# Patient Record
Sex: Female | Born: 1960 | Race: White | Hispanic: No | Marital: Married | Smoking: Never smoker
Health system: Southern US, Community
[De-identification: ages and names within clinical notes are randomized; demographics above are authoritative.]

## PROBLEM LIST (undated history)

## (undated) DIAGNOSIS — I2510 Atherosclerotic heart disease of native coronary artery without angina pectoris: Secondary | ICD-10-CM

## (undated) DIAGNOSIS — R51 Headache: Secondary | ICD-10-CM

## (undated) DIAGNOSIS — D239 Other benign neoplasm of skin, unspecified: Secondary | ICD-10-CM

## (undated) DIAGNOSIS — J329 Chronic sinusitis, unspecified: Secondary | ICD-10-CM

## (undated) DIAGNOSIS — I5022 Chronic systolic (congestive) heart failure: Secondary | ICD-10-CM

## (undated) DIAGNOSIS — F4321 Adjustment disorder with depressed mood: Secondary | ICD-10-CM

## (undated) DIAGNOSIS — E782 Mixed hyperlipidemia: Secondary | ICD-10-CM

## (undated) DIAGNOSIS — K219 Gastro-esophageal reflux disease without esophagitis: Secondary | ICD-10-CM

## (undated) DIAGNOSIS — I10 Essential (primary) hypertension: Secondary | ICD-10-CM

## (undated) DIAGNOSIS — E668 Other obesity: Secondary | ICD-10-CM

## (undated) DIAGNOSIS — M545 Low back pain: Secondary | ICD-10-CM

## (undated) DIAGNOSIS — G4762 Sleep related leg cramps: Secondary | ICD-10-CM

## (undated) DIAGNOSIS — I428 Other cardiomyopathies: Secondary | ICD-10-CM

## (undated) HISTORY — DX: Gastro-esophageal reflux disease without esophagitis: K21.9

## (undated) HISTORY — DX: Atherosclerotic heart disease of native coronary artery without angina pectoris: I25.10

## (undated) HISTORY — DX: Low back pain: M54.5

## (undated) HISTORY — DX: Other obesity: E66.8

## (undated) HISTORY — DX: Headache: R51

## (undated) HISTORY — DX: Sleep related leg cramps: G47.62

## (undated) HISTORY — DX: Mixed hyperlipidemia: E78.2

## (undated) HISTORY — DX: Chronic systolic (congestive) heart failure: I50.22

## (undated) HISTORY — DX: Other benign neoplasm of skin, unspecified: D23.9

## (undated) HISTORY — PX: ABDOMINAL HYSTERECTOMY: SHX81

## (undated) HISTORY — DX: Other cardiomyopathies: I42.8

## (undated) HISTORY — DX: Adjustment disorder with depressed mood: F43.21

## (undated) HISTORY — DX: Chronic sinusitis, unspecified: J32.9

---
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
## 1998-10-05 ENCOUNTER — Encounter: Payer: Self-pay | Admitting: Physical Medicine and Rehabilitation

## 1998-10-05 ENCOUNTER — Ambulatory Visit (HOSPITAL_COMMUNITY)
Admission: RE | Admit: 1998-10-05 | Discharge: 1998-10-05 | Payer: Self-pay | Admitting: Physical Medicine and Rehabilitation

## 2003-04-23 ENCOUNTER — Other Ambulatory Visit: Payer: Self-pay

## 2006-02-10 DIAGNOSIS — E669 Obesity, unspecified: Secondary | ICD-10-CM

## 2006-02-10 DIAGNOSIS — R519 Headache, unspecified: Secondary | ICD-10-CM

## 2006-02-10 DIAGNOSIS — E668 Other obesity: Secondary | ICD-10-CM

## 2006-02-10 HISTORY — DX: Obesity, unspecified: E66.9

## 2006-02-10 HISTORY — DX: Other obesity: E66.8

## 2006-02-10 HISTORY — DX: Headache, unspecified: R51.9

## 2006-06-09 ENCOUNTER — Other Ambulatory Visit: Payer: Self-pay

## 2006-06-09 ENCOUNTER — Emergency Department: Payer: Self-pay | Admitting: Emergency Medicine

## 2006-11-14 DIAGNOSIS — D239 Other benign neoplasm of skin, unspecified: Secondary | ICD-10-CM

## 2006-11-14 DIAGNOSIS — F4321 Adjustment disorder with depressed mood: Secondary | ICD-10-CM

## 2006-11-14 HISTORY — DX: Other benign neoplasm of skin, unspecified: D23.9

## 2006-11-14 HISTORY — DX: Adjustment disorder with depressed mood: F43.21

## 2007-01-15 DIAGNOSIS — M545 Low back pain, unspecified: Secondary | ICD-10-CM

## 2007-01-15 HISTORY — DX: Low back pain, unspecified: M54.50

## 2007-05-24 ENCOUNTER — Ambulatory Visit: Payer: Self-pay

## 2008-12-24 ENCOUNTER — Emergency Department: Payer: Self-pay | Admitting: Emergency Medicine

## 2009-01-14 LAB — LIPID PANEL
Cholesterol: 182 mg/dL (ref 0–200)
HDL: 70 mg/dL (ref 35–70)
LDL Cholesterol: 94 mg/dL
Triglycerides: 89 mg/dL (ref 40–160)

## 2009-01-14 LAB — TSH: TSH: 1.03 u[IU]/mL (ref <=5.90)

## 2009-01-14 LAB — HEMOGLOBIN A1C: Hgb A1c MFr Bld: 5.7 % (ref 4.0–6.0)

## 2010-12-14 LAB — BASIC METABOLIC PANEL
BUN: 17 mg/dL (ref 4–21)
Creatinine: 0.7 mg/dL (ref 0.5–1.1)
GLUCOSE: 87 mg/dL
Potassium: 4 mmol/L (ref 3.4–5.3)
SODIUM: 141 mmol/L (ref 137–147)

## 2010-12-14 LAB — CBC AND DIFFERENTIAL
HEMATOCRIT: 39 % (ref 36–46)
Hemoglobin: 12.9 g/dL (ref 12.0–16.0)
NEUTROS ABS: 5 /uL
Platelets: 249 10*3/uL (ref 150–399)
WBC: 9.2 10*3/mL

## 2011-09-08 ENCOUNTER — Ambulatory Visit: Payer: Self-pay | Admitting: Family Medicine

## 2012-05-07 ENCOUNTER — Emergency Department: Payer: Self-pay | Admitting: Emergency Medicine

## 2012-10-17 ENCOUNTER — Other Ambulatory Visit: Payer: Self-pay

## 2012-10-17 ENCOUNTER — Emergency Department (HOSPITAL_COMMUNITY)
Admission: EM | Admit: 2012-10-17 | Discharge: 2012-10-17 | Disposition: A | Discharge status: Home / Self Care | Payer: Self-pay | Attending: Emergency Medicine | Admitting: Emergency Medicine

## 2012-10-17 ENCOUNTER — Emergency Department (HOSPITAL_COMMUNITY): Payer: Self-pay

## 2012-10-17 ENCOUNTER — Encounter (HOSPITAL_COMMUNITY): Payer: Self-pay | Admitting: Emergency Medicine

## 2012-10-17 DIAGNOSIS — R0789 Other chest pain: Secondary | ICD-10-CM | POA: Insufficient documentation

## 2012-10-17 DIAGNOSIS — R51 Headache: Secondary | ICD-10-CM | POA: Insufficient documentation

## 2012-10-17 LAB — BASIC METABOLIC PANEL
BUN: 20 mg/dL (ref 6–23)
CO2: 27 mEq/L (ref 19–32)
Calcium: 9.4 mg/dL (ref 8.4–10.5)
Chloride: 101 mEq/L (ref 96–112)
Creatinine, Ser: 0.66 mg/dL (ref 0.50–1.10)
GFR calc Af Amer: >90 mL/min (ref >90)
GFR calc non Af Amer: >90 mL/min (ref >90)
Glucose, Bld: 94 mg/dL (ref 70–99)
Potassium: 3.5 mEq/L (ref 3.5–5.1)
Sodium: 137 mEq/L (ref 135–145)

## 2012-10-17 LAB — URINALYSIS, ROUTINE W REFLEX MICROSCOPIC
Bilirubin Urine: NEGATIVE
Glucose, UA: NEGATIVE mg/dL
Hgb urine dipstick: NEGATIVE
Ketones, ur: NEGATIVE mg/dL
Nitrite: NEGATIVE
Protein, ur: NEGATIVE mg/dL
Specific Gravity, Urine: >1.03 — ABNORMAL HIGH (ref 1.005–1.030)
Urobilinogen, UA: 0.2 mg/dL (ref 0.0–1.0)

## 2012-10-17 LAB — CBC WITH DIFFERENTIAL/PLATELET
Basophils Absolute: 0 10*3/uL (ref 0.0–0.1)
Basophils Relative: 0 % (ref 0–1)
Eosinophils Absolute: 0.2 10*3/uL (ref 0.0–0.7)
Eosinophils Relative: 2 % (ref 0–5)
HCT: 36.9 % (ref 36.0–46.0)
Hemoglobin: 12.2 g/dL (ref 12.0–15.0)
Lymphocytes Relative: 35 % (ref 12–46)
Lymphs Abs: 4.1 10*3/uL — ABNORMAL HIGH (ref 0.7–4.0)
MCH: 28.4 pg (ref 26.0–34.0)
MCHC: 33.1 g/dL (ref 30.0–36.0)
MCV: 86 fL (ref 78.0–100.0)
Monocytes Absolute: 0.8 10*3/uL (ref 0.1–1.0)
Monocytes Relative: 7 % (ref 3–12)
Neutro Abs: 6.5 10*3/uL (ref 1.7–7.7)
Neutrophils Relative %: 56 % (ref 43–77)
Platelets: 226 10*3/uL (ref 150–400)
RBC: 4.29 MIL/uL (ref 3.87–5.11)
RDW: 13.3 % (ref 11.5–15.5)
WBC: 11.6 10*3/uL — ABNORMAL HIGH (ref 4.0–10.5)

## 2012-10-17 LAB — TROPONIN I: Troponin I: <0.3 ng/mL (ref <0.30)

## 2012-10-17 MED ORDER — ACETAMINOPHEN 500 MG PO TABS
1000.0000 mg | ORAL_TABLET | Freq: Once | ORAL | Status: AC
  Administered 2012-10-17: 1000 mg via ORAL
  Filled 2012-10-17: qty 2

## 2012-10-17 MED ORDER — IBUPROFEN 800 MG PO TABS
800.0000 mg | ORAL_TABLET | Freq: Once | ORAL | Status: AC
  Administered 2012-10-17: 800 mg via ORAL
  Filled 2012-10-17: qty 1

## 2012-10-17 MED ORDER — HYDROCHLOROTHIAZIDE 25 MG PO TABS: 25.0000 mg | ORAL_TABLET | Freq: Every day | ORAL | Status: DC

## 2012-10-17 MED ORDER — AMOXICILLIN-POT CLAVULANATE 875-125 MG PO TABS: 1.0000 | ORAL_TABLET | Freq: Two times a day (BID) | ORAL | Status: DC

## 2012-10-17 NOTE — ED Provider Notes (Signed)
CSN: [id number]     Arrival date & time 10/17/12  1757 History   First MD Initiated Contact with Patient 10/17/12 1821     Chief Complaint  Patient presents with  . Hypertension  . Chest Pain  . facial tingling    (Consider location/radiation/quality/duration/timing/severity/associated sxs/prior Treatment) HPI... intermittent chest tightness, facial tingling the past 2 weeks. Episodes are brief and not associated with dyspnea, diaphoresis, nausea. Patient is morbidly obese. She claims to have no chronic health problems other than her weight.  Nonsmoker.  No family history of early cardiac death. Blood pressure elevated today.  History reviewed. No pertinent past medical history. Past Surgical History  Procedure Laterality Date  . Abdominal hysterectomy     No family history on file. History  Substance Use Topics  . Smoking status: Never Smoker   . Smokeless tobacco: Not on file  . Alcohol Use: Yes     Comment: rarely   OB History   Grav Para Term Preterm Abortions TAB SAB Ect Mult Living                 Review of Systems  All other systems reviewed and are negative.    Allergies  Review of patient's allergies indicates no known allergies.  Home Medications   Current Outpatient Rx  Name  Route  Sig  Dispense  Refill  . ibuprofen (ADVIL,MOTRIN) 800 MG tablet   Oral   Take 800 mg by mouth every 8 (eight) hours as needed for pain.          BP 168/69  Pulse 63  Resp 18  SpO2 96% Physical Exam  Nursing note and vitals reviewed. Constitutional: She is oriented to person, place, and time.  Morbidly obese  HENT:  Head: Normocephalic and atraumatic.  Eyes: Conjunctivae and EOM are normal. Pupils are equal, round, and reactive to light.  Neck: Normal range of motion. Neck supple.  Cardiovascular: Normal rate, regular rhythm and normal heart sounds.   Pulmonary/Chest: Effort normal and breath sounds normal.  Abdominal: Soft. Bowel sounds are normal.   Musculoskeletal: Normal range of motion.  Neurological: She is alert and oriented to person, place, and time.  Skin: Skin is warm and dry.  Psychiatric: She has a normal mood and affect.    ED Course  Procedures (including critical care time) Labs Review Labs Reviewed  CBC WITH DIFFERENTIAL - Abnormal; Notable for the following:    WBC 11.6 (*)    Lymphs Abs 4.1 (*)    All other components within normal limits  URINALYSIS, ROUTINE W REFLEX MICROSCOPIC - Abnormal; Notable for the following:    Specific Gravity, Urine >1.030 (*)    All other components within normal limits  BASIC METABOLIC PANEL  TROPONIN I   Imaging Review No results found.  Date: 10/17/2012  Rate: 81  Rhythm: normal sinus rhythm  QRS Axis: normal  Intervals: normal  ST/T Wave abnormalities: normal  Conduction Disutrbances: none  Narrative Interpretation: unremarkable    MDM  No diagnosis found. Patient is pain-free in the emergency department. No neuro deficits. EKG normal. First troponin negative. Blood pressure has reduced.  Patient has primary care followup.    Donnetta Hutching, MD 10/19/12 2249

## 2012-10-17 NOTE — ED Notes (Signed)
Pt BP is much better here in the ED, pt states she drank vinegar before coming in.

## 2012-10-17 NOTE — ED Notes (Signed)
Patient given discharge instruction, Patient ambulatory out of the department.

## 2012-10-17 NOTE — ED Notes (Signed)
Pt also complaining of urine frequency and odor, Patient ambulatory to restroom with steady gait, clean catch instructions given and advised pt to bring specimen back to room as well.

## 2012-10-17 NOTE — ED Notes (Signed)
Pt has been upset with son this week, notice facial tingling and some chest tighness over the last 2 weeks, worse today. Had BP checked 180/100, denies, nausea, or SOB

## 2012-10-17 NOTE — ED Notes (Signed)
Pt complaining of frequency, wants urine checked, MD advised, orders given.

## 2012-11-11 ENCOUNTER — Emergency Department: Payer: Self-pay | Admitting: Emergency Medicine

## 2012-11-11 LAB — CBC
HCT: 38.1 % (ref 35.0–47.0)
MCH: 28.8 pg (ref 26.0–34.0)
MCHC: 34.7 g/dL (ref 32.0–36.0)
MCV: 83 fL (ref 80–100)
Platelet: 231 10*3/uL (ref 150–440)
RBC: 4.58 10*6/uL (ref 3.80–5.20)
RDW: 13.6 % (ref 11.5–14.5)
WBC: 15.6 10*3/uL — ABNORMAL HIGH (ref 3.6–11.0)

## 2012-11-11 LAB — BASIC METABOLIC PANEL
Anion Gap: 3 — ABNORMAL LOW (ref 7–16)
Chloride: 107 mmol/L (ref 98–107)
Co2: 28 mmol/L (ref 21–32)
EGFR (African American): >60
EGFR (Non-African Amer.): >60
Glucose: 98 mg/dL (ref 65–99)
Osmolality: 276 (ref 275–301)
Sodium: 138 mmol/L (ref 136–145)

## 2012-11-11 LAB — CK TOTAL AND CKMB (NOT AT ARMC)
CK, Total: 169 U/L (ref 21–215)
CK-MB: 1.8 ng/mL (ref 0.5–3.6)

## 2012-11-11 LAB — TROPONIN I: Troponin-I: <0.02 ng/mL

## 2013-07-30 ENCOUNTER — Emergency Department: Payer: Self-pay | Admitting: Internal Medicine

## 2013-07-30 LAB — URINALYSIS, COMPLETE
BACTERIA: NONE SEEN
BLOOD: NEGATIVE
Bilirubin,UR: NEGATIVE
Glucose,UR: NEGATIVE mg/dL (ref 0–75)
Ketone: NEGATIVE
LEUKOCYTE ESTERASE: NEGATIVE
Nitrite: NEGATIVE
PROTEIN: NEGATIVE
Ph: 6 (ref 4.5–8.0)
RBC,UR: NONE SEEN /HPF (ref 0–5)
Specific Gravity: 1.02 (ref 1.003–1.030)
Squamous Epithelial: 3

## 2013-07-30 LAB — COMPREHENSIVE METABOLIC PANEL
Albumin: 3.3 g/dL — ABNORMAL LOW (ref 3.4–5.0)
Alkaline Phosphatase: 80 U/L
Anion Gap: 6 — ABNORMAL LOW (ref 7–16)
BUN: 17 mg/dL (ref 7–18)
Bilirubin,Total: 0.5 mg/dL (ref 0.2–1.0)
CO2: 27 mmol/L (ref 21–32)
Calcium, Total: 8.5 mg/dL (ref 8.5–10.1)
Chloride: 107 mmol/L (ref 98–107)
Creatinine: 0.67 mg/dL (ref 0.60–1.30)
EGFR (African American): >60
Glucose: 93 mg/dL (ref 65–99)
Osmolality: 281 (ref 275–301)
Potassium: 3.7 mmol/L (ref 3.5–5.1)
SGOT(AST): 22 U/L (ref 15–37)
SGPT (ALT): 27 U/L (ref 12–78)
Sodium: 140 mmol/L (ref 136–145)
Total Protein: 7.2 g/dL (ref 6.4–8.2)

## 2013-07-30 LAB — CBC
HCT: 37.3 % (ref 35.0–47.0)
HGB: 12.3 g/dL (ref 12.0–16.0)
MCH: 28.2 pg (ref 26.0–34.0)
MCHC: 33 g/dL (ref 32.0–36.0)
MCV: 85 fL (ref 80–100)
Platelet: 214 10*3/uL (ref 150–440)
RBC: 4.37 10*6/uL (ref 3.80–5.20)
RDW: 13.4 % (ref 11.5–14.5)
WBC: 10.6 10*3/uL (ref 3.6–11.0)

## 2014-08-09 ENCOUNTER — Other Ambulatory Visit: Payer: Self-pay | Admitting: Family Medicine

## 2014-08-11 ENCOUNTER — Telehealth: Payer: Self-pay | Admitting: Family Medicine

## 2014-08-11 NOTE — Telephone Encounter (Signed)
Pt contacted office for refill request on the following medications:  ibuprofen (ADVIL,MOTRIN) 800 MG and Orphenadrine 100mg .  [place]  [phone]

## 2014-09-09 ENCOUNTER — Ambulatory Visit (INDEPENDENT_AMBULATORY_CARE_PROVIDER_SITE_OTHER): Payer: 59 | Admitting: Family Medicine

## 2014-09-09 ENCOUNTER — Encounter: Payer: Self-pay | Admitting: Family Medicine

## 2014-09-09 ENCOUNTER — Telehealth: Payer: Self-pay | Admitting: Family Medicine

## 2014-09-09 VITALS — BP 138/96 | HR 100 | Temp 97.6°F | Resp 18 | Wt 340.0 lb

## 2014-09-09 DIAGNOSIS — R350 Frequency of micturition: Secondary | ICD-10-CM

## 2014-09-09 DIAGNOSIS — E668 Other obesity: Secondary | ICD-10-CM

## 2014-09-09 DIAGNOSIS — K219 Gastro-esophageal reflux disease without esophagitis: Secondary | ICD-10-CM

## 2014-09-09 DIAGNOSIS — M545 Low back pain: Secondary | ICD-10-CM

## 2014-09-09 DIAGNOSIS — G4762 Sleep related leg cramps: Secondary | ICD-10-CM | POA: Insufficient documentation

## 2014-09-09 DIAGNOSIS — J329 Chronic sinusitis, unspecified: Secondary | ICD-10-CM

## 2014-09-09 DIAGNOSIS — R030 Elevated blood-pressure reading, without diagnosis of hypertension: Secondary | ICD-10-CM

## 2014-09-09 DIAGNOSIS — I10 Essential (primary) hypertension: Secondary | ICD-10-CM | POA: Insufficient documentation

## 2014-09-09 HISTORY — DX: Gastro-esophageal reflux disease without esophagitis: K21.9

## 2014-09-09 HISTORY — DX: Chronic sinusitis, unspecified: J32.9

## 2014-09-09 HISTORY — DX: Sleep related leg cramps: G47.62

## 2014-09-09 LAB — POCT URINALYSIS DIPSTICK
Bilirubin, UA: NEGATIVE
Blood, UA: NEGATIVE
Glucose, UA: NEGATIVE
KETONES UA: NEGATIVE
Leukocytes, UA: NEGATIVE
Nitrite, UA: NEGATIVE
PROTEIN UA: NEGATIVE
Spec Grav, UA: 1.02
UROBILINOGEN UA: 0.2
pH, UA: 6

## 2014-09-09 MED ORDER — ORPHENADRINE CITRATE ER 100 MG PO TB12: 100.0000 mg | ORAL_TABLET | Freq: Two times a day (BID) | ORAL | Status: DC | PRN

## 2014-09-09 MED ORDER — NITROFURANTOIN MONOHYD MACRO 100 MG PO CAPS: 100.0000 mg | ORAL_CAPSULE | Freq: Two times a day (BID) | ORAL | Status: DC

## 2014-09-09 NOTE — Progress Notes (Signed)
Subjective:    Patient ID: Shannon Small, female    DOB: [birth year], 54 y.o.   MRN: [id number] Chief Complaint  Patient presents with  . Hypertension    X 1 week  . Dizziness  . Nausea  . Back Pain   HPI This 54 year old female has had "odor" with urination with increase frequent and even 5-6 times a night with urgency over the past several months. Increase thirst the past few days. Family history of mother having diabetes. Recent ticklish cough with pressure in right ear and sinuses. Has a history of chronic sinusitis with acid reflux. Some back pain/soreness with recent urinary frequency and stress incontinence (leak with cough).  Patient Active Problem List   Diagnosis Date Noted  . Chronic infection of sinus 09/09/2014  . Blood pressure elevated without history of HTN 09/09/2014  . Acid reflux 09/09/2014  . Sleep related leg cramps 09/09/2014  . LBP (low back pain) 01/15/2007  . Adjustment disorder with depressed mood 11/14/2006  . Benign neoplasm of skin 11/14/2006  . Cephalalgia 02/10/2006  . Extreme obesity 02/10/2006   Past Surgical History  Procedure Laterality Date  . Abdominal hysterectomy    . Rhinoplasty  1982   Family History  Problem Relation Age of Onset  . Lung cancer Mother   . Breast cancer Mother   . Diabetes Mother   . Stomach cancer Father   . Alcohol abuse Brother   . Diabetes Maternal Grandmother   . Stroke Maternal Grandmother   . Stroke Maternal Grandfather   . Heart disease Paternal Grandmother   . Stroke Paternal Grandfather    Current Outpatient Prescriptions on File Prior to Visit  Medication Sig Dispense Refill  . albuterol (PROAIR HFA) 108 (90 BASE) MCG/ACT inhaler Inhale into the lungs.    . hydrochlorothiazide (HYDRODIURIL) 25 MG tablet Take 1 tablet (25 mg total) by mouth daily. 30 tablet 0  . ibuprofen (ADVIL,MOTRIN) 800 MG tablet TAKE ONE TABLET BY MOUTH THREE TIMES DAILY 90 tablet 0  . orphenadrine (NORFLEX) 100 MG tablet Take  by mouth.    . ranitidine (ZANTAC) 150 MG tablet Take by mouth.     No current facility-administered medications on file prior to visit.   No Known Allergies Social History  Substance Use Topics  . Smoking status: Never Smoker   . Smokeless tobacco: Never Used  . Alcohol Use: 0.0 oz/week    0 Standard drinks or equivalent per week     Comment: rarely    No Known Allergies   Review of Systems  Constitutional: Negative.  Negative for fever.  HENT: Positive for ear pain, postnasal drip, sinus pressure and sneezing.        Pressure sensation in the right ear and maxillary sinus with sneezing.   Eyes: Negative.   Respiratory: Negative.   Cardiovascular: Negative.   Gastrointestinal: Positive for nausea.       No vomiting or diarrhea.  Endocrine: Positive for polydipsia.  Genitourinary: Positive for frequency.  Musculoskeletal: Positive for back pain.      BP 138/96 mmHg  Pulse 100  Temp(Src) 97.6 F (36.4 C) (Oral)  Resp 18  Wt 340 lb (154.223 kg)  SpO2 97%   Objective:   Physical Exam  Constitutional: She is oriented to person, place, and time. She appears well-developed.  Morbid obesity  HENT:  Head: Normocephalic and atraumatic.  Eyes: Conjunctivae are normal.  Neck: Neck supple.  Cardiovascular: Normal rate and regular rhythm.  Pulmonary/Chest: Effort normal and breath sounds normal.  Abdominal: Soft. Bowel sounds are normal.  Questionable suprapubic discomfort to palpate. Difficult to assess with severe obesity.  Musculoskeletal: She exhibits tenderness.  Posterior back with questionable CVA discomfort to percussion.   Neurological: She is oriented to person, place, and time.  Skin: Skin is warm and dry.  Psychiatric: She has a normal mood and affect. Her behavior is normal.      Assessment & Plan:  1. Low back pain without sciatica, unspecified back pain laterality Difficult to assess with her severe obesity. May be chronic strain versus UTI. May use  Ibuprofen 200 mg 4 tablets TID and Orphenadrine for muscles spasms and stress incontinence. Increase fluid intake. - POCT urinalysis dipstick  2. Urinary frequency Onset over the past week with foul odor to urine, subjectively. Urinalysis not showing significant signs of infection. Patient worried this could be the start of an infection. Will get routine labs to rule out DM and given Macrobid for prophylactic treatment. - nitrofurantoin, macrocrystal-monohydrate, (MACROBID) 100 MG capsule; Take 1 capsule (100 mg total) by mouth 2 (two) times daily.  Dispense: 20 capsule; Refill: 0 - COMPLETE METABOLIC PANEL WITH GFR - CBC with Differential/Platelet  3. Blood pressure elevated without history of HTN  Mild elevation of BP. No chest pain, dyspnea, diaphoresis or dizziness. Should limit sodium intake and must lose weight.  4. Extreme obesity Years of severe obesity probably causing some low back and knee pains. Will get routine labs to rule out metabolic disorder. Stressed need to exercise (water aerobics should limit impact on back and joints) and lower calories in diet. Recheck pending reports. - COMPLETE METABOLIC PANEL WITH GFR - CBC with Differential/Platelet

## 2014-09-09 NOTE — Telephone Encounter (Signed)
Pt called early this am for lower back pain and was suggested to come in today, pt refused and stated that she wanted to come on Thursday Sep 1st at 10:00 am. Pt called back and stated that her B/p is 180/90 because her pain has gotten worst and that she wanted to be seen today if possible.  Talked to [employer] and he okayed for her to come in today. Scheduled her an appointment today at 04:30 pm.  Thanks,

## 2014-09-11 ENCOUNTER — Ambulatory Visit: Payer: Self-pay | Admitting: Family Medicine

## 2014-09-11 ENCOUNTER — Telehealth: Payer: Self-pay | Admitting: Family Medicine

## 2014-09-11 NOTE — Telephone Encounter (Signed)
Pt is request a generic Rx/MW

## 2014-09-11 NOTE — Telephone Encounter (Signed)
Pt states she was in Tuesday and rec'd a Rx for orphenadrine (NORFLEX) 100 MG.  Pt states these are not helping her back pain.  Pt is requesting a different Rx for her back.  [place]  [phone]

## 2014-09-12 MED ORDER — METHOCARBAMOL 750 MG PO TABS: 750.0000 mg | ORAL_TABLET | Freq: Four times a day (QID) | ORAL | Status: DC

## 2014-09-12 NOTE — Telephone Encounter (Signed)
Can change to a different back pain medication (Methocarbamol sent to [facility]). Must not take it with the Norflex. Continue the Ibuprofen 200 mg 4 tablets TID with meals. Apply moist heat to back.

## 2014-09-12 NOTE — Telephone Encounter (Signed)
Pt is calling again to see if Rx has been sent in.  RP#[phone]/[id number]

## 2014-09-12 NOTE — Telephone Encounter (Signed)
Please send in a different medication for back pain. Thanks!

## 2014-09-16 NOTE — Telephone Encounter (Signed)
Patient advised as directed below. Per patient if not able to take Norflex with the new medicine. Is the new medicine going to work with her leg cramps?(Methocarbamol) Please Advise.  Thanks,   -Taevon Aschoff

## 2014-09-16 NOTE — Telephone Encounter (Signed)
Would expect it to work for muscle cramps.

## 2014-09-17 NOTE — Telephone Encounter (Signed)
Pt advised as directed below.  Thanks,   -Clarance Bollard 

## 2014-10-29 ENCOUNTER — Telehealth: Payer: Self-pay | Admitting: Family Medicine

## 2014-10-29 DIAGNOSIS — M545 Low back pain: Secondary | ICD-10-CM

## 2014-10-29 NOTE — Telephone Encounter (Signed)
Pt needs refill methocarbamol (ROBAXIN) 750 MG tablet  [facility] [address]  Thanks [employer]

## 2014-10-30 MED ORDER — METHOCARBAMOL 750 MG PO TABS: 750.0000 mg | ORAL_TABLET | Freq: Four times a day (QID) | ORAL | Status: DC

## 2014-10-30 NOTE — Telephone Encounter (Signed)
Patient advised as directed below. Patient verbalized understanding. Patient states she is taking the Methocarbamol for leg cramps not back pain, and the leg cramps is a chronic issue.

## 2014-10-30 NOTE — Telephone Encounter (Signed)
Advise patient refill of methocarbamol sent to her pharmacy. Recheck if no better in a week.

## 2014-12-01 ENCOUNTER — Telehealth: Payer: Self-pay | Admitting: Family Medicine

## 2014-12-01 NOTE — Telephone Encounter (Signed)
Attempted to contact patient. No answer nor voicemail.   Patient will need a OV for cough.

## 2014-12-01 NOTE — Telephone Encounter (Signed)
Pt would like for someone to call her back.  She has developed a cough and has taken otc drugs but nothing seems to be helping.  Her call back is  [phone]  Thanks [employer]

## 2014-12-11 ENCOUNTER — Telehealth: Payer: Self-pay | Admitting: Family Medicine

## 2014-12-11 NOTE — Telephone Encounter (Signed)
Pt contacted office for refill request on the following medications:  ibuprofen (ADVIL,MOTRIN) 800 MG tablet.  [facility] [address]  CB#[phone]/MW °

## 2014-12-12 MED ORDER — IBUPROFEN 800 MG PO TABS: 800.0000 mg | ORAL_TABLET | Freq: Three times a day (TID) | ORAL | Status: DC

## 2014-12-12 NOTE — Telephone Encounter (Signed)
Sent refill of Ibuprofen to the [facility].

## 2015-02-10 ENCOUNTER — Encounter: Payer: Self-pay | Admitting: Family Medicine

## 2015-02-17 ENCOUNTER — Encounter: Payer: Self-pay | Admitting: Family Medicine

## 2015-02-17 ENCOUNTER — Ambulatory Visit (INDEPENDENT_AMBULATORY_CARE_PROVIDER_SITE_OTHER): Payer: 59 | Admitting: Family Medicine

## 2015-02-17 ENCOUNTER — Telehealth: Payer: Self-pay | Admitting: Family Medicine

## 2015-02-17 VITALS — BP 142/84 | HR 84 | Temp 98.9°F | Resp 16 | Ht 64.5 in | Wt 354.8 lb

## 2015-02-17 DIAGNOSIS — L309 Dermatitis, unspecified: Secondary | ICD-10-CM

## 2015-02-17 DIAGNOSIS — J329 Chronic sinusitis, unspecified: Secondary | ICD-10-CM

## 2015-02-17 DIAGNOSIS — N7689 Other specified inflammation of vagina and vulva: Secondary | ICD-10-CM

## 2015-02-17 DIAGNOSIS — N3946 Mixed incontinence: Secondary | ICD-10-CM

## 2015-02-17 DIAGNOSIS — J040 Acute laryngitis: Secondary | ICD-10-CM

## 2015-02-17 LAB — POCT URINALYSIS DIPSTICK
BILIRUBIN UA: NEGATIVE
Clarity, UA: NEGATIVE
Glucose, UA: NEGATIVE
KETONES UA: NEGATIVE
Leukocytes, UA: NEGATIVE
NITRITE UA: NEGATIVE
Protein, UA: NEGATIVE
RBC UA: NEGATIVE
Spec Grav, UA: 1.02
Urobilinogen, UA: 1
pH, UA: 6.5

## 2015-02-17 MED ORDER — CLOTRIMAZOLE-BETAMETHASONE 1-0.05 % EX LOTN: TOPICAL_LOTION | Freq: Two times a day (BID) | CUTANEOUS | Status: DC

## 2015-02-17 MED ORDER — NYSTATIN 100000 UNIT/GM EX POWD: CUTANEOUS | Status: DC

## 2015-02-17 MED ORDER — FLUTICASONE PROPIONATE 50 MCG/ACT NA SUSP: 2.0000 | Freq: Every day | NASAL | Status: DC

## 2015-02-17 MED ORDER — PROMETHAZINE-DM 6.25-15 MG/5ML PO SYRP: 5.0000 mL | ORAL_SOLUTION | Freq: Four times a day (QID) | ORAL | Status: DC | PRN

## 2015-02-17 MED ORDER — AMOXICILLIN 500 MG PO CAPS: 500.0000 mg | ORAL_CAPSULE | Freq: Three times a day (TID) | ORAL | Status: DC

## 2015-02-17 NOTE — Telephone Encounter (Signed)
Pt stated that clotrimazole-betamethasone (LOTRISONE) lotion isn't covered by her insurance and she can not afford to pay out of pocket. Pt stated she would like something called in today that her insurance will cover. [facility]. Thanks TNP

## 2015-02-17 NOTE — Telephone Encounter (Signed)
Pt is called again/MW

## 2015-02-17 NOTE — Telephone Encounter (Signed)
Changed to Nystatin powder today. Recheck prn.

## 2015-02-17 NOTE — Progress Notes (Signed)
Patient ID: Shannon Small, female   DOB: 05/05/1960, 55 y.o.   MRN: [id number]       Patient: Shannon Small, Female    DOB: 17-Jan-1960, 55 y.o.   MRN: [id number] Visit Date: 02/17/2015  Today's Provider: Vernie Murders, PA   Chief Complaint  Patient presents with  . Cough and congestion        Vaginal odor  Subjective:  Cough This 55 year old female developed sneezing and rhinorrhea 1 week ago. This progressed to hacking cough, body aches, sore throat, and laryngitis the past few days. Has not tried any OTC treatments. Some morning yellow to brown sputum with some blood in nasal mucus. Coughing causes bladder leakage and this has lead to a vaginal odor (no vaginal discharge) with some itching.  -----------------------------------------------------------------   Review of Systems  Constitutional: Positive for fever, chills and appetite change.  HENT: Positive for ear pain, rhinorrhea, sinus pressure, sneezing, sore throat, trouble swallowing and voice change.   Eyes: Negative.   Respiratory: Positive for cough, chest tightness, shortness of breath and wheezing.   Cardiovascular: Positive for chest pain and leg swelling.  Gastrointestinal: Positive for abdominal pain.  Endocrine: Negative.   Genitourinary: Positive for frequency, vaginal discharge, enuresis and vaginal pain.  Musculoskeletal: Positive for myalgias, joint swelling, arthralgias and neck pain.  Skin: Negative.   Allergic/Immunologic: Negative.   Neurological: Positive for headaches.  Hematological: Negative.   Psychiatric/Behavioral: Negative.     Social History      She  reports that she has never smoked. She has never used smokeless tobacco. She reports that she drinks alcohol. She reports that she does not use illicit drugs.       Social History   Social History  . Marital Status: Married    Spouse Name: N/A  . Number of Children: N/A  . Years of Education: N/A   Social History Main Topics  . Smoking  status: Never Smoker   . Smokeless tobacco: Never Used  . Alcohol Use: 0.0 oz/week    0 Standard drinks or equivalent per week     Comment: rarely  . Drug Use: No  . Sexual Activity: Not Asked   Other Topics Concern  . None   Social History Narrative    Patient Active Problem List   Diagnosis Date Noted  . Chronic infection of sinus 09/09/2014  . Blood pressure elevated without history of HTN 09/09/2014  . Acid reflux 09/09/2014  . Sleep related leg cramps 09/09/2014  . LBP (low back pain) 01/15/2007  . Adjustment disorder with depressed mood 11/14/2006  . Benign neoplasm of skin 11/14/2006  . Cephalalgia 02/10/2006  . Extreme obesity ([place]) 02/10/2006    Past Surgical History  Procedure Laterality Date  . Abdominal hysterectomy    . Rhinoplasty  1982    Family History        Family Status  Relation Status Death Age  . Father Deceased 61  . Maternal Grandmother Deceased   . Maternal Grandfather Deceased   . Paternal Grandmother Deceased   . Paternal Grandfather Deceased         Her family history includes Alcohol abuse in her brother; Breast cancer in her mother; Diabetes in her maternal grandmother and mother; Heart disease in her paternal grandmother; Lung cancer in her mother; Stomach cancer in her father; Stroke in her maternal grandfather, maternal grandmother, and paternal grandfather.    No Known Allergies  Previous Medications   ALBUTEROL (PROAIR HFA) 108 (  90 BASE) MCG/ACT INHALER    Inhale into the lungs.   HYDROCHLOROTHIAZIDE (HYDRODIURIL) 25 MG TABLET    Take 1 tablet (25 mg total) by mouth daily.   IBUPROFEN (ADVIL,MOTRIN) 800 MG TABLET    Take 1 tablet (800 mg total) by mouth 3 (three) times daily.   METHOCARBAMOL (ROBAXIN) 750 MG TABLET    Take 1 tablet (750 mg total) by mouth 4 (four) times daily.   RANITIDINE (ZANTAC) 150 MG TABLET    Take by mouth. Reported on 02/17/2015    Patient Care Team: Margo Common, PA as PCP - General (Physician  Assistant)     Objective:   Vitals: BP 142/84 mmHg  Pulse 84  Temp(Src) 98.9 F (37.2 C) (Oral)  Resp 16  Ht 5' 4.5" (1.638 m)  Wt 354 lb 12.8 oz (160.936 kg)  BMI 59.98 kg/m2   Physical Exam  Constitutional:  Morbid obesity.  HENT:  Head: Normocephalic.  Right Ear: External ear normal.  Left Ear: External ear normal.  Mouth/Throat: Oropharynx is clear and moist.  Tender maxillary sinuses to palpate. Red turbinates bilaterally.  Eyes: Conjunctivae and EOM are normal.  Neck: Normal range of motion.  Cardiovascular: Normal rate, regular rhythm and normal heart sounds.   Pulmonary/Chest: Effort normal.  Abdominal: Bowel sounds are normal. She exhibits no mass. There is no guarding.  Difficult to examine due to morbid obesity. Questionable RUQ tenderness.  Genitourinary: Vagina normal. No vaginal discharge found.  Vulvar redness bilaterally and between buttocks. Some pinpoint satellite lesions. Some clear mucus with bladder leakage while moving about on the exam table.  Skin: Rash noted.  Vulvar monilial dermatitis.  Psychiatric: She has a normal mood and affect. Her behavior is normal.   Assessment & Plan:    1. Recurrent sinusitis Started with nasal congestion, sneezing and rhinorrhea a week ago. Progressed to headache, sore throat, cough and laryngitis. Suspect recurrent sinusitis due to allergic rhinitis. Recommend antibiotic for tender maxillary sinuses and use Flonase with antihistamine for allergic rhinitis control to prevent recurrence. Recheck prn.  - amoxicillin (AMOXIL) 500 MG capsule; Take 1 capsule (500 mg total) by mouth 3 (three) times daily.  Dispense: 30 capsule; Refill: 0 - fluticasone (FLONASE) 50 MCG/ACT nasal spray; Place 2 sprays into both nostrils daily.  Dispense: 16 g; Refill: 6  2. Laryngitis Onset the past few days with sinusitis. Weak voice today and having hacking, ticklish cough. Will treat with cough syrup and recheck prn. -  promethazine-dextromethorphan (PROMETHAZINE-DM) 6.25-15 MG/5ML syrup; Take 5 mLs by mouth 4 (four) times daily as needed for cough.  Dispense: 118 mL; Refill: 0  3. Mixed incontinence Has to wear pads daily. 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 No sign of infection on urinalysis. Probably should consider referral to urologist if treatment of cough does not help her regain control.  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 Treat with Nystatin powder and keep area clean and dry. May need referral to dermatologist if unable to control. No sign of vaginal infection on wet prep.  - nystatin (MYCOSTATIN/NYSTOP) 100000 UNIT/GM POWD; Sprinkle powder on vulvar rash morning and bedtime. Keep area clean and dry as possible.  Dispense: 30 g; Refill: 1        Discussed health benefits of physical activity, and encouraged her to engage in regular exercise appropriate for her age and condition.    --------------------------------------------------------------------

## 2015-02-18 NOTE — Telephone Encounter (Signed)
Patient advised as directed below. Patient states the letter you wrote her for work needs to say she can work 1st and 2nd shift. Patient would like a call back when letter is ready for pick up.

## 2015-02-19 ENCOUNTER — Telehealth: Payer: Self-pay | Admitting: Family Medicine

## 2015-02-19 NOTE — Telephone Encounter (Signed)
Advise patient the note was written and placed at the front desk for pick up.

## 2015-02-19 NOTE — Telephone Encounter (Signed)
Pt is requesting a stronger cough medication if possible.  [place]  [phone]

## 2015-02-19 NOTE — Telephone Encounter (Signed)
Please review. Thanks!  

## 2015-02-20 NOTE — Telephone Encounter (Signed)
Patient advised.

## 2015-02-23 ENCOUNTER — Other Ambulatory Visit: Payer: Self-pay | Admitting: Family Medicine

## 2015-02-23 ENCOUNTER — Telehealth: Payer: Self-pay | Admitting: Family Medicine

## 2015-02-23 MED ORDER — BENZONATATE 100 MG PO CAPS: 100.0000 mg | ORAL_CAPSULE | Freq: Three times a day (TID) | ORAL | Status: DC | PRN

## 2015-02-23 MED ORDER — MAGIC MOUTHWASH: ORAL | Status: DC

## 2015-02-23 NOTE — Telephone Encounter (Signed)
Recommend Duke's Magic Mouthwash 3-4 times a day. Sent to pharmacy ([place])

## 2015-02-23 NOTE — Telephone Encounter (Signed)
Patient advised as directed below. 

## 2015-02-23 NOTE — Telephone Encounter (Signed)
Use Benzonatate 100 mg TID prn for cough with then Promethazine-DM.

## 2015-02-23 NOTE — Telephone Encounter (Signed)
Pt states she still has a sore throat and her throat is burning.  Pt is requesting a Rx to help with this.  Pt is requesting a call back.  [place]  [id number]

## 2015-02-23 NOTE — Telephone Encounter (Signed)
Please advise 

## 2015-02-24 ENCOUNTER — Telehealth: Payer: Self-pay | Admitting: Family Medicine

## 2015-02-24 DIAGNOSIS — N3946 Mixed incontinence: Secondary | ICD-10-CM

## 2015-02-24 NOTE — Telephone Encounter (Signed)
Please advise 

## 2015-02-24 NOTE — Telephone Encounter (Signed)
Pt is requesting a referral to see a specialist for her bladder.  [id number]

## 2015-02-26 NOTE — Telephone Encounter (Signed)
Refer to urologist for severe mixed urge and stress incontinence causing vulvar irritation.

## 2015-03-03 ENCOUNTER — Telehealth: Payer: Self-pay | Admitting: Family Medicine

## 2015-03-03 DIAGNOSIS — J040 Acute laryngitis: Secondary | ICD-10-CM

## 2015-03-03 NOTE — Telephone Encounter (Signed)
Please review

## 2015-03-03 NOTE — Telephone Encounter (Signed)
Recommend she schedule appointment to recheck lungs and get spirometry test. May need chest x-ray to rule out pneumonia or bronchitis with asthma. Be sure she is not getting exposure to tobacco smoke or vaping mist (second hand exposure can be as bad as smoking for some people).

## 2015-03-03 NOTE — Telephone Encounter (Signed)
Pt states she is still coughing and has some clear congestion.  Pt is requesting a different Rx.  [place]  [id number]

## 2015-03-04 NOTE — Telephone Encounter (Signed)
Patient advised as directed below. Patient states she "doesn't need to come in for a follow up appointment". Tried to explain to patient that Shannon Small will need to recheck her lungs before any further medication can be given. Patient states very rudely "thats fine, I want be coming back at all"

## 2015-03-04 NOTE — Telephone Encounter (Signed)
Patient called back and states she does not have the money to come back in for a follow up appointment. Patient states she has a nagging cough that is worse in the cold air. Patient is requesting a RX for the cough. Tried to explain to patient once again that Simona Huh recommended that she come back in for a lung recheck, possible CXR, and spirometry. Patient insisted I ask Simona Huh for just the RX due to financial situations.

## 2015-03-05 MED ORDER — PROMETHAZINE-DM 6.25-15 MG/5ML PO SYRP: 5.0000 mL | ORAL_SOLUTION | Freq: Four times a day (QID) | ORAL | Status: DC | PRN

## 2015-03-05 NOTE — Telephone Encounter (Signed)
Shannon Small advised patient as directed below.

## 2015-03-05 NOTE — Telephone Encounter (Signed)
Sent cough syrup to the pharmacy but will have to dismiss from our practice if behavior and follow up doesn't improve.

## 2015-03-06 ENCOUNTER — Ambulatory Visit: Payer: 59 | Admitting: Family Medicine

## 2015-03-11 ENCOUNTER — Encounter: Payer: Self-pay | Admitting: Family Medicine

## 2015-03-11 ENCOUNTER — Ambulatory Visit (INDEPENDENT_AMBULATORY_CARE_PROVIDER_SITE_OTHER): Payer: 59 | Admitting: Family Medicine

## 2015-03-11 VITALS — BP 150/90 | HR 91 | Temp 98.0°F | Resp 16

## 2015-03-11 DIAGNOSIS — J40 Bronchitis, not specified as acute or chronic: Secondary | ICD-10-CM

## 2015-03-11 DIAGNOSIS — R05 Cough: Secondary | ICD-10-CM

## 2015-03-11 DIAGNOSIS — R059 Cough, unspecified: Secondary | ICD-10-CM

## 2015-03-11 MED ORDER — MOMETASONE FURO-FORMOTEROL FUM 100-5 MCG/ACT IN AERO: 2.0000 | INHALATION_SPRAY | Freq: Two times a day (BID) | RESPIRATORY_TRACT | Status: DC

## 2015-03-11 MED ORDER — AZITHROMYCIN 250 MG PO TABS: ORAL_TABLET | ORAL | Status: AC

## 2015-03-11 MED ORDER — AZITHROMYCIN 250 MG PO TABS: ORAL_TABLET | ORAL | Status: DC

## 2015-03-11 NOTE — Progress Notes (Signed)
Patient: Shannon Small Female    DOB: Aug 14, 1960   55 y.o.   MRN: [id number] Visit Date: 03/11/2015  Today's Provider: Lelon Huh, MD   Chief Complaint  Patient presents with  . URI   Subjective:    URI  This is a recurrent problem. The current episode started more than 1 month ago. The problem has been unchanged. Maximum temperature: patient reports she has been runnig a fever intermittently for the past month. Associated symptoms include chest pain, congestion, coughing (productive with green/ brown phlegm), ear pain, headaches, joint pain, nausea, a plugged ear sensation, rhinorrhea, sinus pain, sneezing, a sore throat and wheezing. Pertinent negatives include no abdominal pain, diarrhea, neck pain or vomiting. Treatments tried: Amoxicillin, rx cough syrup. The treatment provided mild relief.  Patient states she also took some of her friends Hycodan cough syrup which improved her cough. Patient feels a heaviness in her chest and thinks the infection has settled ito the chest.  Was seen by Vernie Murders on 02-17-2015 and treated with amoxicillin for sinusitis.      No Known Allergies Previous Medications   ALBUTEROL (PROAIR HFA) 108 (90 BASE) MCG/ACT INHALER    Inhale into the lungs.   FLUTICASONE (FLONASE) 50 MCG/ACT NASAL SPRAY    Place 2 sprays into both nostrils daily.   HYDROCHLOROTHIAZIDE (HYDRODIURIL) 25 MG TABLET    Take 1 tablet (25 mg total) by mouth daily.   IBUPROFEN (ADVIL,MOTRIN) 800 MG TABLET    Take 1 tablet (800 mg total) by mouth 3 (three) times daily.   MAGIC MOUTHWASH SOLN    Gargle with 5 ml after meals and bedtime (3-4 times a day)   METHOCARBAMOL (ROBAXIN) 750 MG TABLET    Take 1 tablet (750 mg total) by mouth 4 (four) times daily.   NYSTATIN (MYCOSTATIN/NYSTOP) 100000 UNIT/GM POWD    Sprinkle powder on vulvar rash morning and bedtime. Keep area clean and dry as possible.   PROMETHAZINE-DEXTROMETHORPHAN (PROMETHAZINE-DM) 6.25-15 MG/5ML SYRUP    Take 5 mLs  by mouth 4 (four) times daily as needed for cough.   RANITIDINE (ZANTAC) 150 MG TABLET    Take by mouth. Reported on 02/17/2015    Review of Systems  Constitutional: Positive for fever, diaphoresis and fatigue. Negative for chills and appetite change.  HENT: Positive for congestion, ear pain, mouth sores, postnasal drip, rhinorrhea, sneezing and sore throat. Negative for nosebleeds.   Respiratory: Positive for cough (productive with green/ brown phlegm), chest tightness, shortness of breath and wheezing.   Cardiovascular: Positive for chest pain. Negative for palpitations.  Gastrointestinal: Positive for nausea. Negative for vomiting, abdominal pain and diarrhea.  Musculoskeletal: Positive for joint pain. Negative for neck pain.  Neurological: Positive for headaches. Negative for dizziness and weakness.    Social History  Substance Use Topics  . Smoking status: Never Smoker   . Smokeless tobacco: Never Used  . Alcohol Use: 0.0 oz/week    0 Standard drinks or equivalent per week     Comment: rarely   Objective:   BP 150/90 mmHg  Pulse 91  Temp(Src) 98 F (36.7 C) (Other (Comment))  Resp 16  SpO2 98%  Physical Exam  . General Appearance:    Alert, cooperative, no distress, obese  HENT:   bilateral TM normal without fluid or infection, neck without nodes and throat normal without erythema or exudate  Eyes:    PERRL, conjunctiva/corneas clear, EOM's intact       Lungs:  Occasional expiratory wheeze, no rales or rhonchi, respirations unlabored  Heart:    Regular rate and rhythm  Neurologic:   Awake, alert, oriented x 3. No apparent focal neurological           defect.           Assessment & Plan:     1. Bronchitis  - azithromycin (ZITHROMAX) 250 MG tablet; 2 by mouth today, then 1 daily for 4 days  Dispense: 6 tablet; Refill: 0  2. Cough  - mometasone-formoterol (DULERA) 100-5 MCG/ACT AERO; Inhale 2 puffs into the lungs 2 (two) times daily.  Dispense: 8.8 g; Refill: 0         Lelon Huh, MD  [facility]

## 2015-03-26 ENCOUNTER — Encounter: Payer: 59 | Admitting: Family Medicine

## 2015-04-15 ENCOUNTER — Encounter: Payer: Self-pay | Admitting: Urology

## 2015-04-15 ENCOUNTER — Ambulatory Visit (INDEPENDENT_AMBULATORY_CARE_PROVIDER_SITE_OTHER): Payer: 59 | Admitting: Urology

## 2015-04-15 VITALS — BP 167/74 | HR 99 | Ht 65.0 in | Wt 350.0 lb

## 2015-04-15 DIAGNOSIS — N3946 Mixed incontinence: Secondary | ICD-10-CM

## 2015-04-15 LAB — URINALYSIS, COMPLETE
BILIRUBIN UA: NEGATIVE
GLUCOSE, UA: NEGATIVE
KETONES UA: NEGATIVE
LEUKOCYTES UA: NEGATIVE
NITRITE UA: NEGATIVE
Protein, UA: NEGATIVE
RBC UA: NEGATIVE
SPEC GRAV UA: 1.025 (ref 1.005–1.030)
Urobilinogen, Ur: 0.2 mg/dL (ref 0.2–1.0)
pH, UA: 5.5 (ref 5.0–7.5)

## 2015-04-15 LAB — BLADDER SCAN AMB NON-IMAGING

## 2015-04-15 LAB — MICROSCOPIC EXAMINATION
BACTERIA UA: NONE SEEN
Epithelial Cells (non renal): >10 /hpf — AB (ref 0–10)

## 2015-04-15 NOTE — Progress Notes (Signed)
04/15/2015 10:36 AM   Shannon Small [birth year] [id number]  Referring provider: Margo Common, [address] [place], [place] [address]  Chief Complaint  Patient presents with  . Urinary Incontinence    New Patient    HPI: The patient is a 55 year old woman who has worsening incontinence over many years she leaks with coughing sneezing sometimes bending and lifting. She denies urge incontinence and enuresis and wears one to 2 pads a day moderately wet. When she 7 she can use multiple pads  Her flow was good but she consented for a few minutes and double void a small amount associated with urgency. She voids every one hour and cannot tip through it to our movie. She gets up 4-5 times a night.  She has had a hysterectomy. She denies a history kidney stones and previous GU surgery and urinary tract infections. She has no neurologic risk factors or symptoms. She has recently normal bowel movements. Her incontinence is not the medically treated.  She has vaginal burning and itchiness with a rash.  Modifying factors: There are no other modifying factors  Associated signs and symptoms: There are no other associated signs and symptoms Aggravating and relieving factors: There are no other aggravating or relieving factors Severity: Moderate Duration: Persistent     PMH: Past Medical History  Diagnosis Date  . Chronic infection of sinus 09/09/2014  . Acid reflux 09/09/2014  . Benign neoplasm of skin 11/14/2006  . Adjustment disorder with depressed mood 11/14/2006    ASSESSMENT: Bereavement. Recommend counseling with pastor or psychologist.   . Blood pressure elevated without history of HTN 09/09/2014  . Cephalalgia 02/10/2006  . Extreme obesity ([place]) 02/10/2006  . LBP (low back pain) 01/15/2007    Chronic back strain and spasms due to obesity.   . Sleep related leg cramps 09/09/2014    Surgical History: Past Surgical History  Procedure Laterality Date  . Abdominal hysterectomy      . Rhinoplasty  1982    Home Medications:    Medication List       This list is accurate as of: 04/15/15 10:36 AM.  Always use your most recent med list.               fluticasone 50 MCG/ACT nasal spray  Commonly known as:  FLONASE  Place 2 sprays into both nostrils daily.     hydrochlorothiazide 25 MG tablet  Commonly known as:  HYDRODIURIL  Take 1 tablet (25 mg total) by mouth daily.     ibuprofen 800 MG tablet  Commonly known as:  ADVIL,MOTRIN  Take 1 tablet (800 mg total) by mouth 3 (three) times daily.     mometasone-formoterol 100-5 MCG/ACT Aero  Commonly known as:  DULERA  Inhale 2 puffs into the lungs 2 (two) times daily.     PROAIR HFA 108 (90 Base) MCG/ACT inhaler  Generic drug:  albuterol  Inhale into the lungs. Reported on 04/15/2015        Allergies: No Known Allergies  Family History: Family History  Problem Relation Age of Onset  . Lung cancer Mother   . Breast cancer Mother   . Diabetes Mother   . Stomach cancer Father   . Alcohol abuse Brother   . Diabetes Maternal Grandmother   . Stroke Maternal Grandmother   . Stroke Maternal Grandfather   . Heart disease Paternal Grandmother   . Stroke Paternal Grandfather   . Bladder Cancer Neg Hx   . Prostate cancer  Neg Hx   . Kidney cancer Father     Social History:  reports that she has never smoked. She has never used smokeless tobacco. She reports that she drinks alcohol. She reports that she does not use illicit drugs.  ROS: UROLOGY Frequent Urination?: Yes Hard to postpone urination?: No Burning/pain with urination?: No Get up at night to urinate?: Yes Leakage of urine?: Yes Urine stream starts and stops?: No Trouble starting stream?: No Do you have to strain to urinate?: No Blood in urine?: No Urinary tract infection?: Yes Sexually transmitted disease?: No Injury to kidneys or bladder?: No Painful intercourse?: No Weak stream?: Yes Currently pregnant?: No Vaginal bleeding?: No Last  menstrual period?: hysterectomy  Gastrointestinal Nausea?: No Vomiting?: No Indigestion/heartburn?: No Diarrhea?: No Constipation?: Yes  Constitutional Fever: No Night sweats?: Yes Weight loss?: No Fatigue?: No  Skin Skin rash/lesions?: Yes Itching?: Yes  Eyes Blurred vision?: No Double vision?: No  Ears/Nose/Throat Sore throat?: No Sinus problems?: Yes  Hematologic/Lymphatic Swollen glands?: No Easy bruising?: No  Cardiovascular Leg swelling?: No Chest pain?: No  Respiratory Cough?: Yes Shortness of breath?: No  Endocrine Excessive thirst?: No  Musculoskeletal Back pain?: Yes Joint pain?: No  Neurological Headaches?: No Dizziness?: No  Psychologic Depression?: No Anxiety?: No  Physical Exam: BP 167/74 mmHg  Pulse 99  Ht 5\' 5"  (1.651 m)  Wt 158.759 kg (350 lb)  BMI 58.24 kg/m2  Constitutional:  Alert and oriented, No acute distress. HEENT: [place] AT, moist mucus membranes.  Trachea midline, no masses. Cardiovascular: No clubbing, cyanosis, or edema. Respiratory: Normal respiratory effort, no increased work of breathing. GI: Abdomen is soft, nontender, nondistended, no abdominal masses GU: No CVA tenderness. On pelvic examination the patient had grade 2 hypermobility of the bladder neck and a mild positive cough test. She had no rectocele or cystocele. 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 She had lost some pigmentation around the clitoris. She may have a small or small sebaceous cysts in that area. Skin: No rashes, bruises or suspicious lesions. Lymph: No cervical or inguinal adenopathy. Neurologic: Grossly intact, no focal deficits, moving all 4 extremities. Psychiatric: Normal mood and affect.  Laboratory Data: Lab Results  Component Value Date   WBC 10.6 07/30/2013   HGB 12.3 07/30/2013   HCT 37.3 07/30/2013   MCV 85 07/30/2013   PLT 214 07/30/2013    Lab Results  Component Value Date   CREATININE 0.67 07/30/2013    No  results found for: PSA  No results found for: TESTOSTERONE  Lab Results  Component Value Date   HGBA1C 5.7 01/14/2009    Urinalysis    Component Value Date/Time   COLORURINE Yellow 07/30/2013 0809   COLORURINE YELLOW 10/17/2012 1815   APPEARANCEUR Clear 07/30/2013 0809   APPEARANCEUR CLEAR 10/17/2012 1815   LABSPEC 1.020 07/30/2013 0809   LABSPEC >1.030* 10/17/2012 1815   PHURINE 6.0 07/30/2013 0809   PHURINE 6.0 10/17/2012 1815   GLUCOSEU Negative 07/30/2013 0809   GLUCOSEU NEGATIVE 10/17/2012 1815   HGBUR Negative 07/30/2013 0809   HGBUR NEGATIVE 10/17/2012 1815   BILIRUBINUR neg 02/17/2015 1009   BILIRUBINUR Negative 07/30/2013 0809   BILIRUBINUR NEGATIVE 10/17/2012 1815   KETONESUR Negative 07/30/2013 0809   KETONESUR NEGATIVE 10/17/2012 1815   PROTEINUR neg 02/17/2015 1009   PROTEINUR Negative 07/30/2013 0809   PROTEINUR NEGATIVE 10/17/2012 1815   UROBILINOGEN 1.0 02/17/2015 1009   UROBILINOGEN 0.2 10/17/2012 1815   NITRITE neg 02/17/2015 1009   NITRITE Negative 07/30/2013 0809  NITRITE NEGATIVE 10/17/2012 1815   LEUKOCYTESUR Negative 02/17/2015 1009   LEUKOCYTESUR Negative 07/30/2013 0809    Pertinent Imaging: Post void residual was 18 mL  Urinalysis negative  Assessment & Plan:  The patient primarily has stress incontinence though has a very overactive bladder with significant nighttime frequency and daytime frequency. She is very obese. I did examine her and with some manipulation a retropubic sling likely could be performed safely. In my opinion she would have difficulty performing clean intermittent catheterization after surgery. Macroplastique is an option but she is younger and a heavy loader. Physical therapy would be a good idea for her. The role of urodynamics discussed. I recommended 3 days of Diflucan  Urodynamics ordered and also recommend a petroleum jelly for dermatitis  1. Mixed stress and urge urinary incontinence 2. Urinary dermatitis 3.  Increased frequency - Urinalysis, Complete - BLADDER SCAN AMB NON-IMAGING   No Follow-up on file.  Reece Packer, MD  [facility] [address], [place], [place] [address] [phone]

## 2015-04-17 IMAGING — CR DG CHEST 1V PORT
1 series · 1 of 1 positions shown · non-contrast
Comparison: None

CLINICAL DATA: Hypertension and chest pain

EXAM:
PORTABLE CHEST - 1 VIEW

[portable]
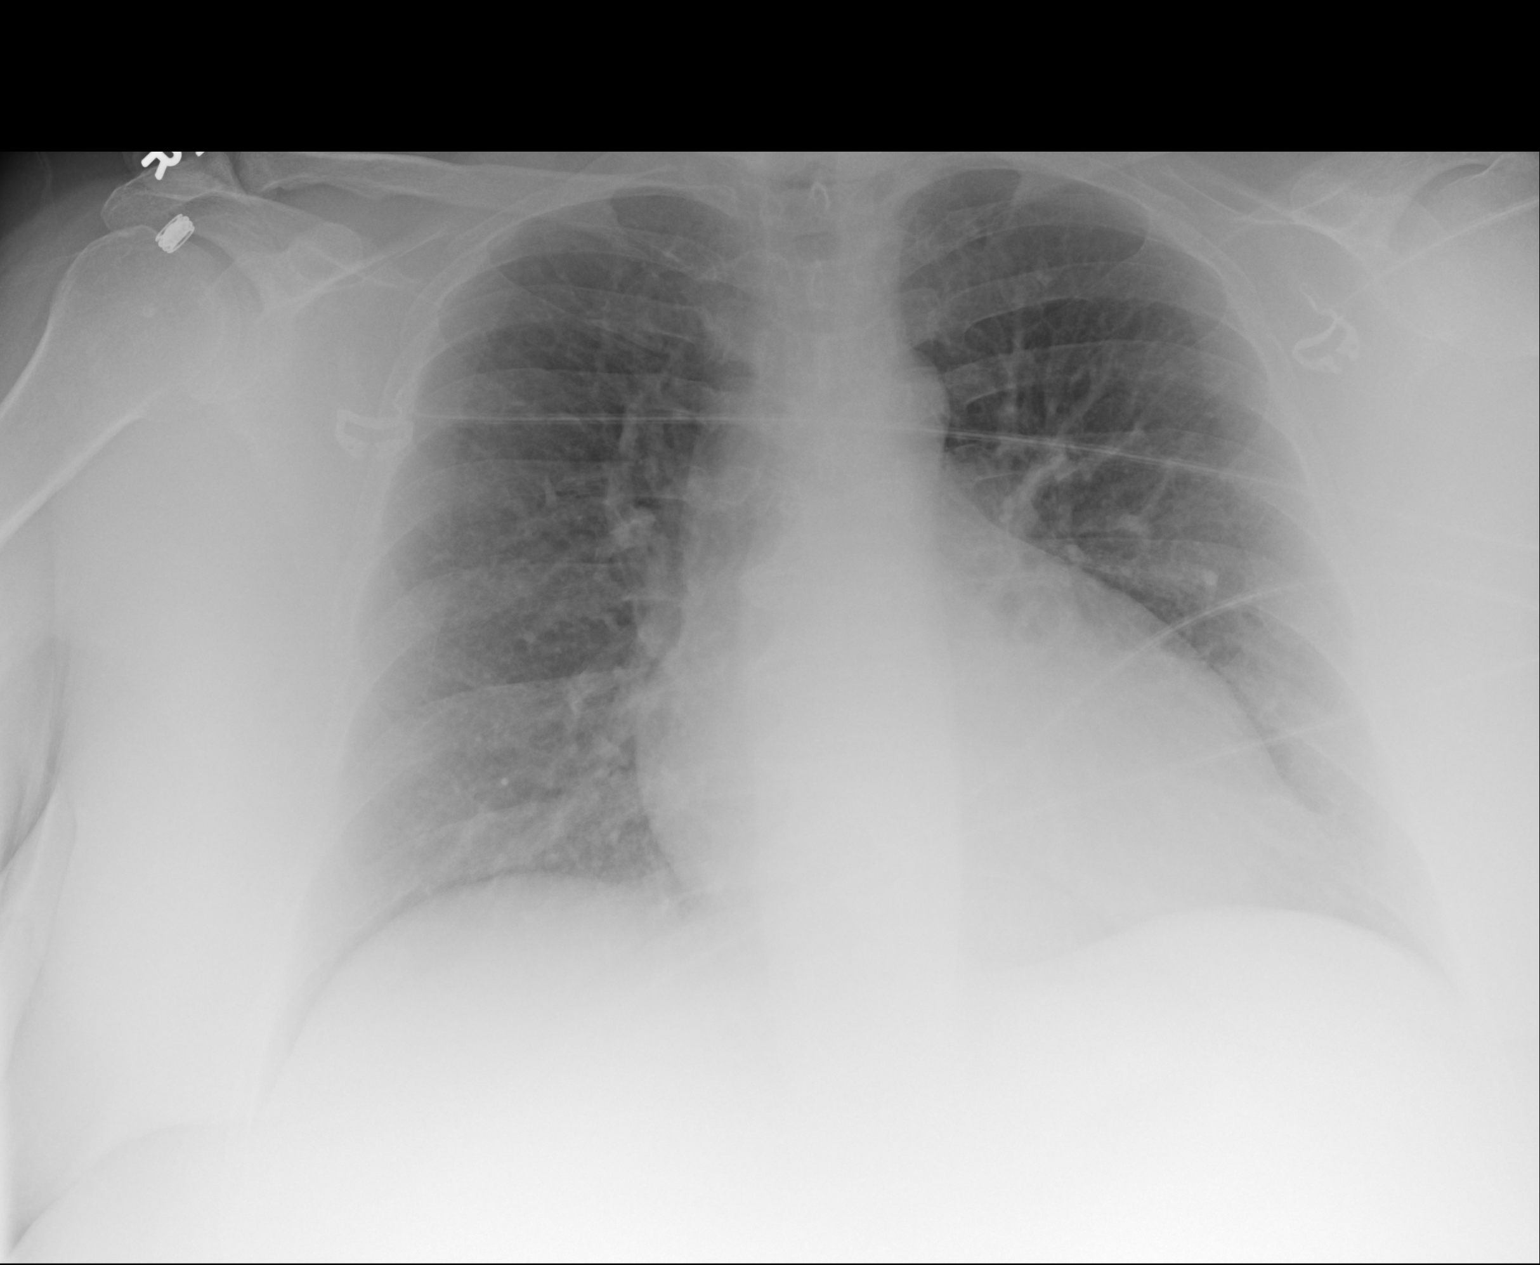

[1 of 1 positions shown; findings below may reference images not displayed]

FINDINGS: Heart size is mildly enlarged. There is no pleural effusion
identified. Pulmonary vascular congestion noted. No airspace
consolidation.
IMPRESSION: 1. Cardiac enlargement and pulmonary vascular congestion.

## 2015-04-21 ENCOUNTER — Other Ambulatory Visit: Payer: Self-pay | Admitting: Family Medicine

## 2015-04-21 MED ORDER — METHOCARBAMOL 750 MG PO TABS: 750.0000 mg | ORAL_TABLET | Freq: Four times a day (QID) | ORAL | Status: DC | PRN

## 2015-04-21 MED ORDER — IBUPROFEN 800 MG PO TABS: 800.0000 mg | ORAL_TABLET | Freq: Three times a day (TID) | ORAL | Status: DC

## 2015-04-21 NOTE — Telephone Encounter (Signed)
Pt contacted office for refill request on the following medications: 1. methocarbamol (ROBAXIN) 750 MG tablet  2. ibuprofen (ADVIL,MOTRIN) 800 MG tablet To [facility]. Thanks TNP

## 2015-04-21 NOTE — Telephone Encounter (Signed)
Advise patient we sill refill Ibuprofen and Robaxin for back pain and spasms at the [facility]. Encouraged to continue to work on weight loss and some stretching exercises for back muscles. Recheck back if worsening.

## 2015-04-21 NOTE — Telephone Encounter (Signed)
Pt informed and voiced understanding of results. 

## 2015-06-03 ENCOUNTER — Other Ambulatory Visit: Payer: Self-pay | Admitting: Urology

## 2015-06-17 ENCOUNTER — Encounter: Payer: Self-pay | Admitting: Urology

## 2015-06-17 ENCOUNTER — Ambulatory Visit (INDEPENDENT_AMBULATORY_CARE_PROVIDER_SITE_OTHER): Payer: 59 | Admitting: Urology

## 2015-06-17 VITALS — BP 168/70 | HR 82 | Ht 65.0 in | Wt 355.5 lb

## 2015-06-17 DIAGNOSIS — N3946 Mixed incontinence: Secondary | ICD-10-CM

## 2015-06-17 MED ORDER — SOLIFENACIN SUCCINATE 5 MG PO TABS: 5.0000 mg | ORAL_TABLET | Freq: Every day | ORAL | Status: DC

## 2015-06-17 NOTE — Progress Notes (Signed)
06/17/2015 10:05 AM   Shannon Small 05-Mar-1960 [id number]  Referring provider: Margo Common, [address] [place], [place] [address]  Chief Complaint  Patient presents with  . Follow-up    UDS results    HPI The patient is a 55 year old woman who has worsening incontinence over many years she leaks with coughing sneezing sometimes bending and lifting. She denies urge incontinence and enuresis and wears one to 2 pads a day moderately wet. When she 7 she can use multiple pads Her flow was good but she consented for a few minutes and double void a small amount associated with urgency. She voids every one hour and cannot tip through it to our movie. She gets up 4-5 times a night.  On pelvic examination the patient had grade 2 hypermobility of the bladder neck and a mild positive cough test. She had no rectocele or cystocele. 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 She had lost some pigmentation around the clitoris. She may have a small or small sebaceous cysts in that area.  The patient primarily has stress incontinence though has a very overactive bladder with significant nighttime frequency and daytime frequency. She is very obese. I did examine her and with some manipulation a retropubic sling likely could be performed safely. In my opinion she would have difficulty performing clean intermittent catheterization after surgery. Macroplastique is an option but she is younger and a heavy loader. Physical therapy would be a good idea for her.   The role of urodynamics discussed. I recommended 3 days of DiflucanOn urodynamics the patient emptied efficiently. Maximum capacity was 325 mL. Bladder was unstable with low pressure contractions and mild leakage. She leaked a moderate amount with Valsalva leak point pressure 75 cm of water. There was artifact noted. During voluntary voiding she voided 325 mL with a maximum flow of 18 mils per second and a residual was 0 mL. She often  strains to void. EMG activity increased during unstable contractions and bladder neck descended 2 cm  PMH: Past Medical History  Diagnosis Date  . Chronic infection of sinus 09/09/2014  . Acid reflux 09/09/2014  . Benign neoplasm of skin 11/14/2006  . Adjustment disorder with depressed mood 11/14/2006    ASSESSMENT: Bereavement. Recommend counseling with pastor or psychologist.   . Blood pressure elevated without history of HTN 09/09/2014  . Cephalalgia 02/10/2006  . Extreme obesity ([place]) 02/10/2006  . LBP (low back pain) 01/15/2007    Chronic back strain and spasms due to obesity.   . Sleep related leg cramps 09/09/2014    Surgical History: Past Surgical History  Procedure Laterality Date  . Abdominal hysterectomy    . Rhinoplasty  1982    Home Medications:    Medication List       This list is accurate as of: 06/17/15 10:05 AM.  Always use your most recent med list.               clotrimazole-betamethasone cream  Commonly known as:  LOTRISONE     fluticasone 50 MCG/ACT nasal spray  Commonly known as:  FLONASE  Place 2 sprays into both nostrils daily.     ibuprofen 800 MG tablet  Commonly known as:  ADVIL,MOTRIN  Take 1 tablet (800 mg total) by mouth 3 (three) times daily.     methocarbamol 750 MG tablet  Commonly known as:  ROBAXIN-750  Take 1 tablet (750 mg total) by mouth every 6 (six) hours as needed for muscle spasms.  mometasone-formoterol 100-5 MCG/ACT Aero  Commonly known as:  DULERA  Inhale 2 puffs into the lungs 2 (two) times daily.     PROAIR HFA 108 (90 Base) MCG/ACT inhaler  Generic drug:  albuterol  Inhale into the lungs. Reported on 04/15/2015        Allergies: No Known Allergies  Family History: Family History  Problem Relation Age of Onset  . Lung cancer Mother   . Breast cancer Mother   . Diabetes Mother   . Stomach cancer Father   . Alcohol abuse Brother   . Diabetes Maternal Grandmother   . Stroke Maternal Grandmother   . Stroke  Maternal Grandfather   . Heart disease Paternal Grandmother   . Stroke Paternal Grandfather   . Bladder Cancer Neg Hx   . Prostate cancer Neg Hx   . Kidney cancer Father     Social History:  reports that she has never smoked. She has never used smokeless tobacco. She reports that she drinks alcohol. She reports that she does not use illicit drugs.  ROS: UROLOGY Frequent Urination?: Yes Hard to postpone urination?: No Burning/pain with urination?: No Get up at night to urinate?: Yes Leakage of urine?: Yes Urine stream starts and stops?: No Trouble starting stream?: No Do you have to strain to urinate?: No Blood in urine?: No Urinary tract infection?: No Sexually transmitted disease?: No Injury to kidneys or bladder?: No Painful intercourse?: Yes Weak stream?: Yes Currently pregnant?: No Vaginal bleeding?: No Last menstrual period?: n  Gastrointestinal Nausea?: No Vomiting?: No Indigestion/heartburn?: Yes Diarrhea?: No Constipation?: No  Constitutional Fever: No Night sweats?: Yes Weight loss?: No Fatigue?: No  Skin Skin rash/lesions?: No Itching?: No  Eyes Blurred vision?: No Double vision?: No  Ears/Nose/Throat Sore throat?: No Sinus problems?: No  Hematologic/Lymphatic Swollen glands?: No Easy bruising?: No  Cardiovascular Leg swelling?: No Chest pain?: No  Respiratory Cough?: Yes Shortness of breath?: No  Endocrine Excessive thirst?: No  Musculoskeletal Back pain?: No Joint pain?: No  Neurological Headaches?: Yes Dizziness?: No  Psychologic Depression?: No Anxiety?: No  Physical Exam: BP 168/70 mmHg  Pulse 82  Ht 5\' 5"  (1.651 m)  Wt 355 lb 8 oz (161.254 kg)  BMI 59.16 kg/m2    Laboratory Data: Lab Results  Component Value Date   WBC 10.6 07/30/2013   HGB 12.3 07/30/2013   HCT 37.3 07/30/2013   MCV 85 07/30/2013   PLT 214 07/30/2013     Lab Results  Component Value Date   HGBA1C 5.7 01/14/2009    Urinalysis      Component Value Date/Time   COLORURINE Yellow 07/30/2013 0809   COLORURINE YELLOW 10/17/2012 1815   APPEARANCEUR Hazy* 04/15/2015 1011   APPEARANCEUR Clear 07/30/2013 0809   APPEARANCEUR CLEAR 10/17/2012 1815   LABSPEC 1.020 07/30/2013 0809   LABSPEC >1.030* 10/17/2012 1815   PHURINE 6.0 07/30/2013 0809   PHURINE 6.0 10/17/2012 1815   GLUCOSEU Negative 04/15/2015 1011   GLUCOSEU Negative 07/30/2013 0809   HGBUR Negative 07/30/2013 0809   HGBUR NEGATIVE 10/17/2012 1815   BILIRUBINUR Negative 04/15/2015 1011   BILIRUBINUR neg 02/17/2015 1009   BILIRUBINUR Negative 07/30/2013 0809   BILIRUBINUR NEGATIVE 10/17/2012 1815   KETONESUR Negative 07/30/2013 0809   KETONESUR NEGATIVE 10/17/2012 1815   PROTEINUR Negative 04/15/2015 1011   PROTEINUR neg 02/17/2015 1009   PROTEINUR Negative 07/30/2013 0809   PROTEINUR NEGATIVE 10/17/2012 1815   UROBILINOGEN 1.0 02/17/2015 1009   UROBILINOGEN 0.2 10/17/2012 1815   NITRITE Negative 04/15/2015  1011   NITRITE neg 02/17/2015 1009   NITRITE Negative 07/30/2013 0809   NITRITE NEGATIVE 10/17/2012 1815   LEUKOCYTESUR Negative 04/15/2015 1011   LEUKOCYTESUR Negative 02/17/2015 1009   LEUKOCYTESUR Negative 07/30/2013 0809    Pertinent Imaging: None  Assessment & Plan:  The patient has a mild to moderate outlet abnormality but obesity is a complicating factor. She does have frequency and nocturia that is significant and is an overactive bladder on urodynamics. The role of medical and behavioral therapy and physical therapy discussed. The urethral injectable versus sling her options in the future to consider if we address her outlet  1. Stress incontinence 2. Frequency. 3. Nighttime frequency  Reece Packer, MD  [facility] [address] [place], [place] [address] [phone]

## 2015-07-27 ENCOUNTER — Ambulatory Visit: Payer: 59

## 2015-08-22 ENCOUNTER — Emergency Department: Payer: Self-pay

## 2015-08-22 ENCOUNTER — Emergency Department
Admission: EM | Admit: 2015-08-22 | Discharge: 2015-08-22 | Disposition: A | Discharge status: Home / Self Care | Payer: Self-pay | Attending: Emergency Medicine | Admitting: Emergency Medicine

## 2015-08-22 ENCOUNTER — Encounter: Payer: Self-pay | Admitting: Emergency Medicine

## 2015-08-22 DIAGNOSIS — M549 Dorsalgia, unspecified: Secondary | ICD-10-CM | POA: Insufficient documentation

## 2015-08-22 DIAGNOSIS — Z7951 Long term (current) use of inhaled steroids: Secondary | ICD-10-CM | POA: Insufficient documentation

## 2015-08-22 DIAGNOSIS — Z79899 Other long term (current) drug therapy: Secondary | ICD-10-CM | POA: Insufficient documentation

## 2015-08-22 DIAGNOSIS — R0789 Other chest pain: Secondary | ICD-10-CM

## 2015-08-22 DIAGNOSIS — R079 Chest pain, unspecified: Secondary | ICD-10-CM

## 2015-08-22 DIAGNOSIS — Z791 Long term (current) use of non-steroidal anti-inflammatories (NSAID): Secondary | ICD-10-CM | POA: Insufficient documentation

## 2015-08-22 LAB — CBC
HCT: 37.3 % (ref 35.0–47.0)
Hemoglobin: 12.4 g/dL (ref 12.0–16.0)
MCH: 28.2 pg (ref 26.0–34.0)
MCHC: 33.3 g/dL (ref 32.0–36.0)
MCV: 84.7 fL (ref 80.0–100.0)
PLATELETS: 207 10*3/uL (ref 150–440)
RBC: 4.4 MIL/uL (ref 3.80–5.20)
RDW: 14 % (ref 11.5–14.5)
WBC: 11.2 10*3/uL — AB (ref 3.6–11.0)

## 2015-08-22 LAB — COMPREHENSIVE METABOLIC PANEL
ALBUMIN: 3.6 g/dL (ref 3.5–5.0)
ALT: 23 U/L (ref 14–54)
AST: 23 U/L (ref 15–41)
Alkaline Phosphatase: 80 U/L (ref 38–126)
Anion gap: 6 (ref 5–15)
BUN: 20 mg/dL (ref 6–20)
CHLORIDE: 107 mmol/L (ref 101–111)
CO2: 24 mmol/L (ref 22–32)
CREATININE: 0.63 mg/dL (ref 0.44–1.00)
Calcium: 8.6 mg/dL — ABNORMAL LOW (ref 8.9–10.3)
GFR calc Af Amer: >60 mL/min (ref >60)
GFR calc non Af Amer: >60 mL/min (ref >60)
Glucose, Bld: 95 mg/dL (ref 65–99)
POTASSIUM: 3.6 mmol/L (ref 3.5–5.1)
SODIUM: 137 mmol/L (ref 135–145)
Total Bilirubin: 0.6 mg/dL (ref 0.3–1.2)
Total Protein: 7.2 g/dL (ref 6.5–8.1)

## 2015-08-22 LAB — LIPASE, BLOOD: LIPASE: 21 U/L (ref 11–51)

## 2015-08-22 LAB — TROPONIN I: Troponin I: <0.03 ng/mL (ref <0.03)

## 2015-08-22 MED ORDER — CYCLOBENZAPRINE HCL 5 MG PO TABS: 5.0000 mg | ORAL_TABLET | Freq: Three times a day (TID) | ORAL | 0 refills | Status: DC | PRN

## 2015-08-22 MED ORDER — TRAMADOL HCL 50 MG PO TABS: 50.0000 mg | ORAL_TABLET | Freq: Four times a day (QID) | ORAL | 0 refills | Status: DC | PRN

## 2015-08-22 MED ORDER — TRAMADOL HCL 50 MG PO TABS
100.0000 mg | ORAL_TABLET | Freq: Once | ORAL | Status: AC
Start: 2015-08-22 — End: 2015-08-22
  Administered 2015-08-22: 100 mg via ORAL
  Filled 2015-08-22: qty 2

## 2015-08-22 NOTE — Discharge Instructions (Signed)
You have been seen in the emergency department today for chest pain. Your workup has shown normal results. As we discussed please follow-up with your primary care physician in the next 1-2 days for recheck. Return to the emergency department for any further chest pain, trouble breathing, or any other symptom personally concerning to yourself. °

## 2015-08-22 NOTE — ED Triage Notes (Signed)
Pt c/o central chest pain and in upper back. Also c/o pain under breasts. Has had SHOB. Pain past couple days but worse today. Denies vomiting. Has had nausea.

## 2015-08-22 NOTE — ED Provider Notes (Signed)
[facility] Emergency Department Provider Note  Time seen: 7:46 AM  I have reviewed the triage vital signs and the nursing notes.   HISTORY  Chief Complaint Chest Pain    HPI Shannon Small is a 55 y.o. female with a past medical history of back pain, gastric reflux, hypertension, who presents the emergency department for back pain. According to the patient for the past 2 or 3 days she has had increased upper back pain. She states a history of lower back pain and will occasionally get upper back pain but that has been more constant for the past 3 days. Patient states occasionally she will feel pains in the front of her chest over the past 3 days. Denies any pain in the front of her chest currently but states 7/10 upper back pain. Denies abdominal pain. Denies nausea, vomiting, diaphoresis or shortness of breath. She does state increased cough over the past 2 days. Denies leg pain. Describes the back pain as dull aching pain 7/10 in severity.  Past Medical History:  Diagnosis Date  . Acid reflux 09/09/2014  . Adjustment disorder with depressed mood 11/14/2006   ASSESSMENT: Bereavement. Recommend counseling with pastor or psychologist.   . Benign neoplasm of skin 11/14/2006  . Blood pressure elevated without history of HTN 09/09/2014  . Cephalalgia 02/10/2006  . Chronic infection of sinus 09/09/2014  . Extreme obesity ([place]) 02/10/2006  . LBP (low back pain) 01/15/2007   Chronic back strain and spasms due to obesity.   . Sleep related leg cramps 09/09/2014    Patient Active Problem List   Diagnosis Date Noted  . Chronic infection of sinus 09/09/2014  . Blood pressure elevated without history of HTN 09/09/2014  . Acid reflux 09/09/2014  . Sleep related leg cramps 09/09/2014  . LBP (low back pain) 01/15/2007  . Adjustment disorder with depressed mood 11/14/2006  . Benign neoplasm of skin 11/14/2006  . Cephalalgia 02/10/2006  . Extreme obesity ([place]) 02/10/2006    Past  Surgical History:  Procedure Laterality Date  . ABDOMINAL HYSTERECTOMY    . RHINOPLASTY  1982    Prior to Admission medications   Medication Sig Start Date End Date Taking? Authorizing Provider  albuterol (PROAIR HFA) 108 (90 BASE) MCG/ACT inhaler Inhale into the lungs. Reported on 04/15/2015 08/03/10   Historical Provider, MD  clotrimazole-betamethasone (LOTRISONE) cream  04/15/15   Historical Provider, MD  fluticasone (FLONASE) 50 MCG/ACT nasal spray Place 2 sprays into both nostrils daily. 02/17/15   Vickki Muff Chrismon, PA  ibuprofen (ADVIL,MOTRIN) 800 MG tablet Take 1 tablet (800 mg total) by mouth 3 (three) times daily. 04/21/15   Vickki Muff Chrismon, PA  methocarbamol (ROBAXIN-750) 750 MG tablet Take 1 tablet (750 mg total) by mouth every 6 (six) hours as needed for muscle spasms. 04/21/15   Vickki Muff Chrismon, PA  mometasone-formoterol (DULERA) 100-5 MCG/ACT AERO Inhale 2 puffs into the lungs 2 (two) times daily. 03/11/15 03/25/15  Birdie Sons, MD  solifenacin (VESICARE) 5 MG tablet Take 1 tablet (5 mg total) by mouth daily. 06/17/15   Bjorn Loser, MD    No Known Allergies  Family History  Problem Relation Age of Onset  . Stomach cancer Father   . Kidney cancer Father   . Diabetes Maternal Grandmother   . Stroke Maternal Grandmother   . Stroke Maternal Grandfather   . Heart disease Paternal Grandmother   . Stroke Paternal Grandfather   . Lung cancer Mother   . Breast  cancer Mother   . Diabetes Mother   . Alcohol abuse Brother   . Bladder Cancer Neg Hx   . Prostate cancer Neg Hx     Social History Social History  Substance Use Topics  . Smoking status: Never Smoker  . Smokeless tobacco: Never Used  . Alcohol use 0.0 oz/week     Comment: rarely    Review of Systems Constitutional: Negative for fever. Cardiovascular: Intermittent frontal chest pain Respiratory: Negative for shortness of breath. Gastrointestinal: Negative for abdominal pain Musculoskeletal: Upper back  pain Skin: Negative for rash. Neurological: Negative for headaches, focal weakness or numbness. 10-point ROS otherwise negative.  ____________________________________________   PHYSICAL EXAM:  VITAL SIGNS: ED Triage Vitals [08/22/15 0739]  Enc Vitals Group     BP      Pulse      Resp      Temp      Temp src      SpO2      Weight (!) 355 lb (161 kg)     Height 5\' 5"  (1.651 m)     Head Circumference      Peak Flow      Pain Score 8     Pain Loc      Pain Edu?      Excl. in [place]?     Constitutional: Alert and oriented. Well appearing and in no distress. Eyes: Normal exam ENT   Head: Normocephalic and atraumatic.   Mouth/Throat: Mucous membranes are moist. Cardiovascular: Normal rate, regular rhythm. No murmur Respiratory: Normal respiratory effort without tachypnea nor retractions. Breath sounds are clear. Gastrointestinal: Soft and nontender. No distention.   Musculoskeletal: Nontender with normal range of motion in all extremities. Mild lower extremity edema bilaterally, no tenderness to Palpation. Moderate paraspinal T-spine tenderness to palpation. Neurologic:  Normal speech and language. No gross focal neurologic deficits  Skin:  Skin is warm, dry and intact.  Psychiatric: Mood and affect are normal. Speech and behavior are normal.   ____________________________________________    EKG  EKG reviewed and interpreted by myself shows normal sinus rhythm at 87 bpm, narrow QRS, normal axis, normal intervals, no concerning ST changes. ____________________________________________    RADIOLOGY  Chest x-ray negative  ____________________________________________   INITIAL IMPRESSION / ASSESSMENT AND PLAN / ED COURSE  Pertinent labs & imaging results that were available during my care of the patient were reviewed by me and considered in my medical decision making (see chart for details).  The patient presents the emergency department with 2-3 days of upper  back discomfort which she describes as dull aching, worse with movement or cough. Patient also states occasional pain in the front of her chest. Denies trouble breathing nausea or diaphoresis. Denies leg pain. Patient is requesting no IV be placed. She thinks it is just her normal back pain which has gotten worse, but wanted to make sure she was not having a heart attack per patient. Overall the patient appears well. We will check labs including cardiac enzymes, and obtain a chest x-ray. EKG is reassuring.  Labs within normal limits including negative troponin, normal lipase. EKG is reassuring. Chest x-ray is normal. Patient states her pain is better following Ultram. Suspect likely musculoskeletal pain given the reproducibility on paraspinal T-spine palpation. Overall the patient appears well. We'll discharge home with PCP follow-up. Patient agreeable plan.  ____________________________________________   FINAL CLINICAL IMPRESSION(S) / ED DIAGNOSES  Back pain Chest pain    Harvest Dark, MD 08/22/15 0830

## 2015-09-17 ENCOUNTER — Telehealth: Payer: Self-pay | Admitting: Family Medicine

## 2015-09-17 NOTE — Telephone Encounter (Signed)
Pt contacted office for refill request on the following medications: 1. ibuprofen (ADVIL,MOTRIN) 800 MG tablet   2. methocarbamol (ROBAXIN-750) 750 MG tablet Last written: 04/21/15 both with 1 refill Last OV: 03/11/15 [facility] [place]. Please advise. Thanks TNP

## 2015-09-17 NOTE — Telephone Encounter (Signed)
Please review-aa 

## 2015-09-18 ENCOUNTER — Other Ambulatory Visit: Payer: Self-pay | Admitting: Family Medicine

## 2015-09-18 DIAGNOSIS — M545 Low back pain: Secondary | ICD-10-CM

## 2015-09-18 MED ORDER — METHOCARBAMOL 750 MG PO TABS
750.0000 mg | ORAL_TABLET | Freq: Four times a day (QID) | ORAL | 1 refills | Status: DC | PRN
Start: 2015-09-18 — End: 2016-03-01

## 2015-09-18 MED ORDER — IBUPROFEN 800 MG PO TABS: 800.0000 mg | ORAL_TABLET | Freq: Three times a day (TID) | ORAL | 1 refills | Status: DC

## 2015-09-25 ENCOUNTER — Ambulatory Visit: Payer: 59

## 2015-10-05 ENCOUNTER — Ambulatory Visit: Payer: 59

## 2015-12-11 ENCOUNTER — Other Ambulatory Visit: Payer: Self-pay | Admitting: Family Medicine

## 2015-12-11 MED ORDER — IBUPROFEN 800 MG PO TABS: 800.0000 mg | ORAL_TABLET | Freq: Three times a day (TID) | ORAL | 1 refills | Status: DC

## 2015-12-11 NOTE — Telephone Encounter (Signed)
Pt contacted office for refill request on the following medications:  ibuprofen (ADVIL,MOTRIN) 800 MG tablet.  [place]  [id number]

## 2015-12-14 ENCOUNTER — Other Ambulatory Visit: Payer: Self-pay | Admitting: Family Medicine

## 2015-12-14 DIAGNOSIS — M545 Low back pain, unspecified: Secondary | ICD-10-CM

## 2015-12-14 MED ORDER — ORPHENADRINE CITRATE ER 100 MG PO TB12
100.0000 mg | ORAL_TABLET | Freq: Two times a day (BID) | ORAL | 1 refills | Status: DC | PRN
Start: 2015-12-14 — End: 2016-08-01

## 2015-12-14 MED ORDER — IBUPROFEN 800 MG PO TABS: 800.0000 mg | ORAL_TABLET | Freq: Three times a day (TID) | ORAL | 1 refills | Status: DC

## 2015-12-14 NOTE — Telephone Encounter (Signed)
Changed prescriptions to the [facility]

## 2015-12-14 NOTE — Telephone Encounter (Signed)
Pt stated that she called Friday and requested refills on  1. orphenadrine (NORFLEX) 100 MG tablet  Last Rx: 09/09/14 with 1 Refill. 2. ibuprofen (ADVIL,MOTRIN) 800 MG tablet Last Rx: 12/11/15 but was sent to the wrong pharmacy. Last OV: 03/11/15  To be sent to [place]. Pt request this be done today. Please advise. Thanks TNP

## 2015-12-15 NOTE — Telephone Encounter (Signed)
Patient advised.

## 2016-01-15 ENCOUNTER — Emergency Department: Payer: 59

## 2016-01-15 ENCOUNTER — Encounter: Payer: Self-pay | Admitting: Emergency Medicine

## 2016-01-15 DIAGNOSIS — J989 Respiratory disorder, unspecified: Secondary | ICD-10-CM | POA: Diagnosis not present

## 2016-01-15 DIAGNOSIS — Z79899 Other long term (current) drug therapy: Secondary | ICD-10-CM | POA: Insufficient documentation

## 2016-01-15 DIAGNOSIS — Z791 Long term (current) use of non-steroidal anti-inflammatories (NSAID): Secondary | ICD-10-CM | POA: Diagnosis not present

## 2016-01-15 DIAGNOSIS — R0602 Shortness of breath: Secondary | ICD-10-CM | POA: Diagnosis present

## 2016-01-15 LAB — BASIC METABOLIC PANEL
ANION GAP: 7 (ref 5–15)
BUN: 18 mg/dL (ref 6–20)
CO2: 25 mmol/L (ref 22–32)
Calcium: 9.1 mg/dL (ref 8.9–10.3)
Chloride: 105 mmol/L (ref 101–111)
Creatinine, Ser: 0.77 mg/dL (ref 0.44–1.00)
GFR calc Af Amer: >60 mL/min (ref >60)
Glucose, Bld: 100 mg/dL — ABNORMAL HIGH (ref 65–99)
POTASSIUM: 3.8 mmol/L (ref 3.5–5.1)
SODIUM: 137 mmol/L (ref 135–145)

## 2016-01-15 LAB — CBC
HEMATOCRIT: 40.1 % (ref 35.0–47.0)
HEMOGLOBIN: 13.6 g/dL (ref 12.0–16.0)
MCH: 28.7 pg (ref 26.0–34.0)
MCHC: 33.8 g/dL (ref 32.0–36.0)
MCV: 85.1 fL (ref 80.0–100.0)
Platelets: 200 10*3/uL (ref 150–440)
RBC: 4.72 MIL/uL (ref 3.80–5.20)
RDW: 14.2 % (ref 11.5–14.5)
WBC: 6.6 10*3/uL (ref 3.6–11.0)

## 2016-01-15 LAB — TROPONIN I: Troponin I: <0.03 ng/mL (ref <0.03)

## 2016-01-15 NOTE — ED Triage Notes (Signed)
Pt ambulatory to triage in NAD, reports chest pain, SOB, productive cough.  Pt denies hx heart or lung problems.  Pt NAD at this time.

## 2016-01-16 ENCOUNTER — Emergency Department
Admission: EM | Admit: 2016-01-16 | Discharge: 2016-01-16 | Disposition: A | Discharge status: Home / Self Care | Payer: 59 | Attending: Emergency Medicine | Admitting: Emergency Medicine

## 2016-01-16 DIAGNOSIS — R69 Illness, unspecified: Secondary | ICD-10-CM

## 2016-01-16 DIAGNOSIS — B9789 Other viral agents as the cause of diseases classified elsewhere: Secondary | ICD-10-CM

## 2016-01-16 DIAGNOSIS — J988 Other specified respiratory disorders: Secondary | ICD-10-CM

## 2016-01-16 DIAGNOSIS — J111 Influenza due to unidentified influenza virus with other respiratory manifestations: Secondary | ICD-10-CM

## 2016-01-16 MED ORDER — HYDROCODONE-HOMATROPINE 5-1.5 MG/5ML PO SYRP: 5.0000 mL | ORAL_SOLUTION | Freq: Four times a day (QID) | ORAL | 0 refills | Status: DC | PRN

## 2016-01-16 NOTE — ED Provider Notes (Signed)
[facility] Emergency Department Provider Note  ____________________________________________   First MD Initiated Contact with Patient 01/16/16 [phone]     (approximate)  I have reviewed the triage vital signs and the nursing notes.   HISTORY  Chief Complaint Chest Pain and Shortness of Breath    HPI Shannon Small is a 56 y.o. female with medical history as listed below and presents for evaluation of shortness of breath, productive cough, and chest pain when she coughs.  These problems have been present for weeks but they have gotten worse over the last few days.  She has been treated with 2 rounds of antibiotics as an outpatient including both azithromycin and amoxicillin or Augmentin.  She is also seen an ENT specialist recently for pain in her ears.  However over the last few days is when her symptoms became acutely worse.  She states that she has generalized body aches/myalgias, nasal congestion/runny nose, productive cough.  She has felt cold at times but denies any known fevers.  She denies abdominal pain, nausea, vomiting, dysuria.  Nothing makes her symptoms better and nothing makes them worse.   Past Medical History:  Diagnosis Date  . Acid reflux 09/09/2014  . Adjustment disorder with depressed mood 11/14/2006   ASSESSMENT: Bereavement. Recommend counseling with pastor or psychologist.   . Benign neoplasm of skin 11/14/2006  . Blood pressure elevated without history of HTN 09/09/2014  . Cephalalgia 02/10/2006  . Chronic infection of sinus 09/09/2014  . Extreme obesity ([place]) 02/10/2006  . LBP (low back pain) 01/15/2007   Chronic back strain and spasms due to obesity.   . Sleep related leg cramps 09/09/2014    Patient Active Problem List   Diagnosis Date Noted  . Chronic infection of sinus 09/09/2014  . Blood pressure elevated without history of HTN 09/09/2014  . Acid reflux 09/09/2014  . Sleep related leg cramps 09/09/2014  . Low back pain 01/15/2007  .  Adjustment disorder with depressed mood 11/14/2006  . Benign neoplasm of skin 11/14/2006  . Cephalalgia 02/10/2006  . Extreme obesity ([place]) 02/10/2006    Past Surgical History:  Procedure Laterality Date  . ABDOMINAL HYSTERECTOMY    . RHINOPLASTY  1982    Prior to Admission medications   Medication Sig Start Date End Date Taking? Authorizing Provider  albuterol (PROAIR HFA) 108 (90 BASE) MCG/ACT inhaler Inhale into the lungs. Reported on 04/15/2015 08/03/10   Historical Provider, MD  clotrimazole-betamethasone Donalynn Furlong) cream  04/15/15   Historical Provider, MD  cyclobenzaprine (FLEXERIL) 5 MG tablet Take 1 tablet (5 mg total) by mouth 3 (three) times daily as needed for muscle spasms. 08/22/15   Harvest Dark, MD  fluticasone (FLONASE) 50 MCG/ACT nasal spray Place 2 sprays into both nostrils daily. 02/17/15   Vickki Muff Chrismon, PA  HYDROcodone-homatropine (HYCODAN) 5-1.5 MG/5ML syrup Take 5 mLs by mouth every 6 (six) hours as needed for cough. 01/16/16   Hinda Kehr, MD  ibuprofen (ADVIL,MOTRIN) 800 MG tablet Take 1 tablet (800 mg total) by mouth 3 (three) times daily. 12/14/15   Vickki Muff Chrismon, PA  methocarbamol (ROBAXIN-750) 750 MG tablet Take 1 tablet (750 mg total) by mouth every 6 (six) hours as needed for muscle spasms. 09/18/15   Vickki Muff Chrismon, PA  mometasone-formoterol (DULERA) 100-5 MCG/ACT AERO Inhale 2 puffs into the lungs 2 (two) times daily. 03/11/15 03/25/15  Birdie Sons, MD  orphenadrine (NORFLEX) 100 MG tablet Take 1 tablet (100 mg total) by mouth 2 (two)  times daily as needed for muscle spasms. 12/14/15   Vickki Muff Chrismon, PA  solifenacin (VESICARE) 5 MG tablet Take 1 tablet (5 mg total) by mouth daily. 06/17/15   Bjorn Loser, MD  traMADol (ULTRAM) 50 MG tablet Take 1 tablet (50 mg total) by mouth every 6 (six) hours as needed. 08/22/15 08/21/16  Harvest Dark, MD    Allergies Patient has no known allergies.  Family History  Problem Relation Age of Onset  .  Stomach cancer Father   . Kidney cancer Father   . Diabetes Maternal Grandmother   . Stroke Maternal Grandmother   . Stroke Maternal Grandfather   . Heart disease Paternal Grandmother   . Stroke Paternal Grandfather   . Lung cancer Mother   . Breast cancer Mother   . Diabetes Mother   . Alcohol abuse Brother   . Bladder Cancer Neg Hx   . Prostate cancer Neg Hx     Social History Social History  Substance Use Topics  . Smoking status: Never Smoker  . Smokeless tobacco: Never Used  . Alcohol use 0.0 oz/week     Comment: rarely    Review of Systems Constitutional: Subjective fever/chills.  Myalgias, bodyaches. Eyes: No visual changes. ENT: No sore throat. Cardiovascular: Chest pain when coughing. Respiratory: +shortness of breath, +productive cough Gastrointestinal: No abdominal pain.  No nausea, no vomiting.  No diarrhea.  No constipation. Genitourinary: Negative for dysuria. Musculoskeletal: Negative for back pain. Skin: Negative for rash. Neurological: Negative for headaches, focal weakness or numbness.  10-point ROS otherwise negative.  ____________________________________________   PHYSICAL EXAM:  VITAL SIGNS: ED Triage Vitals  Enc Vitals Group     BP 01/15/16 2248 (!) 182/92     Pulse Rate 01/15/16 2248 96     Resp 01/15/16 2248 (!) 24     Temp 01/15/16 2248 98.1 F (36.7 C)     Temp Source 01/15/16 2248 Oral     SpO2 01/15/16 2248 98 %     Weight 01/15/16 2249 (!) 355 lb (161 kg)     Height 01/15/16 2249 5\' 5"  (1.651 m)     Head Circumference --      Peak Flow --      Pain Score 01/15/16 2249 9     Pain Loc --      Pain Edu? --      Excl. in [place]? --     Constitutional: Alert and oriented. Well appearing and in no acute distress. Eyes: Conjunctivae are normal. PERRL. EOMI. Head: Atraumatic. Nose: +congestion/rhinnorhea. Mouth/Throat: Mucous membranes are moist.  Oropharynx non-erythematous. Neck: No stridor.  No meningeal signs.   Cardiovascular:  Normal rate, regular rhythm. Good peripheral circulation. Grossly normal heart sounds. Respiratory: Normal respiratory effort.  No retractions. Lungs CTAB. Gastrointestinal: Morbid obesity.  Soft and nontender. No distention.  Musculoskeletal: No lower extremity tenderness nor edema. No gross deformities of extremities. Neurologic:  Normal speech and language. No gross focal neurologic deficits are appreciated.  Skin:  Skin is warm, dry and intact. No rash noted. Psychiatric: Mood and affect are normal. Speech and behavior are normal.  ____________________________________________   LABS (all labs ordered are listed, but only abnormal results are displayed)  Labs Reviewed  BASIC METABOLIC PANEL - Abnormal; Notable for the following:       Result Value   Glucose, Bld 100 (*)    All other components within normal limits  CBC  TROPONIN I   ____________________________________________  EKG  ED ECG REPORT I,  Daimion Adamcik, the attending physician, personally viewed and interpreted this ECG.  Date: 01/15/2016 EKG Time: 22:45 Rate: 100 Rhythm: Borderline sinus tachycardia QRS Axis: normal Intervals: normal, borderline LVH ST/T Wave abnormalities: Non-specific ST segment / T-wave changes, but no evidence of acute ischemia. Conduction Disturbances: none Narrative Interpretation: unremarkable  ____________________________________________  RADIOLOGY   Dg Chest 2 View  Result Date: 01/15/2016 CLINICAL DATA:  Chest pain, dyspnea and productive cough for 1 month, worse over the past week. EXAM: CHEST  2 VIEW COMPARISON:  08/22/2015 FINDINGS: The heart size and mediastinal contours are within normal limits. Both lungs are clear. The visualized skeletal structures are unremarkable. IMPRESSION: No active cardiopulmonary disease. Electronically Signed   By: Andreas Newport M.D.   On: 01/15/2016 23:23    ____________________________________________   PROCEDURES  Procedure(s)  performed:   Procedures   Critical Care performed: No ____________________________________________   INITIAL IMPRESSION / ASSESSMENT AND PLAN / ED COURSE  Pertinent labs & imaging results that were available during my care of the patient were reviewed by me and considered in my medical decision making (see chart for details).  The patient has a reassuring workup including normal labs and chest x-ray.  Her vital signs are essentially normal except for some borderline tachycardia.  She has obvious viral symptoms and her symptoms are most consistent with influenza or an influenza-like illness.  She only has chest pain occasionally when she coughs.  There is no indication of PE (Wells Score for PE is 1.5 for tachycardia, and that is only intermittent).  I had my usual and customary viral illness discussion and recommended close outpatient follow-up, symptomatic treatment, and I will provide a prescription for cough medicine.  She is already gone through 2 rounds of outpatient antibiotics and I explained that I do not think antibiotics would be helpful for her.  She understands and agrees with the plan.  I gave my usual and customary return precautions.      ____________________________________________  FINAL CLINICAL IMPRESSION(S) / ED DIAGNOSES  Final diagnoses:  Influenza-like illness  Viral respiratory illness     MEDICATIONS GIVEN DURING THIS VISIT:  Medications - No data to display   NEW OUTPATIENT MEDICATIONS STARTED DURING THIS VISIT:  New Prescriptions   HYDROCODONE-HOMATROPINE (HYCODAN) 5-1.5 MG/5ML SYRUP    Take 5 mLs by mouth every 6 (six) hours as needed for cough.    Modified Medications   No medications on file    Discontinued Medications   No medications on file     Note:  This document was prepared using Dragon voice recognition software and may include unintentional dictation errors.    Hinda Kehr, MD 01/16/16 [phone]

## 2016-01-16 NOTE — Discharge Instructions (Signed)

## 2016-03-01 ENCOUNTER — Encounter: Payer: Self-pay | Admitting: Family Medicine

## 2016-03-01 ENCOUNTER — Ambulatory Visit (INDEPENDENT_AMBULATORY_CARE_PROVIDER_SITE_OTHER): Payer: 59 | Admitting: Family Medicine

## 2016-03-01 VITALS — BP 174/85 | HR 79 | Temp 98.0°F | Resp 16 | Ht 65.0 in | Wt 353.0 lb

## 2016-03-01 DIAGNOSIS — N760 Acute vaginitis: Secondary | ICD-10-CM

## 2016-03-01 DIAGNOSIS — R05 Cough: Secondary | ICD-10-CM

## 2016-03-01 DIAGNOSIS — R7303 Prediabetes: Secondary | ICD-10-CM | POA: Diagnosis not present

## 2016-03-01 DIAGNOSIS — Z6841 Body Mass Index (BMI) 40.0 and over, adult: Secondary | ICD-10-CM

## 2016-03-01 DIAGNOSIS — Z7689 Persons encountering health services in other specified circumstances: Secondary | ICD-10-CM | POA: Diagnosis not present

## 2016-03-01 DIAGNOSIS — N3946 Mixed incontinence: Secondary | ICD-10-CM | POA: Insufficient documentation

## 2016-03-01 DIAGNOSIS — J3089 Other allergic rhinitis: Secondary | ICD-10-CM

## 2016-03-01 DIAGNOSIS — I10 Essential (primary) hypertension: Secondary | ICD-10-CM

## 2016-03-01 DIAGNOSIS — R053 Chronic cough: Secondary | ICD-10-CM

## 2016-03-01 MED ORDER — FLUTICASONE PROPIONATE 50 MCG/ACT NA SUSP: 2.0000 | Freq: Every day | NASAL | 3 refills | Status: DC

## 2016-03-01 MED ORDER — FLUCONAZOLE 150 MG PO TABS: ORAL_TABLET | ORAL | 0 refills | Status: DC

## 2016-03-01 MED ORDER — LORATADINE 10 MG PO TABS: 10.0000 mg | ORAL_TABLET | Freq: Every day | ORAL | 11 refills | Status: DC

## 2016-03-01 MED ORDER — CLOTRIMAZOLE-BETAMETHASONE 1-0.05 % EX CREA: TOPICAL_CREAM | CUTANEOUS | 2 refills | Status: DC

## 2016-03-01 MED ORDER — HYDROCHLOROTHIAZIDE 25 MG PO TABS: 25.0000 mg | ORAL_TABLET | Freq: Every day | ORAL | 3 refills | Status: DC

## 2016-03-01 NOTE — Patient Instructions (Signed)
Thank you for coming in to clinic today.  1. For Blood Pressure - Significantly elevated on multiple readings - Recommend start checking BP outside office at pharmacy, write down readings, if persistently >140/90 - Start taking Hydrochlorothiazide (HCTZ) 25mg  once daily - Stop Atenolol  2. Persistent cough - Start Flonase nasal spray 2 sprays in each nostril once daily for 4-6 weeks and maybe need longer - Start Loratadine 10mg  daily for allergy pill  3. For vaginal irritation - Take Diflucan for yeast infection - Use topical cream for yeast and steroid for up to 2 weeks then stop, cannot use for too long or this will cause irritation as well - May use powder to dry as well to keep this area clean  4. For contacting insurance company - Check what options are covered for Bariatric Surgery options also Non Surgical options, such as Nutritionist, Weight Management medications  Please schedule a follow-up appointment with Dr. Parks Ranger in 4-6 weeks [place] appointment for Annual Physical for blood work  If you have any other questions or concerns, please feel free to call the clinic or send a message through [place]. You may also schedule an earlier appointment if necessary.  [name], DO [place]

## 2016-03-01 NOTE — Progress Notes (Signed)
Subjective:    Patient ID: [name], female    DOB: 1960/11/09, 56 y.o.   MRN: [id number]  Shannon Small is a 56 y.o. female presenting on 03/01/2016 for [place]  Previously followed by [facility] and [facility]. Last visit >1 year ago.  HPI  CHRONIC HTN: Reports chronic history with abnormal BP but states that she does not adhere to medications regularly, states her BP is not elevated all time. Usually improves. Does not check regularly. Current Meds - Atenolol 50mg  daily   Reports poor compliance. Did not take med today. Tolerating well, w/o complaints. Lifestyle - No specific dietary changes, no regular exercise, limited by chronic leg pain Denies CP, dyspnea, HA, edema, dizziness / lightheadedness  Chronic Cough - Reports persistent coughing over past 3 months, has had initial URI illness in 12/2015 then diagnosed with influenza 01/16/16 in ED, treated with Hydrocodone cough syrup, also on Flonase in past >1 year ago but not using regularly anymore. - Not taking other cold medicine or anti-histamine - Describes persistent nagging cough with some clear mucus and sputum that is productive, otherwise feels good without URI symptoms currently - Second hand smoke for +40 years, husband previously smoked, mother in law, daughter, and at work - has known seasonal allergies - Denies hemoptysis, dyspnea, wheezing  Morbid Obesity BMI >58 / Chronic Plantar Fasciitis / Chronic Low Back Pain / Physical Disability - Chronic problem, current weight stable in 6 months with approx 355 lbs, history of poor dietary habits, limited options due to finances by report, will still admit to eating fast food. 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 Previously followed by Orthopedics 2009 (at [facility], but does not recall  office or physician) no longer able to afford follow-up. Alos previously seen by Podiatry last 2011, advised her nothing else to offer - Admits swelling worse on Right side of body in hand and foot  Pre-Diabetes - Reports prior history of abnormal glucose and diagnosis of Pre-Diabetes based on old A1c lab results, does not recall last one or reading. 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 - Previously treated with Solifenacin succinate 5mg  daily - Wears pad during day with leakage  Depression screen [facility] 2/9 03/01/2016  Decreased Interest 0  Down, Depressed, Hopeless 0  PHQ - 2 Score 0    Past Medical History:  Diagnosis Date  . Acid reflux 09/09/2014  . Adjustment disorder with depressed mood 11/14/2006   ASSESSMENT: Bereavement. Recommend counseling with pastor or psychologist.   . Benign neoplasm of skin 11/14/2006  . Cephalalgia 02/10/2006  . Chronic infection of sinus 09/09/2014  . Extreme obesity ([place]) 02/10/2006  . LBP (low back pain) 01/15/2007   Chronic back strain and spasms due to obesity.   . Sleep related leg cramps 09/09/2014   Past Surgical History:  Procedure Laterality Date  . ABDOMINAL HYSTERECTOMY    .  RHINOPLASTY  1982   Social History   Social History  . Marital status: Married    Spouse name: N/A  . Number of children: N/A  . Years of education: N/A   Occupational History  . Not on file.   Social History Main Topics  . Smoking status: Never Smoker  .  Smokeless tobacco: Never Used  . Alcohol use 0.0 oz/week     Comment: rarely  . Drug use: No  . Sexual activity: Not on file   Other Topics Concern  . Not on file   Social History Narrative  . No narrative on file   Family History  Problem Relation Age of Onset  . Stomach cancer Father   . Kidney cancer Father   . Diabetes Maternal Grandmother   . Stroke Maternal Grandmother   . Stroke Maternal Grandfather   . Heart disease Paternal Grandmother   . Stroke Paternal Grandfather   . Diabetes Mother   . Alcohol abuse Brother   . Breast cancer Paternal Aunt   . Lung cancer Paternal Aunt   . Bladder Cancer Neg Hx   . Prostate cancer Neg Hx    Current Outpatient Prescriptions on File Prior to Visit  Medication Sig  . ibuprofen (ADVIL,MOTRIN) 800 MG tablet Take 1 tablet (800 mg total) by mouth 3 (three) times daily.  . orphenadrine (NORFLEX) 100 MG tablet Take 1 tablet (100 mg total) by mouth 2 (two) times daily as needed for muscle spasms.  . mometasone-formoterol (DULERA) 100-5 MCG/ACT AERO Inhale 2 puffs into the lungs 2 (two) times daily.   No current facility-administered medications on file prior to visit.     Review of Systems  Constitutional: Negative for activity change, appetite change, chills, diaphoresis, fatigue, fever and unexpected weight change.  HENT: Positive for congestion and postnasal drip. Negative for hearing loss, sinus pressure and trouble swallowing.   Eyes: Negative for visual disturbance.  Respiratory: Positive for cough (chronic cough). Negative for apnea, chest tightness, shortness of breath, wheezing and stridor.   Cardiovascular: Negative for chest pain, palpitations and leg swelling.  Gastrointestinal: Negative for abdominal pain, constipation, diarrhea, nausea and vomiting.  Endocrine: Positive for polydipsia and polyuria. Negative for cold intolerance.  Genitourinary: Positive for urgency and vaginal discharge. Negative for difficulty urinating,  dysuria, frequency and hematuria.  Musculoskeletal: Positive for arthralgias (Right foot). Negative for back pain and neck pain.  Skin: Negative for rash.  Allergic/Immunologic: Positive for environmental allergies.  Neurological: Negative for dizziness, weakness, light-headedness, numbness and headaches.  Hematological: Negative for adenopathy.  Psychiatric/Behavioral: Negative for behavioral problems, dysphoric mood and sleep disturbance.   Per HPI unless specifically indicated above     Objective:    BP (!) 174/85 (BP Location: Left Arm, Patient Position: Sitting, Cuff Size: Normal)   Pulse 79   Temp 98 F (36.7 C) (Oral)   Resp 16   Ht 5\' 5"  (1.651 m)   Wt (!) 353 lb (160.1 kg)   SpO2 100%   BMI 58.74 kg/m   Wt Readings from Last 3 Encounters:  03/01/16 (!) 353 lb (160.1 kg)  01/15/16 (!) 355 lb (161 kg)  08/22/15 (!) 355 lb (161 kg)    Physical Exam  Constitutional: She is oriented to person, place, and time. She appears well-developed and well-nourished. No distress.  Chronically ill-appearing but currently well, comfortable, cooperative, obese  HENT:  Head: Normocephalic and atraumatic.  Mouth/Throat: Oropharynx is clear and moist.  Nares mostly patent with some turbinate edema  and congestion without purulence. Oropharynx clear without erythema, exudates, edema or asymmetry.  Eyes: Conjunctivae are normal.  Neck: Normal range of motion. Neck supple.  Cardiovascular: Normal rate, regular rhythm, normal heart sounds and intact distal pulses.   No murmur heard. Pulmonary/Chest: Effort normal and breath sounds normal. No respiratory distress. She has no wheezes. She has no rales.  Good air movement. Speaks full sentences. Occasional coughing during visit  Abdominal: Soft. There is no tenderness.  Genitourinary:  Genitourinary Comments: Declined pelvic / genital exam today.  Musculoskeletal: Normal range of motion. She exhibits edema and tenderness (Right upper extremity  and Right lower extremity generalized).  Neurological: She is alert and oriented to person, place, and time.  Skin: Skin is warm and dry. No rash noted. She is not diaphoretic.  Psychiatric: She has a normal mood and affect. Her behavior is normal.  Nursing note and vitals reviewed.  I have personally reviewed the following lab results from 01/16/16.  Results for orders placed or performed during the hospital encounter of [id number]  Basic metabolic panel  Result Value Ref Range   Sodium 137 135 - 145 mmol/L   Potassium 3.8 3.5 - 5.1 mmol/L   Chloride 105 101 - 111 mmol/L   CO2 25 22 - 32 mmol/L   Glucose, Bld 100 (H) 65 - 99 mg/dL   BUN 18 6 - 20 mg/dL   Creatinine, Ser 0.77 0.44 - 1.00 mg/dL   Calcium 9.1 8.9 - 10.3 mg/dL   GFR calc non Af Amer >60 >60 mL/min   GFR calc Af Amer >60 >60 mL/min   Anion gap 7 5 - 15  CBC  Result Value Ref Range   WBC 6.6 3.6 - 11.0 K/uL   RBC 4.72 3.80 - 5.20 MIL/uL   Hemoglobin 13.6 12.0 - 16.0 g/dL   HCT 40.1 35.0 - 47.0 %   MCV 85.1 80.0 - 100.0 fL   MCH 28.7 26.0 - 34.0 pg   MCHC 33.8 32.0 - 36.0 g/dL   RDW 14.2 11.5 - 14.5 %   Platelets 200 150 - 440 K/uL  Troponin I  Result Value Ref Range   Troponin I <0.03 <0.03 ng/mL      Assessment & Plan:   Problem List Items Addressed This Visit    Urinary incontinence, mixed    Persistent problem, secondary to mixed incontinence in setting of hypermobile bladder neck / outlet abnormality - Followed by [facility] - Likely etiology for secondary vaginal yeast infection due to incontinence - Treat yeast infection, oral and topical given, routine hygiene - Follow-up with Urology as indicated in future for further management, again seems to decline this option due to finances and stated limited options based on her weight      Pre-diabetes    By history, unsure current control, no recent A1c available No prior medication or treatment  Plan: 1. Follow-up 4-6 weeks annual physical, check  A1c and other labs      Morbid obesity with BMI of 50.0-59.9, adult (HCC)    Persistent chronic problem, weight recently unchanged. Concern for etiology of multiple co-morbidities - Pre-DM, uncontrolled HTN, chronic joint and back pain, R plantar fasciitis, limiting her mobility  Plan: 1. Discussion on options, seems to be limited by poor financial situation, not ideal candidate for weight loss medication initially, will exhaust other options first, otherwise can consider Contrave and other options in future if needed, possibly if A1c is elevated would be ideal candidate for GLP1 2.  Advised patient to check with insurance next to see what Bariatric Surgical and Non-surgical options covered, she declined referral to [place] (has seen nutritionist before) 3. Follow-up 4-6 weeks check fasting labs, CMET, Lipids, A1c      Hypertension    Uncontrolled, chronic problem. Poor medication adherence only on BB Without known complication  Plan: 1. Stop Atenolol, since not adhering to this medication. Switch to HCTZ 25mg  daily, discussion on potential side effects, with diuretic component may help her swelling as well. 2. Check outside BP readings, bring log to next visit 3. Counseling on low salt healthier diet, limited exercise due to foot / back pain 4. Follow-up 4-6 weeks for BP, Annual physical, will check all baseline labs at that time, CMET, Lipids, A1c, consider adding ARB at that time (avoid amlodipine due to concern swelling and avoid ACEi with chronic cough)      Relevant Medications   hydrochlorothiazide (HYDRODIURIL) 25 MG tablet   Chronic cough    Unclear exact etiology, likely component from chronic allergic rhinosinusitis, post nasal drip - Not on ACEi, no other likely trigger medication wise  Plan: 1. Re-start Flonase regular use up to 4-6 weeks 2. Start Loratadine 10mg  daily anti-histamine as well 3. No indication for antibiotics, if not improving can consider  this option      Relevant Medications   fluticasone (FLONASE) 50 MCG/ACT nasal spray   Allergic rhinitis due to allergen    Likely contributing to chronic cough / sinus symptoms - Start Flonase, Loratadine, follow-up      Relevant Medications   fluticasone (FLONASE) 50 MCG/ACT nasal spray   loratadine (CLARITIN) 10 MG tablet    Other Visit Diagnoses    Encounter to establish care with new doctor    -  Primary   Vaginitis and vulvovaginitis       Treat emiprically with Diflucan first, then can use topical anti-fungal/steroid as requested that has worked for her in past, follow-up if not improving   Relevant Medications   clotrimazole-betamethasone (LOTRISONE) cream   fluconazole (DIFLUCAN) 150 MG tablet      Meds ordered this encounter  Medications  . hydrochlorothiazide (HYDRODIURIL) 25 MG tablet    Sig: Take 1 tablet (25 mg total) by mouth daily.    Dispense:  90 tablet    Refill:  3  . clotrimazole-betamethasone (LOTRISONE) cream    Sig: Apply topically to genital area for vaginal itching and irritation up to 2 weeks then stop.    Dispense:  15 g    Refill:  2  . fluconazole (DIFLUCAN) 150 MG tablet    Sig: Take one tablet by mouth on Day 1. Repeat dose 2nd tablet on Day 3.    Dispense:  2 tablet    Refill:  0  . fluticasone (FLONASE) 50 MCG/ACT nasal spray    Sig: Place 2 sprays into both nostrils daily. Use for 4-6 weeks then stop and use seasonally or as needed.    Dispense:  16 g    Refill:  3  . loratadine (CLARITIN) 10 MG tablet    Sig: Take 1 tablet (10 mg total) by mouth daily. Use for 4-6 weeks then stop, and use as needed or seasonally    Dispense:  30 tablet    Refill:  11      Follow up plan: Return in about 4 weeks (around 03/29/2016) for Annual Physical.  [name], DO [facility] 03/02/2016, 11:40 AM

## 2016-03-02 DIAGNOSIS — R053 Chronic cough: Secondary | ICD-10-CM | POA: Insufficient documentation

## 2016-03-02 DIAGNOSIS — R05 Cough: Secondary | ICD-10-CM | POA: Insufficient documentation

## 2016-03-02 DIAGNOSIS — J309 Allergic rhinitis, unspecified: Secondary | ICD-10-CM | POA: Insufficient documentation

## 2016-03-02 NOTE — Assessment & Plan Note (Signed)
Persistent problem, secondary to mixed incontinence in setting of hypermobile bladder neck / outlet abnormality - Followed by [facility] - Likely etiology for secondary vaginal yeast infection due to incontinence - Treat yeast infection, oral and topical given, routine hygiene - Follow-up with Urology as indicated in future for further management, again seems to decline this option due to finances and stated limited options based on her weight

## 2016-03-02 NOTE — Assessment & Plan Note (Addendum)
Unclear exact etiology, likely component from chronic allergic rhinosinusitis, post nasal drip - Not on ACEi, no other likely trigger medication wise  Plan: 1. Re-start Flonase regular use up to 4-6 weeks 2. Start Loratadine 10mg  daily anti-histamine as well 3. No indication for antibiotics, if not improving can consider this option

## 2016-03-02 NOTE — Assessment & Plan Note (Signed)
Likely contributing to chronic cough / sinus symptoms - Start Flonase, Loratadine, follow-up

## 2016-03-02 NOTE — Assessment & Plan Note (Addendum)
Persistent chronic problem, weight recently unchanged. Concern for etiology of multiple co-morbidities - Pre-DM, uncontrolled HTN, chronic joint and back pain, R plantar fasciitis, limiting her mobility  Plan: 1. Discussion on options, seems to be limited by poor financial situation, not ideal candidate for weight loss medication initially, will exhaust other options first, otherwise can consider Contrave and other options in future if needed, possibly if A1c is elevated would be ideal candidate for GLP1 2. Advised patient to check with insurance next to see what Bariatric Surgical and Non-surgical options covered, she declined referral to [place] (has seen nutritionist before) 3. Follow-up 4-6 weeks check fasting labs, CMET, Lipids, A1c

## 2016-03-02 NOTE — Assessment & Plan Note (Addendum)
Uncontrolled, chronic problem. Poor medication adherence only on BB Without known complication  Plan: 1. Stop Atenolol, since not adhering to this medication. Switch to HCTZ 25mg  daily, discussion on potential side effects, with diuretic component may help her swelling as well. 2. Check outside BP readings, bring log to next visit 3. Counseling on low salt healthier diet, limited exercise due to foot / back pain 4. Follow-up 4-6 weeks for BP, Annual physical, will check all baseline labs at that time, CMET, Lipids, A1c, consider adding ARB at that time (avoid amlodipine due to concern swelling and avoid ACEi with chronic cough)

## 2016-03-02 NOTE — Assessment & Plan Note (Signed)
By history, unsure current control, no recent A1c available No prior medication or treatment  Plan: 1. Follow-up 4-6 weeks annual physical, check A1c and other labs

## 2016-03-25 ENCOUNTER — Other Ambulatory Visit: Payer: 59

## 2016-03-28 ENCOUNTER — Other Ambulatory Visit: Payer: Self-pay | Admitting: Family Medicine

## 2016-03-28 ENCOUNTER — Other Ambulatory Visit: Payer: Medicare Other

## 2016-03-28 DIAGNOSIS — Z0000 Encounter for general adult medical examination without abnormal findings: Secondary | ICD-10-CM

## 2016-03-28 LAB — COMPREHENSIVE METABOLIC PANEL
ALBUMIN: 4 g/dL (ref 3.6–5.1)
ALK PHOS: 84 U/L (ref 33–130)
ALT: 16 U/L (ref 6–29)
AST: 15 U/L (ref 10–35)
BILIRUBIN TOTAL: 0.5 mg/dL (ref 0.2–1.2)
BUN: 16 mg/dL (ref 7–25)
CALCIUM: 9.1 mg/dL (ref 8.6–10.4)
CO2: 25 mmol/L (ref 20–31)
Chloride: 106 mmol/L (ref 98–110)
Creat: 0.64 mg/dL (ref 0.50–1.05)
GLUCOSE: 93 mg/dL (ref 65–99)
POTASSIUM: 4 mmol/L (ref 3.5–5.3)
Sodium: 141 mmol/L (ref 135–146)
Total Protein: 6.7 g/dL (ref 6.1–8.1)

## 2016-03-28 LAB — LIPID PANEL
CHOL/HDL RATIO: 2.5 ratio (ref <5.0)
Cholesterol: 159 mg/dL (ref <200)
HDL: 63 mg/dL (ref >50)
LDL Cholesterol: 79 mg/dL (ref <100)
Triglycerides: 87 mg/dL (ref <150)
VLDL: 17 mg/dL (ref <30)

## 2016-03-29 LAB — HEMOGLOBIN A1C
HEMOGLOBIN A1C: 5.1 % (ref <5.7)
Mean Plasma Glucose: 100 mg/dL

## 2016-03-30 ENCOUNTER — Encounter: Payer: Medicare Other | Admitting: Family Medicine

## 2016-03-30 ENCOUNTER — Encounter: Payer: 59 | Admitting: Family Medicine

## 2016-04-08 ENCOUNTER — Telehealth: Payer: Self-pay | Admitting: Family Medicine

## 2016-04-08 ENCOUNTER — Ambulatory Visit (INDEPENDENT_AMBULATORY_CARE_PROVIDER_SITE_OTHER): Payer: 59 | Admitting: Family Medicine

## 2016-04-08 ENCOUNTER — Encounter: Payer: Self-pay | Admitting: Family Medicine

## 2016-04-08 VITALS — BP 148/88 | HR 85 | Temp 98.1°F | Resp 16 | Ht 65.0 in | Wt 346.0 lb

## 2016-04-08 DIAGNOSIS — Z0000 Encounter for general adult medical examination without abnormal findings: Secondary | ICD-10-CM

## 2016-04-08 DIAGNOSIS — Z6841 Body Mass Index (BMI) 40.0 and over, adult: Secondary | ICD-10-CM | POA: Diagnosis not present

## 2016-04-08 DIAGNOSIS — I10 Essential (primary) hypertension: Secondary | ICD-10-CM

## 2016-04-08 DIAGNOSIS — N3946 Mixed incontinence: Secondary | ICD-10-CM | POA: Diagnosis not present

## 2016-04-08 DIAGNOSIS — J3089 Other allergic rhinitis: Secondary | ICD-10-CM | POA: Diagnosis not present

## 2016-04-08 DIAGNOSIS — N760 Acute vaginitis: Secondary | ICD-10-CM

## 2016-04-08 DIAGNOSIS — R7303 Prediabetes: Secondary | ICD-10-CM

## 2016-04-08 MED ORDER — FLUCONAZOLE 150 MG PO TABS: ORAL_TABLET | ORAL | 2 refills | Status: DC

## 2016-04-08 NOTE — Progress Notes (Signed)
Subjective:    Patient ID: Shannon Small, female    DOB: 16-Mar-1960, 56 y.o.   MRN: [id number]  Shannon Small is a 56 y.o. female presenting on 04/08/2016 for Annual Exam (pt has GYN last papsmear last year)  Previously followed by [facility] and [facility]. Last visit >1 year ago.  HPI  CHRONIC HTN: - Has not been checking BP outside office, states has not had time multiple stressors with sick family member and other issues, she checks BP at [place] occasionally, does not have cuff at home - Also admits non adherence to new BP med HCTZ started last visit, she resumed yesterday Current Meds - HCTZ 25mg  daily (previously on monotherapy BB - Atenolol 50mg  daily, switched last visit and started on HCTZ)  Reports poor compliance. Did take med today. Tolerating well, w/o complaints.  Right Elbow Pain, Sprain - Reports new problem about 2 weeks ago lifted a large case of bottled water and hurt her R elbow, it was not swollen or bruised, but remained sore, it has gradually improved, still bothers her if she leans on her R elbow. - Taking occasional Ibuprofen 800mg  1-2x daily and some Tylenol with relief  Chronic Cough / Allergic Rhinitis: - Reports chronic cough for >3 months, prior URI. She was started on loratadine and Flonase last visit, used this initially with good results then stopped, now only uses as needed - Second hand smoke for +40 years, husband previously smoked, mother in law, daughter, and at work - has known seasonal allergies  Morbid Obesity BMI >58 - Chronic problem, on physical disability with chronic plantar fasciitis, and chronic LBP with OA/DJD and followed by Ortho and Podiatry in the past, but unable to return. Attributes a lot of her symptoms to her weight - Approx 7-10 lb wt loss in 1-2 months, she is unsure what the cause of this is as admits no significant change in her lifestyle - Admits chronic swelling worse on Right side of body in hand and  foot - She did not explore bariatric options (surgical vs non surgical from her insurance company) she is aware of what options are out there, but does not know what is covered  Pre-Diabetes: - Chronic history of Pre-DM, now last A1c 5.1, good improvement overall. Do not have labs for comparison, also fasting glucose 93. 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 No longer taking bladder spasm medications. Not interested to repeat at this time. Still wearing pads for leakage. - Admits yeast antibiotic helped last time, and the topical cream was effective, has had to re-use since, and request refill diflucan, with some recent itching and discharge - States she would like to lose weight to help this area  Health Maintenance: - Last colonoscopy 2011, next due 2021, was told no polyps, done at  [facility], do not have record available - Last pap smear 2017, negative, next due 3-5 years 2022, do not have record available - UTD TDap, routine Hep C / HIV screening, Mammogram (2017)  Depression screen PHQ 2/9 03/01/2016  Decreased Interest 0  Down, Depressed, Hopeless 0  PHQ - 2 Score 0    Past Medical History:  Diagnosis Date  . Acid reflux 09/09/2014  . Adjustment disorder with depressed  mood 11/14/2006   ASSESSMENT: Bereavement. Recommend counseling with pastor or psychologist.   . Benign neoplasm of skin 11/14/2006  . Cephalalgia 02/10/2006  . Chronic infection of sinus 09/09/2014  . Extreme obesity ([place]) 02/10/2006  . LBP (low back pain) 01/15/2007   Chronic back strain and spasms due to obesity.   . Sleep related leg cramps 09/09/2014   Past Surgical History:  Procedure Laterality Date  . ABDOMINAL HYSTERECTOMY    . RHINOPLASTY  1982   Social History   Social History  . Marital status: Married    Spouse name: N/A  . Number of children: N/A  . Years of education: N/A   Occupational History  . Not on file.   Social History Main Topics  . Smoking status: Never Smoker  . Smokeless tobacco: Never Used  . Alcohol use 0.0 oz/week     Comment: rarely  . Drug use: No  . Sexual activity: Not on file   Other Topics Concern  . Not on file   Social History Narrative  . No narrative on file   Family History  Problem Relation Age of Onset  . Stomach cancer Father   . Kidney cancer Father   . Diabetes Maternal Grandmother   . Stroke Maternal Grandmother   . Stroke Maternal Grandfather   . Heart disease Paternal Grandmother   . Stroke Paternal Grandfather   . Diabetes Mother   . Alcohol abuse Brother   . Breast cancer Paternal Aunt   . Lung cancer Paternal Aunt   . Bladder Cancer Neg Hx   . Prostate cancer Neg Hx    Current Outpatient Prescriptions on File Prior to Visit  Medication Sig  . clotrimazole-betamethasone (LOTRISONE) cream Apply topically to genital area for vaginal itching and irritation up to 2 weeks then stop.  . fluticasone (FLONASE) 50 MCG/ACT nasal spray Place 2 sprays into both nostrils daily. Use for 4-6 weeks then stop and use seasonally or as needed.  . hydrochlorothiazide (HYDRODIURIL) 25 MG tablet Take 1 tablet (25 mg total) by mouth daily.  Marland Kitchen ibuprofen (ADVIL,MOTRIN) 800 MG tablet Take 1 tablet (800 mg total) by mouth 3 (three) times  daily.  Marland Kitchen loratadine (CLARITIN) 10 MG tablet Take 1 tablet (10 mg total) by mouth daily. Use for 4-6 weeks then stop, and use as needed or seasonally  . orphenadrine (NORFLEX) 100 MG tablet Take 1 tablet (100 mg total) by mouth 2 (two) times daily as needed for muscle spasms.  . mometasone-formoterol (DULERA) 100-5 MCG/ACT AERO Inhale 2 puffs into the lungs 2 (two) times daily.   No current facility-administered medications on file prior to visit.     Review of Systems  Constitutional: Negative for activity change, appetite change, chills, diaphoresis, fatigue, fever and unexpected weight change.  HENT: Negative for congestion, hearing loss, postnasal drip, sinus pressure and trouble swallowing.   Eyes: Negative for visual disturbance.  Respiratory: Negative for apnea, cough (improved, prior chronic cough), chest tightness, shortness of breath, wheezing and stridor.   Cardiovascular: Negative for chest pain, palpitations and  leg swelling.  Gastrointestinal: Negative for abdominal pain, anal bleeding, constipation, diarrhea, nausea and vomiting.  Endocrine: Positive for polydipsia and polyuria. Negative for cold intolerance.  Genitourinary: Positive for urgency and vaginal discharge (recurrent now). Negative for difficulty urinating, dysuria, frequency and hematuria.  Musculoskeletal: Positive for arthralgias (R elbow). Negative for back pain and neck pain.  Skin: Negative for rash.  Allergic/Immunologic: Positive for environmental allergies.  Neurological: Negative for dizziness, weakness, light-headedness, numbness and headaches.  Hematological: Negative for adenopathy.  Psychiatric/Behavioral: Negative for behavioral problems, dysphoric mood and sleep disturbance. The patient is not nervous/anxious.    Per HPI unless specifically indicated above     Objective:    BP (!) 148/88 (BP Location: Other (Comment), Cuff Size: Normal) Comment (BP Location): left forearm  Pulse 85   Temp 98.1  F (36.7 C) (Oral)   Resp 16   Ht 5\' 5"  (1.651 m)   Wt (!) 346 lb (156.9 kg)   BMI 57.58 kg/m   Wt Readings from Last 3 Encounters:  04/08/16 (!) 346 lb (156.9 kg)  03/01/16 (!) 353 lb (160.1 kg)  01/15/16 (!) 355 lb (161 kg)    Physical Exam  Constitutional: She is oriented to person, place, and time. She appears well-developed and well-nourished. No distress.  Currently well, comfortable, cooperative, obese  HENT:  Head: Normocephalic and atraumatic.  Mouth/Throat: Oropharynx is clear and moist.  Nares patent without purulence or edema. Bilateral TMs clear without erythema, effusion or bulging. Oropharynx clear without erythema, exudates, edema or asymmetry.  Eyes: Conjunctivae are normal.  Neck: Normal range of motion. Neck supple.  Cardiovascular: Normal rate, regular rhythm, normal heart sounds and intact distal pulses.   No murmur heard. Pulmonary/Chest: Effort normal and breath sounds normal. No respiratory distress. She has no wheezes. She has no rales.  Good air movement  Abdominal: Soft. There is no tenderness.  Genitourinary:  Genitourinary Comments: Declined pelvic / genital exam today.  Musculoskeletal: Normal range of motion. She exhibits edema (chronic bilateral lower extremity, stable). She exhibits no tenderness.  R elbow Inspection: normal appearance, large body habitus, no bruising or erythema, no edema Palpation: mild tender ulnar aspect of elbow tendons and over olecranon ROM: intact full ROM elbow flex/ext Strength: upper ext distal grip, forearm, deltoid 5/5 Neurovascular: distally intact  Neurological: She is alert and oriented to person, place, and time.  Skin: Skin is warm and dry. No rash noted. She is not diaphoretic.  Psychiatric: She has a normal mood and affect. Her behavior is normal.  Nursing note and vitals reviewed.  I have personally reviewed the following lab results from 03/2016.  Results for orders placed or performed in visit on  03/28/16  Comprehensive Metabolic Panel (CMET)  Result Value Ref Range   Sodium 141 135 - 146 mmol/L   Potassium 4.0 3.5 - 5.3 mmol/L   Chloride 106 98 - 110 mmol/L   CO2 25 20 - 31 mmol/L   Glucose, Bld 93 65 - 99 mg/dL   BUN 16 7 - 25 mg/dL   Creat 0.64 0.50 - 1.05 mg/dL   Total Bilirubin 0.5 0.2 - 1.2 mg/dL   Alkaline Phosphatase 84 33 - 130 U/L   AST 15 10 - 35 U/L   ALT 16 6 - 29 U/L   Total Protein 6.7 6.1 - 8.1 g/dL   Albumin 4.0 3.6 - 5.1 g/dL   Calcium 9.1 8.6 - 10.4 mg/dL  Lipid Profile  Result Value Ref Range   Cholesterol 159 <  200 mg/dL   Triglycerides 87 <150 mg/dL   HDL 63 >50 mg/dL   Total CHOL/HDL Ratio 2.5 <5.0 Ratio   VLDL 17 <30 mg/dL   LDL Cholesterol 79 <100 mg/dL  HgB A1c  Result Value Ref Range   Hgb A1c MFr Bld 5.1 <5.7 %   Mean Plasma Glucose 100 mg/dL      Assessment & Plan:   Problem List Items Addressed This Visit    Urinary incontinence, mixed    Unchanged, persistent problem, secondary to mixed incontinence in setting of hypermobile bladder neck / outlet abnormality - Followed by [facility] (unable to return at this time due to finances, and previously limited options given weight) - Likely etiology for secondary vaginal yeast infection due to incontinence - Again will treat yeast infection, oral and topical given, routine hygiene - refills on meds if recurrence given      Pre-diabetes    Well controlled based on last result 5.1, clinically significant concerns remain given sedentary lifestyle, not following DM low carb diet, and morbid obesity BMI >58  Plan: 1. Monitor A1c q 6-12 months, or sooner as needed 2. Encourage lifestyle modifications - see A&P      Morbid obesity with BMI of 50.0-59.9, adult ([place])    Persistent chronic problem, weight recently actually lower 7-10 lbs, patient unable to attribute this to any lifestyle changes. Concern for etiology of multiple co-morbidities - Pre-DM (A1c 5.1 well controlled, not  diabetic), uncontrolled HTN, chronic joint and back pain, R plantar fasciitis, limiting her mobility  Plan: 1. Discussion on options again similar to last visit - seems to be limited by poor financial situation, not ideal candidate for weight loss medication initially, will exhaust other options first 2. Again - advised patient to check with insurance next to see what Bariatric Surgical and Non-surgical options covered, she declined referral to [place] (has seen nutritionist before) 3. Follow-up 6 months for wt      Hypertension    Remains poorly controlled chronically, largely attributed to morbid obesity wt and very poor medication adherence, she did not comply with prior therapy, now over past 1 month new med HCTZ not taking regularly, just resumed taking it yesterday Without known complication  Plan: 1. Continue HCTZ 25mg  daily - advised that she needs to adhere to therapy regularly and check BP outside office so I can manage this problem, otherwise it is extremely challenging to treat her HTN. Offered 2nd agent today given suspected still uncontrolled on limited data, likely will add ARB / Losartan (to avoid chronic cough concerns with possible ACEi - although never on ACEi), hold off on Amlodipine due to concerns with already chronic swelling 2. Check outside BP readings, bring log to next visit 3. Counseling on low salt healthier diet, low carb/calorie for wt loss, limited exercise due to foot / back pain 4. Follow-up 6 months HTN - offered to monitor BP sooner, patient declined due to financial reasons, will return sooner if BP elevated peristently      Allergic rhinitis due to allergen    Improved, stable Advised resume Flonase regularly if worsening problem       Other Visit Diagnoses    Annual physical exam    -  Primary   Vaginitis and vulvovaginitis       Treat emiprically with Diflucan first, then can use topical anti-fungal/steroid as requested that has worked  for her in past, follow-up if not improving   Relevant Medications  fluconazole (DIFLUCAN) 150 MG tablet      Meds ordered this encounter  Medications  .       . fluconazole (DIFLUCAN) 150 MG tablet    Sig: Take one tablet by mouth on Day 1. Repeat dose 2nd tablet on Day 3.    Dispense:  2 tablet    Refill:  2      Follow up plan: Return in about 6 months (around 10/09/2016) for blood pressure, weight.  [name], [facility] 04/08/2016, 1:18 PM

## 2016-04-08 NOTE — Assessment & Plan Note (Signed)
Unchanged, persistent problem, secondary to mixed incontinence in setting of hypermobile bladder neck / outlet abnormality - Followed by [facility] (unable to return at this time due to finances, and previously limited options given weight) - Likely etiology for secondary vaginal yeast infection due to incontinence - Again will treat yeast infection, oral and topical given, routine hygiene - refills on meds if recurrence given

## 2016-04-08 NOTE — Telephone Encounter (Signed)
error 

## 2016-04-08 NOTE — Assessment & Plan Note (Signed)
Persistent chronic problem, weight recently actually lower 7-10 lbs, patient unable to attribute this to any lifestyle changes. Concern for etiology of multiple co-morbidities - Pre-DM (A1c 5.1 well controlled, not diabetic), uncontrolled HTN, chronic joint and back pain, R plantar fasciitis, limiting her mobility  Plan: 1. Discussion on options again similar to last visit - seems to be limited by poor financial situation, not ideal candidate for weight loss medication initially, will exhaust other options first 2. Again - advised patient to check with insurance next to see what Bariatric Surgical and Non-surgical options covered, she declined referral to [place] (has seen nutritionist before) 3. Follow-up 6 months for wt

## 2016-04-08 NOTE — Assessment & Plan Note (Signed)
Well controlled based on last result 5.1, clinically significant concerns remain given sedentary lifestyle, not following DM low carb diet, and morbid obesity BMI >58  Plan: 1. Monitor A1c q 6-12 months, or sooner as needed 2. Encourage lifestyle modifications - see A&P

## 2016-04-08 NOTE — Assessment & Plan Note (Signed)
Improved, stable Advised resume Flonase regularly if worsening problem

## 2016-04-08 NOTE — Patient Instructions (Addendum)
Thank you for coming in to clinic today.  1. For Blood Pressure - Recommend start checking BP outside office at pharmacy, write down readings, if persistently >140/90 - Try to take BP med EVERY day Hydrochlorothiazide (HCTZ) 25mg  once daily - Call office if BP is elevated then we will add new pill, Losartan 50mg  daily, would need to see you in about 2 weeks after starting this new medicine if you start it, so we can check blood work  For vaginal irritation - Take Diflucan for yeast infection, have refills - Use topical cream for yeast and steroid for up to 2 weeks then stop, cannot use for too long or this will cause irritation as well - May use powder to dry as well to keep this area clean  4. For contacting insurance company - Check what options are covered for Bariatric Surgery options also Non Surgical options, such as Nutritionist, Weight Management medications  Please schedule a follow-up appointment with Dr. Parks Ranger in 6 months HTN  If you have any other questions or concerns, please feel free to call the clinic or send a message through [place]. You may also schedule an earlier appointment if necessary.  Shannon Putnam, DO [place]

## 2016-04-08 NOTE — Assessment & Plan Note (Signed)
Remains poorly controlled chronically, largely attributed to morbid obesity wt and very poor medication adherence, she did not comply with prior therapy, now over past 1 month new med HCTZ not taking regularly, just resumed taking it yesterday Without known complication  Plan: 1. Continue HCTZ 25mg  daily - advised that she needs to adhere to therapy regularly and check BP outside office so I can manage this problem, otherwise it is extremely challenging to treat her HTN. Offered 2nd agent today given suspected still uncontrolled on limited data, likely will add ARB / Losartan (to avoid chronic cough concerns with possible ACEi - although never on ACEi), hold off on Amlodipine due to concerns with already chronic swelling 2. Check outside BP readings, bring log to next visit 3. Counseling on low salt healthier diet, low carb/calorie for wt loss, limited exercise due to foot / back pain 4. Follow-up 6 months HTN - offered to monitor BP sooner, patient declined due to financial reasons, will return sooner if BP elevated peristently

## 2016-04-29 ENCOUNTER — Other Ambulatory Visit: Payer: Self-pay | Admitting: Nurse Practitioner

## 2016-04-29 ENCOUNTER — Telehealth: Payer: Self-pay

## 2016-04-29 MED ORDER — LISINOPRIL 10 MG PO TABS: 10.0000 mg | ORAL_TABLET | Freq: Every day | ORAL | 1 refills | Status: DC

## 2016-04-29 NOTE — Telephone Encounter (Signed)
Patient called reporting that Dr. Raliegh Ip told her to keep up with BP and let him know if is not going down.  He was going to add anything BP med if BP was not getting better.  Patient reports today BP reading of 175/83 at [facility].  The other day she said it was 153/84.  She did report having headaches.  Please advise.

## 2016-04-29 NOTE — Telephone Encounter (Signed)
I added lisinopril 10 mg.  Take 1 tablet once daily.  Continue to check BP and report to clinic as Dr. Raliegh Ip instructed.  Rx sent to [facility]

## 2016-04-29 NOTE — Telephone Encounter (Signed)
Pt advised.

## 2016-07-07 ENCOUNTER — Other Ambulatory Visit: Payer: Self-pay | Admitting: Family Medicine

## 2016-07-07 DIAGNOSIS — N760 Acute vaginitis: Secondary | ICD-10-CM

## 2016-07-07 MED ORDER — CLOTRIMAZOLE-BETAMETHASONE 1-0.05 % EX CREA: TOPICAL_CREAM | CUTANEOUS | 4 refills | Status: DC

## 2016-07-30 ENCOUNTER — Other Ambulatory Visit: Payer: Self-pay | Admitting: Family Medicine

## 2016-08-01 ENCOUNTER — Telehealth: Payer: Self-pay | Admitting: Family Medicine

## 2016-08-01 ENCOUNTER — Other Ambulatory Visit: Payer: Self-pay | Admitting: Family Medicine

## 2016-08-01 MED ORDER — ORPHENADRINE CITRATE ER 100 MG PO TB12: 100.0000 mg | ORAL_TABLET | Freq: Two times a day (BID) | ORAL | 1 refills | Status: DC | PRN

## 2016-08-01 NOTE — Telephone Encounter (Signed)
Pt contacted office for refill request on the following medications:  orphenadrine (NORFLEX) 100 MG tablet   ibuprofen (ADVIL,MOTRIN) 800 MG tablet   Pt is requesting a 90 day supply.  [place]  HY#[phone]/QA

## 2016-08-01 NOTE — Telephone Encounter (Signed)
Pt needs refill on orphenadine sent to [facility].  Her call back number is [phone]

## 2016-08-01 NOTE — Telephone Encounter (Signed)
Please advise. It appears that patient has a new PCP in the chart.

## 2016-08-01 NOTE — Telephone Encounter (Signed)
Patient advised.

## 2016-08-01 NOTE — Telephone Encounter (Signed)
Refused refill. Has gotten established with Dr. Parks Ranger at the [facility]. Refills should be through that office.

## 2016-08-01 NOTE — Telephone Encounter (Signed)
Refilled Norflex  [name], DO [facility] 08/01/2016, 5:31 PM

## 2016-09-13 ENCOUNTER — Telehealth: Payer: Self-pay | Admitting: Family Medicine

## 2016-09-13 NOTE — Telephone Encounter (Signed)
Please provide patient some routine URI cold symptom advice. I usually recommend that she would continue anti-histamine Loratadine or Cetirizine 10mg  once daily to help dry up, may add DayQuil / NyQuil for cough and congestion and Mucinex-DM for up to 1 week.  She should follow-up if worsening up to >1 week symptoms or new concerns.  [name], [facility] 09/13/2016, 12:01 PM

## 2016-09-13 NOTE — Telephone Encounter (Signed)
Pt asked what she could take OTC for sore throat, cough, head congestion.  Her call back number is [phone]

## 2016-09-13 NOTE — Telephone Encounter (Signed)
Called patient has appointment tomorrow.

## 2016-09-14 ENCOUNTER — Ambulatory Visit (INDEPENDENT_AMBULATORY_CARE_PROVIDER_SITE_OTHER): Payer: 59 | Admitting: Family Medicine

## 2016-09-14 ENCOUNTER — Encounter: Payer: Self-pay | Admitting: Family Medicine

## 2016-09-14 VITALS — BP 171/65 | HR 80 | Temp 98.2°F | Resp 16 | Ht 65.0 in | Wt 342.6 lb

## 2016-09-14 DIAGNOSIS — B379 Candidiasis, unspecified: Secondary | ICD-10-CM | POA: Diagnosis not present

## 2016-09-14 DIAGNOSIS — L858 Other specified epidermal thickening: Secondary | ICD-10-CM | POA: Diagnosis not present

## 2016-09-14 DIAGNOSIS — J019 Acute sinusitis, unspecified: Secondary | ICD-10-CM

## 2016-09-14 DIAGNOSIS — T3695XA Adverse effect of unspecified systemic antibiotic, initial encounter: Secondary | ICD-10-CM

## 2016-09-14 DIAGNOSIS — L821 Other seborrheic keratosis: Secondary | ICD-10-CM

## 2016-09-14 MED ORDER — BENZONATATE 100 MG PO CAPS: 100.0000 mg | ORAL_CAPSULE | Freq: Three times a day (TID) | ORAL | 0 refills | Status: DC | PRN

## 2016-09-14 MED ORDER — IPRATROPIUM BROMIDE 0.06 % NA SOLN: 2.0000 | Freq: Four times a day (QID) | NASAL | 0 refills | Status: DC

## 2016-09-14 MED ORDER — FLUCONAZOLE 150 MG PO TABS: ORAL_TABLET | ORAL | 2 refills | Status: DC

## 2016-09-14 MED ORDER — AMOXICILLIN-POT CLAVULANATE 875-125 MG PO TABS: 1.0000 | ORAL_TABLET | Freq: Two times a day (BID) | ORAL | 0 refills | Status: DC

## 2016-09-14 NOTE — Patient Instructions (Addendum)
Thank you for coming to the clinic today.   1. Most likely sinusitis causing bronchitis Take augmentin antibiotic, may take Diflucan after for yeast Start Atrovent nasal spray decongestant 2 sprays in each nostril up to 4 times daily for 7 days Start Tessalon Perls take 1 capsule up to 3 times a day as needed for cough May try Mucinex OTC for up to 1 week to help clear congestion Drink plenty of fluids May increase potassium banana intake to help leg cramps, if need to switch med to Baclofen or other let me know  Referral if you don't hear back, call them to check status  [facility], [place] [address] Phone: [phone]  Arin L. Kellie Moor, MD ? Kirkland Hun, MD   Please schedule a Follow-up Appointment to: Return in about 2 weeks (around 09/28/2016), or if symptoms worsen or fail to improve, for sinusitis.  If you have any other questions or concerns, please feel free to call the clinic or send a message through [place]. You may also schedule an earlier appointment if necessary.  Additionally, you may be receiving a survey about your experience at our clinic within a few days to 1 week by e-mail or mail. We value your feedback.  [name], DO [place]

## 2016-09-14 NOTE — Progress Notes (Signed)
Subjective:    Patient ID: Shannon Small, female    DOB: 1960/08/31, 56 y.o.   MRN: [id number]  Shannon Small is a 56 y.o. female presenting on 09/14/2016 for Cough (nasal congestion ear pain HA throat hurts with sinus infection brown mucus onset 4 days)  Patient presents for a same day appointment.  HPI   URI CONGESTION COUGH - Reports new problem with worsening URI congestion sinus congestion drainage, productive coughing, sore throat, some back pain associated with coughing. Tried some OTC meds. In past most improved with amoxicillin or z-pak. - Admits multiple sick contacts at home granddaughter strep throat, other family members with URI and bronchitis - Admits sinus pain and pressure with congestion and rhinorrhea, associated headache, R > L ear pain but both abnormal and some burning - Denies dyspnea or shortness of breath, chest pain or tightness  ABNORMAL SKIN LESIONS: - Reports new concern now for several weeks to months unsure duration with several raised skin lesions hard firm dry, not itching or painful. She self removed one and has a slight scar or healing scab. Now concerned about one on her Left thigh but has several on R side. Has never seen Dermatologist. No known history of skin cancer. - Denies redness, drainage of pus, pain, itching, rash  Social History  Substance Use Topics  . Smoking status: Never Smoker  . Smokeless tobacco: Never Used  . Alcohol use 0.0 oz/week     Comment: rarely    Review of Systems Per HPI unless specifically indicated above     Objective:    BP (!) 171/65   Pulse 80   Temp 98.2 F (36.8 C) (Oral)   Resp 16   Ht 5\' 5"  (1.651 m)   Wt (!) 342 lb 9.6 oz (155.4 kg)   SpO2 100%   BMI 57.01 kg/m   Wt Readings from Last 3 Encounters:  09/14/16 (!) 342 lb 9.6 oz (155.4 kg)  04/08/16 (!) 346 lb (156.9 kg)  03/01/16 (!) 353 lb (160.1 kg)    Physical Exam  Constitutional: She is oriented to person, place, and time. She appears  well-developed and well-nourished. No distress.  Mildly ill appearing, frequent coughing, cooperative, morbidly obese  HENT:  Head: Normocephalic and atraumatic.  Mouth/Throat: Oropharynx is clear and moist.  Frontal / maxillary sinuses bilateral tender. Nares with congestion without purulence. Bilateral TMs with clear effusion L > R with slight bulging without erythema or purulence. Oropharynx with generalized non specific posterior erythema and post nasal drainage without exudates, edema or asymmetry.  Eyes: Conjunctivae are normal. Right eye exhibits no discharge. Left eye exhibits no discharge.  Neck: Normal range of motion. Neck supple. No thyromegaly present.  Cardiovascular: Normal rate, regular rhythm, normal heart sounds and intact distal pulses.   No murmur heard. Pulmonary/Chest: Effort normal. No respiratory distress. She has no wheezes. She has rales.  Bilateral lower > upper rhonchi coarse sounds improve with cough. No wheezing. Frequent coughing  Musculoskeletal: Normal range of motion. She exhibits no edema.  Lymphadenopathy:    She has no cervical adenopathy.  Neurological: She is alert and oriented to person, place, and time.  Skin: Skin is warm and dry. No rash noted. She is not diaphoretic. No erythema.  Several raised skin lesions consistent with seborrheic keratoses stuck on brown appearance, lower leg R > L with several with firm dried hard keratotic tops, one s/p self removal on R leg, L leg has inflamed spot without ulceration or  redness  Psychiatric: Her behavior is normal.  Nursing note and vitals reviewed.  Results for orders placed or performed in visit on 03/28/16  Comprehensive Metabolic Panel (CMET)  Result Value Ref Range   Sodium 141 135 - 146 mmol/L   Potassium 4.0 3.5 - 5.3 mmol/L   Chloride 106 98 - 110 mmol/L   CO2 25 20 - 31 mmol/L   Glucose, Bld 93 65 - 99 mg/dL   BUN 16 7 - 25 mg/dL   Creat 0.64 0.50 - 1.05 mg/dL   Total Bilirubin 0.5 0.2 - 1.2  mg/dL   Alkaline Phosphatase 84 33 - 130 U/L   AST 15 10 - 35 U/L   ALT 16 6 - 29 U/L   Total Protein 6.7 6.1 - 8.1 g/dL   Albumin 4.0 3.6 - 5.1 g/dL   Calcium 9.1 8.6 - 10.4 mg/dL  Lipid Profile  Result Value Ref Range   Cholesterol 159 <200 mg/dL   Triglycerides 87 <150 mg/dL   HDL 63 >50 mg/dL   Total CHOL/HDL Ratio 2.5 <5.0 Ratio   VLDL 17 <30 mg/dL   LDL Cholesterol 79 <100 mg/dL  HgB A1c  Result Value Ref Range   Hgb A1c MFr Bld 5.1 <5.7 %   Mean Plasma Glucose 100 mg/dL      Assessment & Plan:   Problem List Items Addressed This Visit    None    Visit Diagnoses    Acute rhinosinusitis    -  Primary  Consistent with acute rhinosinusitis, likely initially viral URI vs allergic rhinitis component with worsening concern for bacterial infection.  Plan: 1. Start Augmentin 875-125mg  PO BID x 10 days 2. Start Atrovent nasal spray decongestant 2 sprays in each nostril up to 4 times daily for 7 days 3. Start Tessalon Perls take 1 capsule up to 3 times a day as needed for cough 4. May try OTC mucinex and other supportive care - limit to 7 days avoid decongestant oral to inc HTN 5. May take anti-histamine and resume prior flonase 6. Supportive care, inc fluids 7. Return criteria reviewed     Relevant Medications   fluconazole (DIFLUCAN) 150 MG tablet   amoxicillin-clavulanate (AUGMENTIN) 875-125 MG tablet   benzonatate (TESSALON) 100 MG capsule   ipratropium (ATROVENT) 0.06 % nasal spray   Antibiotic-induced yeast infection       Relevant Medications   fluconazole (DIFLUCAN) 150 MG tablet   Seborrheic keratoses       Relevant Orders   Ambulatory referral to Dermatology   Keratoacanthoma       Relevant Orders   Ambulatory referral to Dermatology  Referral to [facility] for management of several SKs vs keratoacanthoma with keratotic horn, some inflamed, request further treatment with either cryo vs excision given multiple lesions and request, sent referral        Meds ordered this encounter  Medications  . fluconazole (DIFLUCAN) 150 MG tablet    Sig: Take one tablet by mouth on Day 1. Repeat dose 2nd tablet on Day 3.    Dispense:  2 tablet    Refill:  2  . amoxicillin-clavulanate (AUGMENTIN) 875-125 MG tablet    Sig: Take 1 tablet by mouth 2 (two) times daily. For 10 days    Dispense:  20 tablet    Refill:  0  . benzonatate (TESSALON) 100 MG capsule    Sig: Take 1 capsule (100 mg total) by mouth 3 (three) times daily as needed for cough.  Dispense:  30 capsule    Refill:  0  . ipratropium (ATROVENT) 0.06 % nasal spray    Sig: Place 2 sprays into both nostrils 4 (four) times daily. For up to 5-7 days then stop.    Dispense:  15 mL    Refill:  0    Follow up plan: Return in about 2 weeks (around 09/28/2016), or if symptoms worsen or fail to improve, for sinusitis.  [name], [facility] 09/14/2016, 10:13 AM

## 2016-09-19 ENCOUNTER — Telehealth: Payer: Self-pay | Admitting: Family Medicine

## 2016-09-19 NOTE — Telephone Encounter (Signed)
Last seen 09/14/16, given Augmentin , Tessalon, Atrovent for sinusitis. Now calls 5 days later with persistent cough, overall feels better with antibiotic, but cough is worse. She states Tessalon perls not working, wants new medicine. I advised similar to our discussion on 9/5 that she may add Mucinex-DM twice daily for 7 days OTC, this may help clear her cough/congestion, often cough gets worse as you feel better and it will need to clear, we cannot always cure the cough, did not offer other rx cough medicine at this time with limited other options. Follow-up as needed if not improved, may consider CXR if worsening and productive cough.  [name], [facility] 09/19/2016, 9:34 AM

## 2016-09-20 ENCOUNTER — Telehealth: Payer: Self-pay | Admitting: Family Medicine

## 2016-09-20 NOTE — Telephone Encounter (Signed)
Pt was in last week for cold and was told to take mucinex.  She asked about taking that with her high BP [phone]

## 2016-09-20 NOTE — Telephone Encounter (Signed)
As per Dr. Raliegh Ip advised pt to take Mucinex DM or can take Coricidin they both reccommended for pt with high B/P.

## 2016-09-21 ENCOUNTER — Telehealth: Payer: Self-pay | Admitting: Family Medicine

## 2016-09-21 NOTE — Telephone Encounter (Signed)
Called patient back, spoke on 09/21/16 at [place], reviewed her symptoms again, seems that she no longer feels the infection, she was treated on 09/14/16 with Augmentin among other meds for potential sinusitis, and now overall is improved but has persistent residual coughing and mucus production despite mucinex, also new problem with worsening laryngitis and hoarseness, she can still swallow PO fine and only minimal sore throat, only occasional low grade fever. She is still taking Augmentin has not finished yet. Asking what she can do for her throat and what is causing it, I advised her that most likely viral since the augmentin had not cleared it up yet, and viral pharyngitis is most common cause, it will take time to resolve same with lingering cough, these can be days to weeks. She may continue warm salt water gargles and also add warm herbal tea with honey to help.  Follow-up as needed if not improved after 1 full week of symptoms or may go to hospital or urgent care sooner if worsening and difficulty breathing or PO intake or change / persistent symptoms.  I did not have any other medications to offer and she was not interested in new rx.  [name], [facility] 09/21/2016, 9:18 AM

## 2016-09-21 NOTE — Telephone Encounter (Signed)
As per pt laryngitis getting worst barely can talk has some HA denies having fever or SOB or ear pain.

## 2016-10-04 ENCOUNTER — Encounter: Payer: Self-pay | Admitting: Family Medicine

## 2016-10-04 ENCOUNTER — Ambulatory Visit (INDEPENDENT_AMBULATORY_CARE_PROVIDER_SITE_OTHER): Payer: 59 | Admitting: Family Medicine

## 2016-10-04 VITALS — BP 161/70 | HR 84 | Temp 97.7°F | Resp 16 | Ht 65.0 in | Wt 345.0 lb

## 2016-10-04 DIAGNOSIS — R05 Cough: Secondary | ICD-10-CM | POA: Diagnosis not present

## 2016-10-04 DIAGNOSIS — J0110 Acute frontal sinusitis, unspecified: Secondary | ICD-10-CM | POA: Diagnosis not present

## 2016-10-04 DIAGNOSIS — J3089 Other allergic rhinitis: Secondary | ICD-10-CM | POA: Diagnosis not present

## 2016-10-04 DIAGNOSIS — R053 Chronic cough: Secondary | ICD-10-CM

## 2016-10-04 MED ORDER — HYDROCODONE-HOMATROPINE 5-1.5 MG/5ML PO SYRP: 5.0000 mL | ORAL_SOLUTION | Freq: Four times a day (QID) | ORAL | 0 refills | Status: DC | PRN

## 2016-10-04 MED ORDER — LEVOFLOXACIN 500 MG PO TABS: 500.0000 mg | ORAL_TABLET | Freq: Every day | ORAL | 0 refills | Status: DC

## 2016-10-04 MED ORDER — ALBUTEROL SULFATE HFA 108 (90 BASE) MCG/ACT IN AERS: 2.0000 | INHALATION_SPRAY | RESPIRATORY_TRACT | 0 refills | Status: DC | PRN

## 2016-10-04 MED ORDER — FLUTICASONE PROPIONATE 50 MCG/ACT NA SUSP: 2.0000 | Freq: Every day | NASAL | 3 refills | Status: DC

## 2016-10-04 NOTE — Progress Notes (Signed)
Subjective:    Patient ID: Shannon Small, female    DOB: 1960-07-21, 56 y.o.   MRN: [id number]  Shannon Small is a 56 y.o. female presenting on 10/04/2016 for Cough (yellowish mucus and back hurts while coughing HA throat burning and ear pain hasn't cleared up from past)  Patient presents for a same day appointment.  HPI   FOLLOW-UP SINUSITIS / Persistent Cough / Sore Throat - Last visit with me 09/14/16, for same problem as acute sick visit, treated with Augmentin, Tessalon perls, and Atrovent nasal spray, see prior notes for background information. - Interval update with called office multiple times in interval to follow-up with initial improved symptoms after 1 week but persistent cough, which had continued to worse and also worsened sore throat with irritation due to coughing. She tried several OTC cough remedies including Mucinex and Coricidin, without any relief - Today patient reports now concern with persistent cough, seems non productive, but causes throat to be more sore and also chest wall and back sore from coughing. Admits now worsening again sinus pain and pressure bilateral, with congestion, and ear pain and pressure. - Recent sick contacts at home, but seem to be improved - Additional history, she was newly started ACEi Lisinopril 10mg  in 04/2016 due to uncontrolled HTN, she is unsure if this seemed to cause a cough but she states cannot recall when the cough exactly started. - In past improved cough on codeine cough syrup - Admits stress urinary incontinence with persistent coughing - Denies dyspnea or shortness of breath, chest pain or tightness  Health Maintenance: - Due for Flu Shot, but again need to decline due to illness  Depression screen [facility] 2/9 03/01/2016  Decreased Interest 0  Down, Depressed, Hopeless 0  PHQ - 2 Score 0    Social History  Substance Use Topics  . Smoking status: Never Smoker  . Smokeless tobacco: Never Used  . Alcohol use 0.0 oz/week   Comment: rarely    Review of Systems Per HPI unless specifically indicated above     Objective:    BP (!) 161/70   Pulse 84   Temp 97.7 F (36.5 C) (Oral)   Resp 16   Ht 5\' 5"  (1.651 m)   Wt (!) 345 lb (156.5 kg)   SpO2 100%   BMI 57.41 kg/m   Wt Readings from Last 3 Encounters:  10/04/16 (!) 345 lb (156.5 kg)  09/14/16 (!) 342 lb 9.6 oz (155.4 kg)  04/08/16 (!) 346 lb (156.9 kg)    Physical Exam  Constitutional: She is oriented to person, place, and time. She appears well-developed and well-nourished. No distress.  Mostly well but tired and slightly ill appearing, frequent coughing, cooperative, morbidly obese  HENT:  Head: Normocephalic and atraumatic.  Mouth/Throat: Oropharynx is clear and moist.  Frontal / maxillary sinuses still mild bilateral tender. Nares with reduced congestion without purulence but L>R turbinate edema. Bilateral TMs with persistent clear effusion with fullness without erythema or purulence. Oropharynx with improved but still some generalized non specific posterior erythema without exudates, edema or asymmetry.  Eyes: Conjunctivae are normal. Right eye exhibits no discharge. Left eye exhibits no discharge.  Neck: Normal range of motion. Neck supple.  Cardiovascular: Normal rate, regular rhythm, normal heart sounds and intact distal pulses.   No murmur heard. Pulmonary/Chest: Effort normal. No respiratory distress. She has no wheezes. She has no rales.  Improved rhonchi, now some transmitted upper airway breath sounds with scattered wheeze not necessarily  consistent expiratory wheezing. Frequent coughing  Musculoskeletal: Normal range of motion. She exhibits no edema.  Lymphadenopathy:    She has no cervical adenopathy.  Neurological: She is alert and oriented to person, place, and time.  Skin: Skin is warm and dry. No rash noted. She is not diaphoretic. No erythema.  Psychiatric: Her behavior is normal.  Nursing note and vitals reviewed.  Results  for orders placed or performed in visit on 03/28/16  Comprehensive Metabolic Panel (CMET)  Result Value Ref Range   Sodium 141 135 - 146 mmol/L   Potassium 4.0 3.5 - 5.3 mmol/L   Chloride 106 98 - 110 mmol/L   CO2 25 20 - 31 mmol/L   Glucose, Bld 93 65 - 99 mg/dL   BUN 16 7 - 25 mg/dL   Creat 0.64 0.50 - 1.05 mg/dL   Total Bilirubin 0.5 0.2 - 1.2 mg/dL   Alkaline Phosphatase 84 33 - 130 U/L   AST 15 10 - 35 U/L   ALT 16 6 - 29 U/L   Total Protein 6.7 6.1 - 8.1 g/dL   Albumin 4.0 3.6 - 5.1 g/dL   Calcium 9.1 8.6 - 10.4 mg/dL  Lipid Profile  Result Value Ref Range   Cholesterol 159 <200 mg/dL   Triglycerides 87 <150 mg/dL   HDL 63 >50 mg/dL   Total CHOL/HDL Ratio 2.5 <5.0 Ratio   VLDL 17 <30 mg/dL   LDL Cholesterol 79 <100 mg/dL  HgB A1c  Result Value Ref Range   Hgb A1c MFr Bld 5.1 <5.7 %   Mean Plasma Glucose 100 mg/dL      Assessment & Plan:   Problem List Items Addressed This Visit    Allergic rhinitis due to allergen - Likely contributing to chronic cough, with known allergies. No off flonase still - Restart Flonase 2 sprays each nare daily, new rx sent    Relevant Medications   fluticasone (FLONASE) 50 MCG/ACT nasal spray   Chronic cough - Likely with sinusitis / rhinitis, and lingering post-viral cough, could have had re-infection with sick contacts, failed initial antibiotic therapy and OTC therapy - No clinical suspicion for pneumonia today no focal abnormality. No significant wheezing - Concern possible ACEi cough (new start lisinopril 04/2016) - Treat underlying sinusitis/bronchitis due to constellation of symptoms, see below A&P if not resolved then next step is HOLD Lisinopril and consider CXR, prednisone     Other Visit Diagnoses    Persistent cough for 3 weeks or longer    -  Primary   Relevant Medications   fluticasone (FLONASE) 50 MCG/ACT nasal spray   albuterol (PROVENTIL HFA;VENTOLIN HFA) 108 (90 Base) MCG/ACT inhaler   HYDROcodone-homatropine  (HYCODAN) 5-1.5 MG/5ML syrup   Subacute frontal sinusitis     Consistent with subacute >3 weeks frontal / maxillary sinusitis, likely initially viral URI vs allergic rhinitis component with worsening concern for bacterial infection - Not resolved on Augmentin  Plan: 1. Start Levaquin 500mg  daily x 7 days 2. Resume Flonase 3. Rx Albuterol inhaler for PRN use with some persistent cough and occasional wheezing 4. Rx cough syrup Hycodan, printed, checked [place] CSRS last fill 01/2016 5. Supportive care with nasal saline OTC, hydration, warm herbal tea with honey for cough 6. Return criteria reviewed - next options if not improved hold Lisinopril, CXR, Prednisone      Relevant Medications   fluticasone (FLONASE) 50 MCG/ACT nasal spray   levofloxacin (LEVAQUIN) 500 MG tablet   HYDROcodone-homatropine (HYCODAN) 5-1.5 MG/5ML syrup  Meds ordered this encounter  Medications  . fluticasone (FLONASE) 50 MCG/ACT nasal spray    Sig: Place 2 sprays into both nostrils daily. Use for 4-6 weeks then stop and use seasonally or as needed.    Dispense:  16 g    Refill:  3  . albuterol (PROVENTIL HFA;VENTOLIN HFA) 108 (90 Base) MCG/ACT inhaler    Sig: Inhale 2 puffs into the lungs every 4 (four) hours as needed for wheezing or shortness of breath (cough).    Dispense:  1 Inhaler    Refill:  0  . levofloxacin (LEVAQUIN) 500 MG tablet    Sig: Take 1 tablet (500 mg total) by mouth daily. For 7 days    Dispense:  7 tablet    Refill:  0  . HYDROcodone-homatropine (HYCODAN) 5-1.5 MG/5ML syrup    Sig: Take 5 mLs by mouth every 6 (six) hours as needed for cough.    Dispense:  120 mL    Refill:  0    Follow up plan: Return in about 2 weeks (around 10/18/2016), or if symptoms worsen or fail to improve, for Sinusitis / bronchitis.  [name], [facility] 10/04/2016, 11:39 AM

## 2016-10-04 NOTE — Patient Instructions (Addendum)
Thank you for coming to the clinic today.  1.  It sounds like you have a Sinusitis (Bacterial Infection) - this most likely started as an Upper Respiratory Virus that has settled into an infection. And persistent coughing can be secondary problem  - Start Levaquin 1 pill daily (breakfast, with food and plenty of water) for 7 days, complete entire course, do not stop early even if feeling better  - Start nasal steroid Flonase 2 sprays in each nostril daily for 4-6 weeks, may repeat course seasonally or as needed  Use cough syrup as needed every 12 hours  May use albuterol rescue inhaler every 4-6 hours 2 puffs as needed.  - Improve hydration by drinking plenty of clear fluids (water, gatorade) to reduce secretions and thin congestion  - Congestion draining down throat can cause irritation. May try warm herbal tea with honey, cough drops  - Can take Tylenol or Ibuprofen as needed for fevers  If you develop persistent fever >101F for at least 3 consecutive days, headaches with sinus pain or pressure or persistent earache, please schedule a follow-up evaluation within next few days to week.  Please schedule a Follow-up Appointment to: Return in about 2 weeks (around 10/18/2016), or if symptoms worsen or fail to improve, for Sinusitis / bronchitis.  If you have any other questions or concerns, please feel free to call the clinic or send a message through [place]. You may also schedule an earlier appointment if necessary.  Additionally, you may be receiving a survey about your experience at our clinic within a few days to 1 week by e-mail or mail. We value your feedback.  [name], DO [place]

## 2016-10-07 ENCOUNTER — Telehealth: Payer: Self-pay | Admitting: Nurse Practitioner

## 2016-10-07 NOTE — Telephone Encounter (Signed)
Pt has called same day requesting note to return to work on Monday instead of today.  Pt has had 3 days Levaquin antibiotics after visit w/ Dr. Parks Ranger on 9/25.  Pt should no longer be contagious and should be able to return to work.

## 2016-10-12 ENCOUNTER — Other Ambulatory Visit: Payer: Self-pay | Admitting: Family Medicine

## 2016-10-12 ENCOUNTER — Ambulatory Visit: Payer: Medicare Other | Admitting: Family Medicine

## 2016-10-27 ENCOUNTER — Telehealth: Payer: Self-pay

## 2016-10-27 NOTE — Telephone Encounter (Signed)
Advised patient as per Dr. Raliegh Ip and also suggest her that  We can refer her to ENT but as per patient she will call by herself to schedule if she needs ENT.

## 2016-10-27 NOTE — Telephone Encounter (Signed)
Patient has been has been seen in office twice in 6 weeks for same complaints, and has had several telephone calls for medication advice and new rx over that time. She has completed 2 rounds of antibiotics including augmentin and most recently levaquin, additionally has been on OTC cough medicine, mucinex, tessalon, flonase, hycodan cough syrup, among other medications.  I do not have any other treatments to offer her. From her description her symptoms sound most likely to be chronic sinusitis, this can be allergic and not necessarily bacterial. She should continue Loratadine or switch to Allegra or Zyrtec over the counter, and continue Flonase 2 sprays each nostril every day.  If she is significantly worsening, she may go seek more immediate attention at hospital ED. Otherwise I can refer her to an ENT physician to evaluate her sinuses further if she is interested.  [name], DO [facility] 10/27/2016, 12:41 PM

## 2016-10-27 NOTE — Telephone Encounter (Signed)
Patient called still c/o nagging cough, sore throat, a lot of clear mucus, chronic headache and fluid in ears.  Patient has had 2 rounds of abx and has been seen on 09/14/16 and 10/04/16.  Patient is requesting a call back ASAP

## 2016-11-29 ENCOUNTER — Telehealth: Payer: Self-pay | Admitting: Family Medicine

## 2016-11-29 NOTE — Telephone Encounter (Signed)
[place] pharmacy faxed a request on the following medication. Thanks CC  ibuprofen (ADVIL,MOTRIN) 800 MG tablet

## 2016-12-06 ENCOUNTER — Telehealth: Payer: Self-pay | Admitting: Family Medicine

## 2016-12-06 ENCOUNTER — Other Ambulatory Visit: Payer: Self-pay | Admitting: Family Medicine

## 2016-12-06 MED ORDER — IBUPROFEN 800 MG PO TABS: 800.0000 mg | ORAL_TABLET | Freq: Three times a day (TID) | ORAL | 1 refills | Status: DC

## 2016-12-06 NOTE — Telephone Encounter (Signed)
Pt. Called requesting a refill on   Ibuprofen called into  [facility]  [address]

## 2016-12-27 NOTE — Telephone Encounter (Signed)
Acknowledged.

## 2017-01-04 ENCOUNTER — Other Ambulatory Visit: Payer: Self-pay | Admitting: Family Medicine

## 2017-01-04 DIAGNOSIS — N760 Acute vaginitis: Secondary | ICD-10-CM

## 2017-01-09 ENCOUNTER — Telehealth: Payer: Self-pay | Admitting: Family Medicine

## 2017-01-09 NOTE — Telephone Encounter (Signed)
Pt has jury duty on Jan. 22nd and asked if she could get a note to be excused due to back and legs.  Her call back number is [phone]

## 2017-01-09 NOTE — Telephone Encounter (Signed)
Jury duty excuse

## 2017-01-09 NOTE — Telephone Encounter (Signed)
Please notify patient that I do not plan to write Jury Duty excuse due to back or leg pain.  Perhaps, I could write a letter stating that she will require a frequent break to avoid prolonged sitting longer than a certain duration to avoid back and leg pain and muscle spasm. But I cannot write a complete excuse for her based on this medical condition.  [name], DO [facility] 01/09/2017, 4:15 PM

## 2017-01-11 ENCOUNTER — Encounter: Payer: Self-pay | Admitting: Family Medicine

## 2017-01-11 NOTE — Telephone Encounter (Signed)
The pt states she would like for you to write and mail her  the letter stating she needs more frequent breaks during [employer].

## 2017-01-11 NOTE — Telephone Encounter (Signed)
Letter written, see for details. To be mailed to patient.  [name], DO [facility] 01/11/2017, 5:01 PM

## 2017-03-31 ENCOUNTER — Other Ambulatory Visit: Payer: Self-pay | Admitting: Family Medicine

## 2017-04-12 ENCOUNTER — Encounter: Payer: Self-pay | Admitting: Family Medicine

## 2017-04-12 ENCOUNTER — Ambulatory Visit (INDEPENDENT_AMBULATORY_CARE_PROVIDER_SITE_OTHER): Payer: 59 | Admitting: Family Medicine

## 2017-04-12 VITALS — BP 177/73 | HR 80 | Temp 97.8°F | Resp 16 | Ht 65.0 in | Wt 339.0 lb

## 2017-04-12 DIAGNOSIS — N760 Acute vaginitis: Secondary | ICD-10-CM

## 2017-04-12 DIAGNOSIS — Z0001 Encounter for general adult medical examination with abnormal findings: Secondary | ICD-10-CM

## 2017-04-12 DIAGNOSIS — M545 Low back pain, unspecified: Secondary | ICD-10-CM

## 2017-04-12 DIAGNOSIS — Z1239 Encounter for other screening for malignant neoplasm of breast: Secondary | ICD-10-CM

## 2017-04-12 DIAGNOSIS — Z0000 Encounter for general adult medical examination without abnormal findings: Secondary | ICD-10-CM

## 2017-04-12 DIAGNOSIS — L309 Dermatitis, unspecified: Secondary | ICD-10-CM

## 2017-04-12 DIAGNOSIS — Z6841 Body Mass Index (BMI) 40.0 and over, adult: Secondary | ICD-10-CM | POA: Diagnosis not present

## 2017-04-12 DIAGNOSIS — I10 Essential (primary) hypertension: Secondary | ICD-10-CM

## 2017-04-12 DIAGNOSIS — G8929 Other chronic pain: Secondary | ICD-10-CM | POA: Diagnosis not present

## 2017-04-12 MED ORDER — CLOTRIMAZOLE-BETAMETHASONE 1-0.05 % EX CREA: TOPICAL_CREAM | CUTANEOUS | 1 refills | Status: DC

## 2017-04-12 MED ORDER — LISINOPRIL 10 MG PO TABS: 10.0000 mg | ORAL_TABLET | Freq: Every day | ORAL | 3 refills | Status: DC

## 2017-04-12 MED ORDER — ORPHENADRINE CITRATE ER 100 MG PO TB12: ORAL_TABLET | ORAL | 2 refills | Status: DC

## 2017-04-12 MED ORDER — FLUCONAZOLE 150 MG PO TABS: ORAL_TABLET | ORAL | 2 refills | Status: DC

## 2017-04-12 MED ORDER — HYDROCHLOROTHIAZIDE 25 MG PO TABS: 25.0000 mg | ORAL_TABLET | Freq: Every day | ORAL | 3 refills | Status: DC

## 2017-04-12 MED ORDER — IBUPROFEN 800 MG PO TABS: 800.0000 mg | ORAL_TABLET | Freq: Three times a day (TID) | ORAL | 1 refills | Status: DC | PRN

## 2017-04-12 MED ORDER — HYDROCORTISONE 2.5 % EX CREA: TOPICAL_CREAM | Freq: Two times a day (BID) | CUTANEOUS | 3 refills | Status: DC | PRN

## 2017-04-12 MED ORDER — AMLODIPINE BESYLATE 10 MG PO TABS: 10.0000 mg | ORAL_TABLET | Freq: Every day | ORAL | 5 refills | Status: DC

## 2017-04-12 NOTE — Progress Notes (Signed)
Subjective:    Patient ID: Shannon Small, female    DOB: May 01, 1960, 57 y.o.   MRN: [id number]  Shannon Small is a 57 y.o. female presenting on 04/12/2017 for Annual Exam and Hypertension   HPI   Here for Annual Physical Exam and DUE for labs today fasting. Also to discuss Hypertension.  CHRONIC HTN: Reports no longer taking anti-HTN medications, she stopped these due to not thinking she needed them, says she can feel when she has high BP and will get headache or pressure and has felt better, she checks BP outside office but does not have readings, says it is higher here usually Current Meds - HCTZ 25mg  daily, Lisinopril 10mg  daily- - Admits problem with inc urination on thiazide med, has frequent urination   Reports good compliance, did not take meds today.  MORBID OBESITY BMI >56 / Osteoarthritis multiple joints / plantar fasciitis Reports no new concerns, she is frustrated with weight and eager to lose weight. She has not made significant lifestyle changes, limited by weight and pain in joints cannot exercise as much by her report. She has tried to improve diet. - She is asking for a "diet pill" today, wants to take something like "phentermine", she is not aware of the cardiovascular HTN risks on this med as much, and is now more concerned - She is not ready for bariatric surgery but states it would likely not be covered for her and does not want surgery at this time - Request refill on Ibuprofen PRN, understands that she can take Tylenol PRN as well  Additional complaints - Reports prior history of recurrent fungal skin infection in groin and area of skin fold, request refill on Lotrisone cream PRN use, and also has recent vaginal yeast infection symptoms with discharge, has had issues with recurrence on this, request repeat Diflucan only take if need has not used in while  Health Maintenance:  Breast CA Screening: Due for mammogram screening. Last mammogram result 01/2015 done at [facility] by report. No prior history abnormal mammogram. Known family history of breast cancer, paternal Aunt. Currently asymptomatic. Request mammogram order today   Depression screen [facility] 2/9 04/12/2017 03/01/2016  Decreased Interest 0 0  Down, Depressed, Hopeless 0 0  PHQ - 2 Score 0 0    Past Medical History:  Diagnosis Date  . Acid reflux 09/09/2014  . Adjustment disorder with depressed mood 11/14/2006   ASSESSMENT: Bereavement. Recommend counseling with pastor or psychologist.   . Benign neoplasm of skin 11/14/2006  . Cephalalgia 02/10/2006  . Chronic infection of sinus 09/09/2014  . Extreme obesity 02/10/2006  . LBP (low back pain) 01/15/2007   Chronic back strain and spasms due to obesity.   . Sleep related leg cramps 09/09/2014   Past Surgical History:  Procedure Laterality Date  . ABDOMINAL HYSTERECTOMY    . RHINOPLASTY  1982   Social History   Socioeconomic History  . Marital status: Married    Spouse name: Not on file  . Number of children: Not on file  . Years of education: Not on file  . Highest education level: Not on file  Occupational History  . Not on file  Social Needs  . Financial resource strain: Not on file  . Food insecurity:    Worry: Not on file    Inability: Not on file  . Transportation needs:    Medical: Not on file    Non-medical: Not on file  Tobacco Use  .  Smoking status: Never Smoker  . Smokeless tobacco: Never Used  Substance and Sexual Activity  . Alcohol use: Yes    Alcohol/week: 0.0 oz    Comment: rarely  . Drug use: No  . Sexual activity: Not on file  Lifestyle  . Physical activity:    Days per week: Not on file    Minutes per session: Not on file  . Stress: Not on file  Relationships  . Social connections:    Talks on phone: Not on file    Gets together: Not on file    Attends religious service: Not on file    Active member of club or organization: Not on file    Attends meetings of clubs or organizations: Not on file     Relationship status: Not on file  . Intimate partner violence:    Fear of current or ex partner: Not on file    Emotionally abused: Not on file    Physically abused: Not on file    Forced sexual activity: Not on file  Other Topics Concern  . Not on file  Social History Narrative  . Not on file   Family History  Problem Relation Age of Onset  . Stomach cancer Father   . Kidney cancer Father   . Diabetes Maternal Grandmother   . Stroke Maternal Grandmother   . Stroke Maternal Grandfather   . Heart disease Paternal Grandmother   . Stroke Paternal Grandfather   . Diabetes Mother   . Alcohol abuse Brother   . Breast cancer Paternal Aunt   . Lung cancer Paternal Aunt   . Bladder Cancer Neg Hx   . Prostate cancer Neg Hx    Current Outpatient Medications on File Prior to Visit  Medication Sig  . albuterol (PROVENTIL HFA;VENTOLIN HFA) 108 (90 Base) MCG/ACT inhaler Inhale 2 puffs into the lungs every 4 (four) hours as needed for wheezing or shortness of breath (cough).   No current facility-administered medications on file prior to visit.     Review of Systems  Constitutional: Negative for activity change, appetite change, chills, diaphoresis, fatigue, fever and unexpected weight change.  HENT: Negative for congestion, hearing loss and sinus pressure.   Eyes: Negative for visual disturbance.  Respiratory: Negative for apnea, cough, choking, chest tightness, shortness of breath and wheezing.   Cardiovascular: Positive for leg swelling. Negative for chest pain and palpitations.  Gastrointestinal: Negative for abdominal pain, anal bleeding, blood in stool, constipation, diarrhea, nausea and vomiting.  Endocrine: Negative for cold intolerance.  Genitourinary: Positive for difficulty urinating, frequency and urgency. Negative for decreased urine volume, dysuria, hematuria, vaginal bleeding, vaginal discharge and vaginal pain.  Musculoskeletal: Negative for arthralgias and neck pain.    Skin: Negative for rash.  Allergic/Immunologic: Positive for environmental allergies.  Neurological: Negative for dizziness, weakness, light-headedness, numbness and headaches.  Hematological: Negative for adenopathy.  Psychiatric/Behavioral: Negative for behavioral problems, dysphoric mood and sleep disturbance. The patient is not nervous/anxious.    Per HPI unless specifically indicated above     Objective:    BP (!) 177/73   Pulse 80   Temp 97.8 F (36.6 C) (Oral)   Resp 16   Ht 5\' 5"  (1.651 m)   Wt (!) 339 lb (153.8 kg)   BMI 56.41 kg/m   Wt Readings from Last 3 Encounters:  04/12/17 (!) 339 lb (153.8 kg)  10/04/16 (!) 345 lb (156.5 kg)  09/14/16 (!) 342 lb 9.6 oz (155.4 kg)    Physical Exam  Constitutional: She is oriented to person, place, and time. She appears well-developed and well-nourished. No distress.  Well-appearing, comfortable, cooperative, morbidly obese  HENT:  Head: Normocephalic and atraumatic.  Mouth/Throat: Oropharynx is clear and moist.  Eyes: Pupils are equal, round, and reactive to light. Conjunctivae and EOM are normal. Right eye exhibits no discharge. Left eye exhibits no discharge.  Neck: Normal range of motion. Neck supple. No thyromegaly present.  Cardiovascular: Normal rate, regular rhythm, normal heart sounds and intact distal pulses.  No murmur heard. Pulmonary/Chest: Effort normal and breath sounds normal. No respiratory distress. She has no wheezes. She has no rales.  Abdominal: Soft. Bowel sounds are normal. She exhibits no distension and no mass. There is no tenderness.  Musculoskeletal: Normal range of motion. She exhibits no edema (Trace non pitting edema lower extremities) or tenderness.  Upper / Lower Extremities: - Normal muscle tone, strength bilateral upper extremities 5/5, lower extremities 5/5  Bulky appearance of knees due to large body habitus, some soft tissue edema L knee  Lymphadenopathy:    She has no cervical  adenopathy.  Neurological: She is alert and oriented to person, place, and time.  Distal sensation intact to light touch all extremities  Skin: Skin is warm and dry. No rash noted. She is not diaphoretic. No erythema.  Psychiatric: She has a normal mood and affect. Her behavior is normal.  Well groomed, good eye contact, normal speech and thoughts  Nursing note and vitals reviewed.  Results for orders placed or performed in visit on 03/28/16  Comprehensive Metabolic Panel (CMET)  Result Value Ref Range   Sodium 141 135 - 146 mmol/L   Potassium 4.0 3.5 - 5.3 mmol/L   Chloride 106 98 - 110 mmol/L   CO2 25 20 - 31 mmol/L   Glucose, Bld 93 65 - 99 mg/dL   BUN 16 7 - 25 mg/dL   Creat 0.64 0.50 - 1.05 mg/dL   Total Bilirubin 0.5 0.2 - 1.2 mg/dL   Alkaline Phosphatase 84 33 - 130 U/L   AST 15 10 - 35 U/L   ALT 16 6 - 29 U/L   Total Protein 6.7 6.1 - 8.1 g/dL   Albumin 4.0 3.6 - 5.1 g/dL   Calcium 9.1 8.6 - 10.4 mg/dL  Lipid Profile  Result Value Ref Range   Cholesterol 159 <200 mg/dL   Triglycerides 87 <150 mg/dL   HDL 63 >50 mg/dL   Total CHOL/HDL Ratio 2.5 <5.0 Ratio   VLDL 17 <30 mg/dL   LDL Cholesterol 79 <100 mg/dL  HgB A1c  Result Value Ref Range   Hgb A1c MFr Bld 5.1 <5.7 %   Mean Plasma Glucose 100 mg/dL      Assessment & Plan:   Problem List Items Addressed This Visit    Hypertension    Remains poorly controlled chronically, largely attributed to morbid obesity wt and attributes some med nonadherence to side effect on diuretic frequent urination  Plan: 1. Discontinued HCTZ 25mg  by her preference - switched to new rx Amlodipine 10mg  daily (advised caution with swelling, she is hesitant but willing to try), if get swelling symptom will switch back to thiazide and or increase Lisinopril - Continue Lisinopril 10mg  daily - Refilled meds (called pharm to notice NOT to fill HCTZ ) 2. Check outside BP readings, bring log to next visit 3. Counseling on low salt healthier  diet, low carb/calorie for wt loss, limited exercise due to foot / back pain 4. Follow-up 3 mo BP  re-check      Relevant Medications   lisinopril (PRINIVIL,ZESTRIL) 10 MG tablet   amLODipine (NORVASC) 10 MG tablet   Other Relevant Orders   CBC with Differential/Platelet   COMPLETE METABOLIC PANEL WITH GFR   Low back pain    Refill med, stable currently w/o flare      Relevant Medications   orphenadrine (NORFLEX) 100 MG tablet   ibuprofen (ADVIL,MOTRIN) 800 MG tablet   Morbid obesity with BMI of 50.0-59.9, adult (HCC)    Persistent chronic problem, weight recently actually lower 6 lbs Concern for etiology of multiple co-morbidities - Pre-DM (A1c 5.1 well controlled, not diabetic), uncontrolled HTN, chronic joint and back pain, R plantar fasciitis, limiting her mobility - Declined referral to [facility] lifestyle center  Plan: 1. Discussion on options again - caution with phentermine and diet medication d/t cardiovascular risk - advised that I do not rx this and use only with caution regarding other sources of this med, only able to take if closely monitored at a program specific for this. - Handout given - recommend next step in addition to improve lifestyle mostly diet since limited exercise, will offer refer to [facility] (Dr Leafy Ro) also can consider local [facility] for Obesity Clinic use of medications, also will need GYN to establish - Reviewed some future meds that are limited coverage, GLP1 and Contrave if need, also may refer to Bariatric surgery in future, previous concern was cost, she is considering Gastric Sleeve       Relevant Orders   Hemoglobin A1c   COMPLETE METABOLIC PANEL WITH GFR   Lipid panel   TSH   T4, free    Other Visit Diagnoses    Annual physical exam    -  Primary Review update health maintenance Order labs today for annual Encourage lifestyle modification see A&P    Relevant Orders   Hemoglobin A1c   CBC with  Differential/Platelet   COMPLETE METABOLIC PANEL WITH GFR   Lipid panel   Screening for breast cancer       Relevant Orders   MM DIGITAL SCREENING BILATERAL   Vaginitis and vulvovaginitis       Relevant Medications   clotrimazole-betamethasone (LOTRISONE) cream   fluconazole (DIFLUCAN) 150 MG tablet   Eczema, unspecified type       Relevant Medications   hydrocortisone 2.5 % cream      Meds ordered this encounter  Medications  . clotrimazole-betamethasone (LOTRISONE) cream    Sig: APPLY TOPICALLY TO GENITAL AREA FOR VAGINAL ITCHING AND IRRITATION FOR UP TO 2 WEEKS, THEN STOP    Dispense:  45 g    Refill:  1    Please consider 90 day supplies to promote better adherence  . fluconazole (DIFLUCAN) 150 MG tablet    Sig: Take one tablet by mouth on Day 1. Repeat dose 2nd tablet on Day 3.    Dispense:  2 tablet    Refill:  2  . DISCONTD: hydrochlorothiazide (HYDRODIURIL) 25 MG tablet    Sig: Take 1 tablet (25 mg total) by mouth daily.    Dispense:  90 tablet    Refill:  3  . lisinopril (PRINIVIL,ZESTRIL) 10 MG tablet    Sig: Take 1 tablet (10 mg total) by mouth daily.    Dispense:  90 tablet    Refill:  3  . orphenadrine (NORFLEX) 100 MG tablet    Sig: TAKE 1 TABLET BY MOUTH TWICE DAILY AS NEEDED FOR MUSCLE SPASMS  Dispense:  180 tablet    Refill:  2  . hydrocortisone 2.5 % cream    Sig: Apply topically 2 (two) times daily as needed. Eczema    Dispense:  30 g    Refill:  3  . ibuprofen (ADVIL,MOTRIN) 800 MG tablet    Sig: Take 1 tablet (800 mg total) by mouth every 8 (eight) hours as needed.    Dispense:  90 tablet    Refill:  1  . amLODipine (NORVASC) 10 MG tablet    Sig: Take 1 tablet (10 mg total) by mouth daily.    Dispense:  30 tablet    Refill:  5    Discontinued Hydrochlorothiazide and switched to Amlodipine    Follow up plan: Return in about 3 months (around 07/12/2017) for Weight check, HTN.  [name], [facility] 04/12/2017, 1:18 PM

## 2017-04-12 NOTE — Assessment & Plan Note (Signed)
Persistent chronic problem, weight recently actually lower 6 lbs Concern for etiology of multiple co-morbidities - Pre-DM (A1c 5.1 well controlled, not diabetic), uncontrolled HTN, chronic joint and back pain, R plantar fasciitis, limiting her mobility - Declined referral to [facility] Nutrition lifestyle center  Plan: 1. Discussion on options again - caution with phentermine and diet medication d/t cardiovascular risk - advised that I do not rx this and use only with caution regarding other sources of this med, only able to take if closely monitored at a program specific for this. - Handout given - recommend next step in addition to improve lifestyle mostly diet since limited exercise, will offer refer to [facility] (Dr Shannon Small) also can consider local [facility] for Obesity Clinic use of medications, also will need GYN to establish - Reviewed some future meds that are limited coverage, GLP1 and Contrave if need, also may refer to Bariatric surgery in future, previous concern was cost, she is considering Gastric Sleeve

## 2017-04-12 NOTE — Assessment & Plan Note (Signed)
Remains poorly controlled chronically, largely attributed to morbid obesity wt and attributes some med nonadherence to side effect on diuretic frequent urination  Plan: 1. Discontinued HCTZ 25mg  by her preference - switched to new rx Amlodipine 10mg  daily (advised caution with swelling, she is hesitant but willing to try), if get swelling symptom will switch back to thiazide and or increase Lisinopril - Continue Lisinopril 10mg  daily - Refilled meds (called pharm to notice NOT to fill HCTZ ) 2. Check outside BP readings, bring log to next visit 3. Counseling on low salt healthier diet, low carb/calorie for wt loss, limited exercise due to foot / back pain 4. Follow-up 3 mo BP re-check

## 2017-04-12 NOTE — Patient Instructions (Addendum)
Thank you for coming to the office today.  Refilled meds  DISCONTINUE Hydrochlorothiazide - (increased urination)  START NEW Amlodipine 10mg  daily  Start back on blood pressure pills Keep checking pressure outside office  If BP is still elevated we may need to increase to 2 pills for 20mg  daily in future  Use ibuprofen ONLY AS NEEDED  Recommend to start taking Tylenol Extra Strength 500mg  tabs - take 1 to 2 tabs per dose (max 1000mg ) every 6-8 hours for pain (take regularly, don't skip a dose for next 7 days), max 24 hour daily dose is 6 tablets or 3000mg . In the future you can repeat the same everyday Tylenol course for 1-2 weeks at a time.    For Mammogram screening for breast cancer   Call the [facility] below anytime to schedule your own appointment now that order has been placed.  [facility] [place], [place] [address] Phone: [phone]    WEIGHT LOSS  Self Referral - (MEDICINE MANAGEMENT) [facility] [address] [place], [place] [address] Hours: 8am - 5pm Main: [phone]  Weight Loss Program (BMI >27, obesity w/o co-morbidities) - Will check thyroid studies - B12 injection - Phentermine  WEIGHT MANAGEMENT  Dr Dennard Nip - ([place])  [facility] [place], [place] [address] Ph: [phone]  ----------------------------------------------------------------------  Last seen by Dr Matilde Sprang in 2017 - previously prescribing Vesicare 5mg  daily for bladder function  [place] -[address] floor [place],  [place]  [address] Phone: [phone]  Labs today - stay tuned for results  Please schedule a Follow-up Appointment to: Return in about 3 months (around 07/12/2017) for Weight check, HTN.  If you have any other questions or concerns,  please feel free to call the office or send a message through [place]. You may also schedule an earlier appointment if necessary.  Additionally, you may be receiving a survey about your experience at our office within a few days to 1 week by e-mail or mail. We value your feedback.  [name], DO [place]

## 2017-04-12 NOTE — Assessment & Plan Note (Signed)
Refill med, stable currently w/o flare

## 2017-04-13 LAB — CBC WITH DIFFERENTIAL/PLATELET
BASOS PCT: 0.3 %
Basophils Absolute: 31 cells/uL (ref 0–200)
EOS PCT: 1.5 %
Eosinophils Absolute: 156 cells/uL (ref 15–500)
HEMATOCRIT: 39.6 % (ref 35.0–45.0)
Hemoglobin: 13.3 g/dL (ref 11.7–15.5)
LYMPHS ABS: 2985 {cells}/uL (ref 850–3900)
MCH: 28.1 pg (ref 27.0–33.0)
MCHC: 33.6 g/dL (ref 32.0–36.0)
MCV: 83.7 fL (ref 80.0–100.0)
MPV: 11.1 fL (ref 7.5–12.5)
Monocytes Relative: 5.3 %
Neutro Abs: 6677 cells/uL (ref 1500–7800)
Neutrophils Relative %: 64.2 %
PLATELETS: 269 10*3/uL (ref 140–400)
RBC: 4.73 10*6/uL (ref 3.80–5.10)
RDW: 12.8 % (ref 11.0–15.0)
TOTAL LYMPHOCYTE: 28.7 %
WBC: 10.4 10*3/uL (ref 3.8–10.8)
WBCMIX: 551 {cells}/uL (ref 200–950)

## 2017-04-13 LAB — COMPLETE METABOLIC PANEL WITH GFR
AG Ratio: 1.3 (calc) (ref 1.0–2.5)
ALT: 12 U/L (ref 6–29)
AST: 12 U/L (ref 10–35)
Albumin: 3.9 g/dL (ref 3.6–5.1)
Alkaline phosphatase (APISO): 92 U/L (ref 33–130)
BUN/Creatinine Ratio: 37 (calc) — ABNORMAL HIGH (ref 6–22)
BUN: 26 mg/dL — ABNORMAL HIGH (ref 7–25)
CALCIUM: 9.2 mg/dL (ref 8.6–10.4)
CO2: 26 mmol/L (ref 20–32)
CREATININE: 0.7 mg/dL (ref 0.50–1.05)
Chloride: 105 mmol/L (ref 98–110)
GFR, EST AFRICAN AMERICAN: 112 mL/min/{1.73_m2} (ref >=60)
GFR, EST NON AFRICAN AMERICAN: 97 mL/min/{1.73_m2} (ref >=60)
Globulin: 2.9 g/dL (calc) (ref 1.9–3.7)
Glucose, Bld: 93 mg/dL (ref 65–99)
Potassium: 4.3 mmol/L (ref 3.5–5.3)
Sodium: 139 mmol/L (ref 135–146)
TOTAL PROTEIN: 6.8 g/dL (ref 6.1–8.1)
Total Bilirubin: 0.4 mg/dL (ref 0.2–1.2)

## 2017-04-13 LAB — LIPID PANEL
CHOL/HDL RATIO: 3 (calc) (ref <5.0)
Cholesterol: 184 mg/dL (ref <200)
HDL: 61 mg/dL (ref >50)
LDL CHOLESTEROL (CALC): 105 mg/dL — AB
Non-HDL Cholesterol (Calc): 123 mg/dL (calc) (ref <130)
TRIGLYCERIDES: 90 mg/dL (ref <150)

## 2017-04-13 LAB — HEMOGLOBIN A1C
EAG (MMOL/L): 5.8 (calc)
Hgb A1c MFr Bld: 5.3 % of total Hgb (ref <5.7)
MEAN PLASMA GLUCOSE: 105 (calc)

## 2017-04-13 LAB — TSH: TSH: 1.23 m[IU]/L (ref 0.40–4.50)

## 2017-04-13 LAB — T4, FREE: Free T4: 0.9 ng/dL (ref 0.8–1.8)

## 2017-05-24 ENCOUNTER — Other Ambulatory Visit: Payer: Self-pay | Admitting: Family Medicine

## 2017-07-19 ENCOUNTER — Telehealth: Payer: Self-pay | Admitting: Family Medicine

## 2017-07-19 NOTE — Telephone Encounter (Signed)
We do not routinely prescribe antibiotics without evaluating patient first. She would need to schedule office visit for this problem.  Shannon Small, [facility] 07/19/2017, 6:26 PM

## 2017-07-19 NOTE — Telephone Encounter (Signed)
Pt  Wanted to know if  You would send a prescription in for   Amoxicillin because she was having ear pain. Pt call back # [phone]

## 2017-07-20 NOTE — Telephone Encounter (Signed)
patient informed

## 2017-07-20 NOTE — Telephone Encounter (Signed)
No VM set up.

## 2017-07-28 ENCOUNTER — Other Ambulatory Visit: Payer: Self-pay | Admitting: Family Medicine

## 2017-07-28 DIAGNOSIS — N760 Acute vaginitis: Secondary | ICD-10-CM

## 2017-07-28 DIAGNOSIS — I10 Essential (primary) hypertension: Secondary | ICD-10-CM

## 2017-08-01 ENCOUNTER — Ambulatory Visit: Payer: 59 | Admitting: Family Medicine

## 2017-08-22 ENCOUNTER — Encounter: Payer: Self-pay | Admitting: Family Medicine

## 2017-08-22 ENCOUNTER — Ambulatory Visit (INDEPENDENT_AMBULATORY_CARE_PROVIDER_SITE_OTHER): Payer: 59 | Admitting: Family Medicine

## 2017-08-22 VITALS — BP 156/59 | HR 86 | Temp 97.8°F | Resp 16 | Ht 65.0 in | Wt 342.0 lb

## 2017-08-22 DIAGNOSIS — B372 Candidiasis of skin and nail: Secondary | ICD-10-CM

## 2017-08-22 DIAGNOSIS — I10 Essential (primary) hypertension: Secondary | ICD-10-CM | POA: Diagnosis not present

## 2017-08-22 DIAGNOSIS — N760 Acute vaginitis: Secondary | ICD-10-CM | POA: Diagnosis not present

## 2017-08-22 DIAGNOSIS — J0110 Acute frontal sinusitis, unspecified: Secondary | ICD-10-CM | POA: Diagnosis not present

## 2017-08-22 MED ORDER — NYSTATIN 100000 UNIT/GM EX POWD: Freq: Three times a day (TID) | CUTANEOUS | 1 refills | Status: DC

## 2017-08-22 MED ORDER — LISINOPRIL 20 MG PO TABS: 20.0000 mg | ORAL_TABLET | Freq: Every day | ORAL | 3 refills | Status: DC

## 2017-08-22 MED ORDER — FLUTICASONE PROPIONATE 50 MCG/ACT NA SUSP
2.0000 | Freq: Every day | NASAL | 3 refills | Status: DC
Start: 2017-08-22 — End: 2018-07-24

## 2017-08-22 MED ORDER — AMOXICILLIN 500 MG PO CAPS: 500.0000 mg | ORAL_CAPSULE | Freq: Two times a day (BID) | ORAL | 0 refills | Status: DC

## 2017-08-22 MED ORDER — FLUCONAZOLE 150 MG PO TABS: 150.0000 mg | ORAL_TABLET | Freq: Once | ORAL | 0 refills | Status: AC

## 2017-08-22 NOTE — Patient Instructions (Addendum)
Thank you for coming to the office today.  BP is elevated - INCREASE Lisinopril to 20mg  daily - take both meds same time every day - need to adhere to medications  1. It sounds like you have a Sinusitis (Bacterial Infection) - this most likely started as an Upper Respiratory Virus that has settled into an infection. Allergies can also cause this. - Start Amoxicillin 1 pill twice daily (breakfast and dinner, with food and plenty of water) for 10 days, complete entire course, do not stop early even if feeling better - Start Loratadine (Claritin) 10mg  daily and Flonase 2 sprays in each nostril daily for next 4-6 weeks, then you may stop and use seasonally or as needed  - Recommend to try using Nasal Saline spray multiple times a day to help flush out congestion and clear sinuses - Improve hydration by drinking plenty of clear fluids (water, gatorade) to reduce secretions and thin congestion - Congestion draining down throat can cause irritation. May try warm herbal tea with honey, cough drops - Can take Tylenol or Ibuprofen as needed for fevers  May try OTC Mucinex (or may try Mucinex-DM for cough) up to 7-10 days then stop  ------------------  Use Nystatin anti fungal powder 3 times a day for 1-2 weeks starting now  Try to keep skin under breast dry - and after antibiotics - then you may use the Diflucan anti yeast pill for 1 week this will help both breast pelvic symptoms  If not improved can call   [facility] [place], [place] [address] Phone: [phone]   If you develop persistent fever >101F for at least 3 consecutive days, headaches with sinus pain or pressure or persistent earache, please schedule a follow-up evaluation within next few days to week.   Please schedule a Follow-up Appointment to: Return in about 3 months (around 11/22/2017) for HTN.  If you have any other questions or concerns, please feel free to call the office or send a message through  [place]. You may also schedule an earlier appointment if necessary.  Additionally, you may be receiving a survey about your experience at our office within a few days to 1 week by e-mail or mail. We value your feedback.  [name], DO [place]

## 2017-08-22 NOTE — Progress Notes (Signed)
Subjective:    Patient ID: Shannon Small, female    DOB: 12/13/1960, 57 y.o.   MRN: [id number]  Shannon Small is a 57 y.o. female presenting on 08/22/2017 for Hypertension   HPI   CHRONIC HTN: Prior history of not taking meds regularly. Last time started amlodipine and stopped HCTZ - due to inc urination but then she felt "weird" on amlodipine and discontinued this on her own and restarted HCTZ. 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 In past improve on clobetasol cream, now less effective, has improved on oral diflucan before, also has some yeast vaginitis discharge. Admits some discomfort over skin without drainage or ulceration  Sinusitis Left Ear Similar last episode 09/2016, in past she was treated with augmentin and then levaquin eventually got results with improvement, was on additional therapy as well. Reports onset 2-3 weeks ago, with worsening sinus symptoms, not taken any OTC meds yet. Has some sinus pain and pressure. Admits thicker congestion, cough Denies fevers or chills  Health Maintenance: Still due for mammogram, previously ordered.  Depression screen [facility] 2/9 08/22/2017 04/12/2017 03/01/2016  Decreased Interest 0 0 0  Down, Depressed, Hopeless 0 0 0  PHQ - 2 Score 0 0 0    Social History   Tobacco Use  . Smoking status: Never Smoker  . Smokeless tobacco: Never Used  Substance Use Topics  . Alcohol use: Yes    Alcohol/week: 0.0 standard drinks    Comment: rarely  . Drug use: No    Review of Systems Per HPI unless specifically indicated above     Objective:    BP (!) 156/59   Pulse 86   Temp 97.8 F (36.6 C) (Oral)   Resp 16   Ht 5\' 5"  (1.651 m)   Wt (!)  342 lb (155.1 kg)   BMI 56.91 kg/m   Wt Readings from Last 3 Encounters:  08/22/17 (!) 342 lb (155.1 kg)  04/12/17 (!) 339 lb (153.8 kg)  10/04/16 (!) 345 lb (156.5 kg)    Physical Exam  Constitutional: She is oriented to person, place, and time. She appears well-developed and well-nourished. No distress.  Well-appearing, comfortable, cooperative, morbidly obese  HENT:  Head: Normocephalic and atraumatic.  Mouth/Throat: Oropharynx is clear and moist.  Frontal / maxillary sinuses mild tender. Nares with some turbinate edema without purulence. Bilateral TMs clear without erythema, effusion or bulging. Oropharynx clear without erythema, exudates, edema or asymmetry.  Eyes: Conjunctivae are normal. Right eye exhibits no discharge. Left eye exhibits no discharge.  Neck: Normal range of motion. Neck supple.  Cardiovascular: Normal rate, regular rhythm, normal heart sounds and intact distal pulses.  No murmur heard. Pulmonary/Chest: Effort normal and breath sounds normal. No respiratory distress. She has no wheezes. She has no rales.  Reduced air movement due to body habitus  Musculoskeletal: Normal range of motion. She exhibits no edema.  Lymphadenopathy:    She has no cervical adenopathy.  Neurological: She is alert and oriented to person, place, and time.  Skin: Skin is warm and dry. Rash (consistent with candidal intertrigo under bilateral breasts with red macerated skin, she remained covered during exam only viewed lower skin on chest wall) noted. She is not diaphoretic.  There is erythema.  Psychiatric: She has a normal mood and affect. Her behavior is normal.  Well groomed, good eye contact, normal speech and thoughts  Nursing note and vitals reviewed.  Results for orders placed or performed in visit on 04/12/17  Hemoglobin A1c  Result Value Ref Range   Hgb A1c MFr Bld 5.3 <5.7 % of total Hgb   Mean Plasma Glucose 105 (calc)   eAG (mmol/L) 5.8 (calc)  CBC with Differential/Platelet   Result Value Ref Range   WBC 10.4 3.8 - 10.8 Thousand/uL   RBC 4.73 3.80 - 5.10 Million/uL   Hemoglobin 13.3 11.7 - 15.5 g/dL   HCT 39.6 35.0 - 45.0 %   MCV 83.7 80.0 - 100.0 fL   MCH 28.1 27.0 - 33.0 pg   MCHC 33.6 32.0 - 36.0 g/dL   RDW 12.8 11.0 - 15.0 %   Platelets 269 140 - 400 Thousand/uL   MPV 11.1 7.5 - 12.5 fL   Neutro Abs 6,677 1,500 - 7,800 cells/uL   Lymphs Abs 2,985 850 - 3,900 cells/uL   WBC mixed population 551 200 - 950 cells/uL   Eosinophils Absolute 156 15 - 500 cells/uL   Basophils Absolute 31 0 - 200 cells/uL   Neutrophils Relative % 64.2 %   Total Lymphocyte 28.7 %   Monocytes Relative 5.3 %   Eosinophils Relative 1.5 %   Basophils Relative 0.3 %  COMPLETE METABOLIC PANEL WITH GFR  Result Value Ref Range   Glucose, Bld 93 65 - 99 mg/dL   BUN 26 (H) 7 - 25 mg/dL   Creat 0.70 0.50 - 1.05 mg/dL   GFR, Est Non African American 97 > OR = 60 mL/min/1.98m2   GFR, Est African American 112 > OR = 60 mL/min/1.48m2   BUN/Creatinine Ratio 37 (H) 6 - 22 (calc)   Sodium 139 135 - 146 mmol/L   Potassium 4.3 3.5 - 5.3 mmol/L   Chloride 105 98 - 110 mmol/L   CO2 26 20 - 32 mmol/L   Calcium 9.2 8.6 - 10.4 mg/dL   Total Protein 6.8 6.1 - 8.1 g/dL   Albumin 3.9 3.6 - 5.1 g/dL   Globulin 2.9 1.9 - 3.7 g/dL (calc)   AG Ratio 1.3 1.0 - 2.5 (calc)   Total Bilirubin 0.4 0.2 - 1.2 mg/dL   Alkaline phosphatase (APISO) 92 33 - 130 U/L   AST 12 10 - 35 U/L   ALT 12 6 - 29 U/L  Lipid panel  Result Value Ref Range   Cholesterol 184 <200 mg/dL   HDL 61 >50 mg/dL   Triglycerides 90 <150 mg/dL   LDL Cholesterol (Calc) 105 (H) mg/dL (calc)   Total CHOL/HDL Ratio 3.0 <5.0 (calc)   Non-HDL Cholesterol (Calc) 123 <130 mg/dL (calc)  TSH  Result Value Ref Range   TSH 1.23 0.40 - 4.50 mIU/L  T4, free  Result Value Ref Range   Free T4 0.9 0.8 - 1.8 ng/dL      Assessment & Plan:   Problem List Items Addressed This Visit    Hypertension Moderately elevated initial BP, poor  med adherence in past, self change meds and skip doses. - Home BP readings none  OFF Amlodipine   Plan:  1. INCREASE Lisinopril double from 10 to 20mg  daily 2. Continue HCTZ 25mg  daily 2. Encourage improved lifestyle - low sodium diet, regular exercise 3. Start monitor BP outside office, bring readings to next visit, if persistently >140/90 or new symptoms notify office  sooner 4. Follow-up 3 months     Relevant Medications   lisinopril (PRINIVIL,ZESTRIL) 20 MG tablet    Other Visit Diagnoses    Subacute frontal sinusitis    -  Primary  Consistent with acute maxillary sinusitis, likely initially viral URI allergic rhinitis component with worsening concern for bacterial infection now second sickening.  Plan: 1. Start Amoxicillin 10 day course 2. Resume Loratadine (Claritin) 10mg  daily and repeat rx sent for Flonase 2 sprays in each nostril daily for next 4-6 weeks, then may stop and use seasonally or as needed 3. Supportive care with nasal saline OTC, hydration - May try OTC Mucinex (or may try Mucinex-DM for cough) up to 7-10 days then stop 4. Return criteria reviewed     Relevant Medications   fluticasone (FLONASE) 50 MCG/ACT nasal spray   amoxicillin (AMOXIL) 500 MG capsule   Vaginitis and vulvovaginitis       Candidal intertrigo      Subacute flare recurrence, bilateral under breast skin folds, moderate appearing candidal intertrigo characteristic on exam. Prior similar episodes years ago due to excessive sweating / moisture.  Plan: 1. Start Nystatin powder TID for 2-4 weeks - while on antibiotic for sinuses - then given rx Diflucan 150mg  daily x 1 week - also cover after antibiotics and for vaginal symptoms - May use topical steroid ointment - can send rx stronger topical steroid if need - Advised keep dry - May follow-up with [facility] if not improve    Relevant Medications   nystatin (MYCOSTATIN/NYSTOP) powder      Meds ordered this encounter  Medications  .  lisinopril (PRINIVIL,ZESTRIL) 20 MG tablet    Sig: Take 1 tablet (20 mg total) by mouth daily.    Dispense:  90 tablet    Refill:  3    Dose increase from 10mg  up to 20mg   . fluconazole (DIFLUCAN) 150 MG tablet    Sig: Take 1 tablet (150 mg total) by mouth once for 1 dose. Start after antibiotic. For 7 days    Dispense:  1 tablet    Refill:  0  . nystatin (MYCOSTATIN/NYSTOP) powder    Sig: Apply topically 3 (three) times daily. Under breasts for 1-2 weeks or until healed    Dispense:  45 g    Refill:  1  . fluticasone (FLONASE) 50 MCG/ACT nasal spray    Sig: Place 2 sprays into both nostrils daily. Use for 4-6 weeks then stop and use seasonally or as needed.    Dispense:  16 g    Refill:  3  . amoxicillin (AMOXIL) 500 MG capsule    Sig: Take 1 capsule (500 mg total) by mouth 2 (two) times daily. For 10 days    Dispense:  20 capsule    Refill:  0    Follow up plan: Return in about 3 months (around 11/22/2017) for HTN.  [name], [facility] 08/23/2017, 12:32 AM

## 2017-08-24 ENCOUNTER — Telehealth: Payer: Self-pay | Admitting: Family Medicine

## 2017-08-24 DIAGNOSIS — B372 Candidiasis of skin and nail: Secondary | ICD-10-CM

## 2017-08-24 MED ORDER — KETOCONAZOLE 2 % EX CREA: 1.0000 "application " | TOPICAL_CREAM | Freq: Every day | CUTANEOUS | 0 refills | Status: DC

## 2017-08-24 NOTE — Telephone Encounter (Signed)
Patient advised.

## 2017-08-24 NOTE — Telephone Encounter (Signed)
Pt said powder is not working.  She asked to have something else called in (generic).  Her call back number is [phone]

## 2017-08-24 NOTE — Telephone Encounter (Signed)
Please notify patient  She used to be on Clotrimazole-Betamethasone cream, said this was not working. I sent rx Nystatin powder when I saw her less than 48 hours ago on 08/22/17.  She also has Diflucan oral anti fungal to take after the antibiotics are finished. This may help.  In meantime, she may stop powder and start on new rx sent - Ketoconazole topical cream, use once daily for 2 weeks.  If still not improving she needs to call her Dermatologist.  [name], [facility] 08/24/2017, 3:19 PM

## 2017-08-31 ENCOUNTER — Telehealth: Payer: Self-pay | Admitting: Nurse Practitioner

## 2017-08-31 DIAGNOSIS — T3695XA Adverse effect of unspecified systemic antibiotic, initial encounter: Principal | ICD-10-CM

## 2017-08-31 DIAGNOSIS — B379 Candidiasis, unspecified: Secondary | ICD-10-CM

## 2017-08-31 MED ORDER — FLUCONAZOLE 150 MG PO TABS: ORAL_TABLET | ORAL | 0 refills | Status: DC

## 2017-08-31 NOTE — Telephone Encounter (Signed)
Pt asked to have 2 more yeast pills sent to [facility].  Her call back number is [phone]

## 2017-09-21 ENCOUNTER — Telehealth: Payer: Self-pay | Admitting: Family Medicine

## 2017-09-21 ENCOUNTER — Encounter: Payer: Self-pay | Admitting: Family Medicine

## 2017-09-21 DIAGNOSIS — M25561 Pain in right knee: Principal | ICD-10-CM

## 2017-09-21 DIAGNOSIS — G8929 Other chronic pain: Secondary | ICD-10-CM

## 2017-09-21 NOTE — Telephone Encounter (Signed)
Known morbid obesity, impacting her knees with pain, no new injury, agree for 2nd opinion for R knee pain, will defer X-ray here and defer to orthopedic evaluation.  [name], [facility] 09/21/2017, 1:53 PM

## 2017-09-21 NOTE — Telephone Encounter (Signed)
Pt asked for referral for knee pain.  Her call back number is [phone]

## 2017-09-21 NOTE — Telephone Encounter (Signed)
patient states need referral for R knee pain hurts with ROM.

## 2017-09-26 DIAGNOSIS — M179 Osteoarthritis of knee, unspecified: Secondary | ICD-10-CM | POA: Insufficient documentation

## 2017-09-26 DIAGNOSIS — M1710 Unilateral primary osteoarthritis, unspecified knee: Secondary | ICD-10-CM | POA: Insufficient documentation

## 2017-10-05 ENCOUNTER — Telehealth: Payer: Self-pay | Admitting: Family Medicine

## 2017-10-05 NOTE — Telephone Encounter (Signed)
Pt said she needs referral to neurologist [phone]

## 2017-10-06 NOTE — Telephone Encounter (Signed)
Called patient.  She requested a followup phone call on Monday.  She states "I'm in a place where I'm not supposed to be talking on the phone."

## 2017-10-06 NOTE — Telephone Encounter (Signed)
The pt called requesting a referral to Neurology, because of her pain in her bilateral knees. She states her knee are bone on bone and she is having a lot of pain in her legs at night. I recommended that the patient schedule an appointment with Dr. Parks Ranger and she hung up the phone.

## 2017-10-08 NOTE — Telephone Encounter (Signed)
Currently unavailable on PAL - checking inbox briefly for several updates.  Note - this patient called on 09/21/17 for same medical complaint and referral to orthopedic surgery was placed at this time. No new referrals should be needed.  Check status of Ortho referral and notify patient.  [name], [facility] 10/08/2017, 9:37 PM

## 2017-10-29 ENCOUNTER — Other Ambulatory Visit: Payer: Self-pay | Admitting: Family Medicine

## 2017-10-29 DIAGNOSIS — B372 Candidiasis of skin and nail: Secondary | ICD-10-CM

## 2017-10-29 DIAGNOSIS — N760 Acute vaginitis: Secondary | ICD-10-CM

## 2017-11-20 ENCOUNTER — Ambulatory Visit (INDEPENDENT_AMBULATORY_CARE_PROVIDER_SITE_OTHER): Payer: 59 | Admitting: Family Medicine

## 2017-11-20 ENCOUNTER — Encounter: Payer: Self-pay | Admitting: Family Medicine

## 2017-11-20 VITALS — BP 157/82 | HR 78 | Temp 98.0°F | Resp 16 | Ht 65.0 in | Wt 316.0 lb

## 2017-11-20 DIAGNOSIS — J0110 Acute frontal sinusitis, unspecified: Secondary | ICD-10-CM | POA: Diagnosis not present

## 2017-11-20 DIAGNOSIS — B372 Candidiasis of skin and nail: Secondary | ICD-10-CM | POA: Diagnosis not present

## 2017-11-20 DIAGNOSIS — Z6841 Body Mass Index (BMI) 40.0 and over, adult: Secondary | ICD-10-CM | POA: Diagnosis not present

## 2017-11-20 MED ORDER — AMOXICILLIN-POT CLAVULANATE 875-125 MG PO TABS: 1.0000 | ORAL_TABLET | Freq: Two times a day (BID) | ORAL | 0 refills | Status: DC

## 2017-11-20 MED ORDER — TERBINAFINE HCL 250 MG PO TABS: 250.0000 mg | ORAL_TABLET | Freq: Every day | ORAL | 0 refills | Status: DC

## 2017-11-20 MED ORDER — CLOTRIMAZOLE-BETAMETHASONE 1-0.05 % EX CREA: TOPICAL_CREAM | Freq: Every day | CUTANEOUS | 0 refills | Status: DC

## 2017-11-20 MED ORDER — PSEUDOEPH-BROMPHEN-DM 30-2-10 MG/5ML PO SYRP: 5.0000 mL | ORAL_SOLUTION | Freq: Four times a day (QID) | ORAL | 0 refills | Status: DC | PRN

## 2017-11-20 NOTE — Patient Instructions (Addendum)
Thank you for coming to the office today.  Treat sinus infection with Augmentin Use cough syrup as well May take Mucinex Continue OTC medications and fluids  For skin rash / fungal - Stop big bottle of cream, switch to the new one today, Lotrisone cream - steroid / anti fungal for under breast - Go ahead and take oral pill Terbinafine 250mg  daily for 2 weeks - Follow up with Dermatologist if not improved  Please schedule a Follow-up Appointment to: Return in about 1 week (around 11/27/2017), or if symptoms worsen or fail to improve, for Follow-up 1 week as need sinus.  If you have any other questions or concerns, please feel free to call the office or send a message through [place]. You may also schedule an earlier appointment if necessary.  Additionally, you may be receiving a survey about your experience at our office within a few days to 1 week by e-mail or mail. We value your feedback.  [name], DO [place]

## 2017-11-20 NOTE — Progress Notes (Signed)
Subjective:    Patient ID: Shannon Small, female    DOB: July 13, 1960, 57 y.o.   MRN: [id number]  Shannon Small is a 57 y.o. female presenting on 11/20/2017 for Nasal Congestion (greenish to brown mucus, cough onset 4 days rash spreading onset 3 days obtw patient is on keto diet)  Patient presents for a same day appointment.  HPI   SINUSITIS Reports symptoms started about 4-5 days ago with deeper sinus pain and pressure, with some thicker sinus congestion, drainage ranging from green to brown. Multiple sick contacts at home similar respiratory illness. No contact with flu - Tried an OTC cold / cough medicine, for decongestant and NSAID - Admits slight chill - Denies fevers, muscle aches, nausea vomiting, abdominal pain, diarrhea  INTERTRIGO RASH Reports has had chronic issue with rash tissue under breast R flank primarily now, and she has seen Dermatologist, they gave her jar of triamcinolone 0.1% PRN use with some relief, now she is asking because it did not get better, in past improved with Lotrisone cream. - Also she tries to avoid excess moisture in this area, has not tried using towel at night  Morbid Obesity Follow-up today with reported significant wt loss with keto diet and keto diet pills OTC, she has lost 30 lbs in 3 months, she states saw orthopedic recently and they would not be able to do surgery for knee unless she lost weight and it scared her and she does not want to end up unable to walk - Currently taking wt loss pill Keto diet pill and improving her lifestyle and diet - No new complaints or symptoms today  Health Maintenance: Due for Flu Shot, patient ill today and declines today despite counseling on benefits   Depression screen [facility] 2/9 08/22/2017 04/12/2017 03/01/2016  Decreased Interest 0 0 0  Down, Depressed, Hopeless 0 0 0  PHQ - 2 Score 0 0 0    Social History   Tobacco Use  . Smoking status: Never Smoker  . Smokeless tobacco: Never Used  Substance Use  Topics  . Alcohol use: Yes    Alcohol/week: 0.0 standard drinks    Comment: rarely  . Drug use: No    Review of Systems Per HPI unless specifically indicated above     Objective:    BP (!) 157/82   Pulse 78   Temp 98 F (36.7 C) (Oral)   Resp 16   Ht 5\' 5"  (1.651 m)   Wt (!) 316 lb (143.3 kg)   SpO2 99%   BMI 52.59 kg/m   Wt Readings from Last 3 Encounters:  11/20/17 (!) 316 lb (143.3 kg)  08/22/17 (!) 342 lb (155.1 kg)  04/12/17 (!) 339 lb (153.8 kg)    Physical Exam  Constitutional: She is oriented to person, place, and time. She appears well-developed and well-nourished. No distress.  Well-appearing, comfortable, cooperative, obesity with weight loss  HENT:  Head: Normocephalic and atraumatic.  Mouth/Throat: Oropharynx is clear and moist.  Frontal / maxillary sinuses mild tender. Nares with some turbinate edema without purulence. Bilateral TMs clear without erythema, effusion or bulging. Oropharynx clear without erythema, exudates, edema or asymmetry.  Eyes: Conjunctivae are normal. Right eye exhibits no discharge. Left eye exhibits no discharge.  Cardiovascular: Normal rate.  Pulmonary/Chest: Effort normal.  Musculoskeletal: She exhibits no edema.  Neurological: She is alert and oriented to person, place, and time.  Skin: Skin is warm and dry. Rash (R lateral flank under breast tissue in skin  fold with erythematous dry flaking rash, with powder currently in place) noted. She is not diaphoretic. No erythema.  Psychiatric: She has a normal mood and affect. Her behavior is normal.  Well groomed, good eye contact, normal speech and thoughts  Nursing note and vitals reviewed.  Results for orders placed or performed in visit on 04/12/17  Hemoglobin A1c  Result Value Ref Range   Hgb A1c MFr Bld 5.3 <5.7 % of total Hgb   Mean Plasma Glucose 105 (calc)   eAG (mmol/L) 5.8 (calc)  CBC with Differential/Platelet  Result Value Ref Range   WBC 10.4 3.8 - 10.8 Thousand/uL    RBC 4.73 3.80 - 5.10 Million/uL   Hemoglobin 13.3 11.7 - 15.5 g/dL   HCT 39.6 35.0 - 45.0 %   MCV 83.7 80.0 - 100.0 fL   MCH 28.1 27.0 - 33.0 pg   MCHC 33.6 32.0 - 36.0 g/dL   RDW 12.8 11.0 - 15.0 %   Platelets 269 140 - 400 Thousand/uL   MPV 11.1 7.5 - 12.5 fL   Neutro Abs 6,677 1,500 - 7,800 cells/uL   Lymphs Abs 2,985 850 - 3,900 cells/uL   WBC mixed population 551 200 - 950 cells/uL   Eosinophils Absolute 156 15 - 500 cells/uL   Basophils Absolute 31 0 - 200 cells/uL   Neutrophils Relative % 64.2 %   Total Lymphocyte 28.7 %   Monocytes Relative 5.3 %   Eosinophils Relative 1.5 %   Basophils Relative 0.3 %  COMPLETE METABOLIC PANEL WITH GFR  Result Value Ref Range   Glucose, Bld 93 65 - 99 mg/dL   BUN 26 (H) 7 - 25 mg/dL   Creat 0.70 0.50 - 1.05 mg/dL   GFR, Est Non African American 97 > OR = 60 mL/min/1.2m2   GFR, Est African American 112 > OR = 60 mL/min/1.65m2   BUN/Creatinine Ratio 37 (H) 6 - 22 (calc)   Sodium 139 135 - 146 mmol/L   Potassium 4.3 3.5 - 5.3 mmol/L   Chloride 105 98 - 110 mmol/L   CO2 26 20 - 32 mmol/L   Calcium 9.2 8.6 - 10.4 mg/dL   Total Protein 6.8 6.1 - 8.1 g/dL   Albumin 3.9 3.6 - 5.1 g/dL   Globulin 2.9 1.9 - 3.7 g/dL (calc)   AG Ratio 1.3 1.0 - 2.5 (calc)   Total Bilirubin 0.4 0.2 - 1.2 mg/dL   Alkaline phosphatase (APISO) 92 33 - 130 U/L   AST 12 10 - 35 U/L   ALT 12 6 - 29 U/L  Lipid panel  Result Value Ref Range   Cholesterol 184 <200 mg/dL   HDL 61 >50 mg/dL   Triglycerides 90 <150 mg/dL   LDL Cholesterol (Calc) 105 (H) mg/dL (calc)   Total CHOL/HDL Ratio 3.0 <5.0 (calc)   Non-HDL Cholesterol (Calc) 123 <130 mg/dL (calc)  TSH  Result Value Ref Range   TSH 1.23 0.40 - 4.50 mIU/L  T4, free  Result Value Ref Range   Free T4 0.9 0.8 - 1.8 ng/dL      Assessment & Plan:   Problem List Items Addressed This Visit    Morbid obesity with BMI of 50.0-59.9, adult ([place]) Encourage continue improving lifestyle diet wt loss May continue  Keto diet as she is, reviewed keto diet pill, advised that these kind of supplement / OTC med options for wt loss are not ideal long term solution. Agree that if currently feels good on medicine, wt loss is  stable, and BP is reasonable range controlled can continue for now.     Other Visit Diagnoses    Acute non-recurrent frontal sinusitis    -  Primary  Consistent with acute frontal sinusitis. Duration near 1 week now with concern for signs of possible bacterial sinus with thicker purulent drainage. In past has required antibiotics for deeper sinus infection  Plan: 1. Start Augmentin 875-125mg  PO BID x 10 days 2. May use anti histamine PRN / Nasal spray 3. Cough syrup, non codeine, sent to pharmacy 4. Return criteria reviewed     Relevant Medications   terbinafine (LAMISIL) 250 MG tablet   amoxicillin-clavulanate (AUGMENTIN) 875-125 MG tablet   brompheniramine-pseudoephedrine-DM 30-2-10 MG/5ML syrup   Intertriginous candidiasis      Given extensive area will try oral anti fungal therapy with Terbinafine given some extension onto arms appears possible more tinea - 2 weeks on oral - Also refill Lotrisone topical cream antifungal/steroid, hold Triamcinolone for now - If still not improving can return to dermatology - Avoid excess moisture    Relevant Medications   terbinafine (LAMISIL) 250 MG tablet   clotrimazole-betamethasone (LOTRISONE) cream      Meds ordered this encounter  Medications  . terbinafine (LAMISIL) 250 MG tablet    Sig: Take 1 tablet (250 mg total) by mouth daily. For 2 weeks    Dispense:  14 tablet    Refill:  0  . clotrimazole-betamethasone (LOTRISONE) cream    Sig: Apply topically daily.    Dispense:  45 g    Refill:  0  . amoxicillin-clavulanate (AUGMENTIN) 875-125 MG tablet    Sig: Take 1 tablet by mouth 2 (two) times daily.    Dispense:  20 tablet    Refill:  0  . brompheniramine-pseudoephedrine-DM 30-2-10 MG/5ML syrup    Sig: Take 5 mLs by mouth 4  (four) times daily as needed.    Dispense:  118 mL    Refill:  0      Follow up plan: Return in about 1 week (around 11/27/2017), or if symptoms worsen or fail to improve, for Follow-up 1 week as need sinus.  [name], DO [facility] 11/20/2017, 12:46 PM

## 2017-11-22 ENCOUNTER — Ambulatory Visit: Payer: 59 | Admitting: Family Medicine

## 2017-12-13 ENCOUNTER — Telehealth: Payer: Self-pay | Admitting: Family Medicine

## 2017-12-13 NOTE — Telephone Encounter (Signed)
Pt called requesting refill on amoxicillin. Pt call back # is  [phone]

## 2017-12-13 NOTE — Telephone Encounter (Signed)
I would not recommend refill on antibiotics - it has been 3 weeks since 11/20/17 Augmentin was rx for sinusitis. She should follow-up in office or at urgent care - we may need to refer her to ENT next option.  [name], [facility] 12/13/2017, 6:32 PM

## 2017-12-14 NOTE — Telephone Encounter (Signed)
Advised patient as per Dr. Raliegh Ip doesn't prefer any other option like Urgent care, ENT or Evisit. She will wait until Monday to see if symptoms will resolved.

## 2017-12-15 ENCOUNTER — Ambulatory Visit: Payer: 59 | Admitting: Family Medicine

## 2017-12-18 ENCOUNTER — Ambulatory Visit (INDEPENDENT_AMBULATORY_CARE_PROVIDER_SITE_OTHER): Payer: 59 | Admitting: Family Medicine

## 2017-12-18 ENCOUNTER — Encounter: Payer: Self-pay | Admitting: Family Medicine

## 2017-12-18 VITALS — BP 149/78 | HR 77 | Temp 97.5°F | Resp 16 | Ht 65.0 in | Wt 306.0 lb

## 2017-12-18 DIAGNOSIS — J0110 Acute frontal sinusitis, unspecified: Secondary | ICD-10-CM | POA: Diagnosis not present

## 2017-12-18 DIAGNOSIS — J0101 Acute recurrent maxillary sinusitis: Secondary | ICD-10-CM | POA: Diagnosis not present

## 2017-12-18 MED ORDER — LEVOFLOXACIN 500 MG PO TABS: 500.0000 mg | ORAL_TABLET | Freq: Every day | ORAL | 0 refills | Status: DC

## 2017-12-18 MED ORDER — PSEUDOEPH-BROMPHEN-DM 30-2-10 MG/5ML PO SYRP: 5.0000 mL | ORAL_SOLUTION | Freq: Four times a day (QID) | ORAL | 0 refills | Status: DC | PRN

## 2017-12-18 NOTE — Progress Notes (Signed)
Subjective:    Patient ID: Shannon Small, female    DOB: Jan 07, 1961, 57 y.o.   MRN: [id number]  Shannon Small is a 57 y.o. female presenting on 12/18/2017 for Sinus Problem (ear pain from sinus onset week)   HPI  Subacute Sinusitis, Recurrent - Last visit with me for same issue sinusitis 11/20/17, treated with Augmentin, see prior notes for background information. - Interval update with now recurrence, did not get 100% improved - Today patient reports still has sinus pain and pressure and purulence, with congestion - using cough syrup but has some drainage causing cough - Admits some R ear pain from sinus - Denies fever chills sweats or body aches, dyspnea   Depression screen [facility] 2/9 12/18/2017 08/22/2017 04/12/2017  Decreased Interest 0 0 0  Down, Depressed, Hopeless 0 0 0  PHQ - 2 Score 0 0 0    Social History   Tobacco Use  . Smoking status: Never Smoker  . Smokeless tobacco: Never Used  Substance Use Topics  . Alcohol use: Yes    Alcohol/week: 0.0 standard drinks    Comment: rarely  . Drug use: No    Review of Systems Per HPI unless specifically indicated above     Objective:    BP (!) 149/78   Pulse 77   Temp (!) 97.5 F (36.4 C) (Oral)   Resp 16   Ht 5\' 5"  (1.651 m)   Wt (!) 306 lb (138.8 kg)   SpO2 97%   BMI 50.92 kg/m   Wt Readings from Last 3 Encounters:  12/18/17 (!) 306 lb (138.8 kg)  11/20/17 (!) 316 lb (143.3 kg)  08/22/17 (!) 342 lb (155.1 kg)    Physical Exam  Constitutional: She is oriented to person, place, and time. She appears well-developed and well-nourished. No distress.  Well-appearing, comfortable, cooperative, obesity with weight loss  HENT:  Head: Normocephalic and atraumatic.  Mouth/Throat: Oropharynx is clear and moist.  L>R maxillary sinuses tender. Nares with still some turbinate edema with congestion without purulence. Bilateral TMs clear without erythema, effusion or bulging. Oropharynx clear without erythema, exudates, edema  or asymmetry.  Eyes: Conjunctivae are normal. Right eye exhibits no discharge. Left eye exhibits no discharge.  Cardiovascular: Normal rate.  Pulmonary/Chest: Effort normal.  Musculoskeletal: She exhibits no edema.  Neurological: She is alert and oriented to person, place, and time.  Skin: Skin is warm and dry. She is not diaphoretic. No erythema.  Psychiatric: She has a normal mood and affect. Her behavior is normal.  Well groomed, good eye contact, normal speech and thoughts  Nursing note and vitals reviewed.      Assessment & Plan:   Problem List Items Addressed This Visit    None    Visit Diagnoses    Acute recurrent maxillary sinusitis    -  Primary   Relevant Medications   levofloxacin (LEVAQUIN) 500 MG tablet   brompheniramine-pseudoephedrine-DM 30-2-10 MG/5ML syrup   Acute non-recurrent frontal sinusitis       Relevant Medications   levofloxacin (LEVAQUIN) 500 MG tablet   brompheniramine-pseudoephedrine-DM 30-2-10 MG/5ML syrup      Consistent with subacute vs recurrent maxillary sinusitis - last treated 11/2017 with Augmentin did not quite resolve 100%. Now concern for recurrent or second sickening, has history of difficult to treat sinusitis in past  Plan: 1. Start taking Levaquin antibiotic 500mg  daily x 7-10 days 2. Refill cough syrup bromphed - change if needed stronger 3. Continue Flonase, supportive sinus medicines 4.  Return criteria reviewed   Meds ordered this encounter  Medications  . levofloxacin (LEVAQUIN) 500 MG tablet    Sig: Take 1 tablet (500 mg total) by mouth daily. For 10 days    Dispense:  10 tablet    Refill:  0  . brompheniramine-pseudoephedrine-DM 30-2-10 MG/5ML syrup    Sig: Take 5 mLs by mouth 4 (four) times daily as needed.    Dispense:  118 mL    Refill:  0    Follow up plan: Return in about 2 weeks (around 01/01/2018), or if symptoms worsen or fail to improve, for sinusitis.  [name], DO [facility] 12/18/2017, 11:22 AM

## 2017-12-18 NOTE — Patient Instructions (Addendum)
Thank you for coming to the office today.  Treat sinus infection with Levaquin now - may be recurrent Use cough syrup as well May take Mucinex Continue OTC medications and fluids  Please schedule a Follow-up Appointment to: Return in about 2 weeks (around 01/01/2018), or if symptoms worsen or fail to improve, for sinusitis.  If you have any other questions or concerns, please feel free to call the office or send a message through [facility] [place]. You may also schedule an earlier appointment if necessary.  Additionally, you may be receiving a survey about your experience at our office within a few days to 1 week by e-mail or mail. We value your feedback.  [name], DO [place]

## 2018-01-06 ENCOUNTER — Other Ambulatory Visit: Payer: Self-pay | Admitting: Family Medicine

## 2018-01-06 DIAGNOSIS — B372 Candidiasis of skin and nail: Secondary | ICD-10-CM

## 2018-01-29 ENCOUNTER — Encounter: Payer: 59 | Admitting: Family Medicine

## 2018-02-05 ENCOUNTER — Other Ambulatory Visit: Payer: Self-pay | Admitting: Family Medicine

## 2018-02-05 DIAGNOSIS — B372 Candidiasis of skin and nail: Secondary | ICD-10-CM

## 2018-02-05 DIAGNOSIS — L309 Dermatitis, unspecified: Secondary | ICD-10-CM

## 2018-02-05 NOTE — Telephone Encounter (Signed)
Can you call patient to clarify which cream she still uses and which one she needs?  Thanks  [name], [facility] 02/05/2018, 11:49 AM

## 2018-02-07 ENCOUNTER — Encounter: Payer: Self-pay | Admitting: Family Medicine

## 2018-02-07 ENCOUNTER — Ambulatory Visit (INDEPENDENT_AMBULATORY_CARE_PROVIDER_SITE_OTHER): Payer: 59 | Admitting: Family Medicine

## 2018-02-07 VITALS — BP 134/70 | HR 83 | Temp 97.8°F | Resp 16 | Ht 65.0 in | Wt 295.0 lb

## 2018-02-07 DIAGNOSIS — I10 Essential (primary) hypertension: Secondary | ICD-10-CM

## 2018-02-07 DIAGNOSIS — R7303 Prediabetes: Secondary | ICD-10-CM

## 2018-02-07 DIAGNOSIS — Z1239 Encounter for other screening for malignant neoplasm of breast: Secondary | ICD-10-CM | POA: Diagnosis not present

## 2018-02-07 DIAGNOSIS — Z0000 Encounter for general adult medical examination without abnormal findings: Secondary | ICD-10-CM

## 2018-02-07 DIAGNOSIS — E559 Vitamin D deficiency, unspecified: Secondary | ICD-10-CM

## 2018-02-07 DIAGNOSIS — E538 Deficiency of other specified B group vitamins: Secondary | ICD-10-CM

## 2018-02-07 DIAGNOSIS — Z6841 Body Mass Index (BMI) 40.0 and over, adult: Secondary | ICD-10-CM

## 2018-02-07 DIAGNOSIS — J3089 Other allergic rhinitis: Secondary | ICD-10-CM

## 2018-02-07 DIAGNOSIS — E7800 Pure hypercholesterolemia, unspecified: Secondary | ICD-10-CM

## 2018-02-07 NOTE — Patient Instructions (Addendum)
Thank you for coming to the office today.  Stay tuned for lab results - will call next week with results.  Keep up the good work with weight loss and lifestyle.  For Mammogram screening for breast cancer   Call the [facility] below anytime to schedule your own appointment now that order has been placed.  [facility] [place], [place] [address] Phone: [phone]   Please schedule a Follow-up Appointment to: Return in about 6 months (around 08/08/2018) for 6 month follow-up Weight, HTN.  If you have any other questions or concerns, please feel free to call the office or send a message through [place]. You may also schedule an earlier appointment if necessary.  Additionally, you may be receiving a survey about your experience at our office within a few days to 1 week by e-mail or mail. We value your feedback.  [name], DO [place]

## 2018-02-07 NOTE — Progress Notes (Signed)
Subjective:    Patient ID: Shannon Small, female    DOB: 1960-09-11, 58 y.o.   MRN: [id number]  Shannon Small is a 58 y.o. female presenting on 02/07/2018 for Annual Exam   HPI   Here for Annual Physical and Ordered fasting labs today  Morbid Obesity BMI >49 - Down 21 lbs since 11/2017, on keto diet pill medication - Improving diet, gradually increasing activity walking  CHRONIC HTN: Doing well Current Meds -HCTZ 25mg  daily, Lisinopril 10mg  daily Taking meds  Health Maintenance:  Breast CA Screening: Due for mammogram screening. Last mammogram result >2 years ago, done at [facility]. No prior history abnormal mammogram known family history of breast cancer, paternal aunt breast cancer. Currently asymptomatic.  Decline pap smear  Depression screen [facility] 2/9 02/07/2018 12/18/2017 08/22/2017  Decreased Interest 0 0 0  Down, Depressed, Hopeless 0 0 0  PHQ - 2 Score 0 0 0    Past Medical History:  Diagnosis Date  . Acid reflux 09/09/2014  . Adjustment disorder with depressed mood 11/14/2006   ASSESSMENT: Bereavement. Recommend counseling with pastor or psychologist.   . Benign neoplasm of skin 11/14/2006  . Cephalalgia 02/10/2006  . Chronic infection of sinus 09/09/2014  . Extreme obesity 02/10/2006  . LBP (low back pain) 01/15/2007   Chronic back strain and spasms due to obesity.   . Sleep related leg cramps 09/09/2014   Past Surgical History:  Procedure Laterality Date  . ABDOMINAL HYSTERECTOMY    . RHINOPLASTY  1982   Social History   Socioeconomic History  . Marital status: Married    Spouse name: Not on file  . Number of children: Not on file  . Years of education: Not on file  . Highest education level: Not on file  Occupational History  . Not on file  Social Needs  . Financial resource strain: Not on file  . Food insecurity:    Worry: Not on file    Inability: Not on file  . Transportation needs:    Medical: Not on file    Non-medical: Not on file  Tobacco  Use  . Smoking status: Never Smoker  . Smokeless tobacco: Never Used  Substance and Sexual Activity  . Alcohol use: Yes    Alcohol/week: 0.0 standard drinks    Comment: rarely  . Drug use: No  . Sexual activity: Not on file  Lifestyle  . Physical activity:    Days per week: Not on file    Minutes per session: Not on file  . Stress: Not on file  Relationships  . Social connections:    Talks on phone: Not on file    Gets together: Not on file    Attends religious service: Not on file    Active member of club or organization: Not on file    Attends meetings of clubs or organizations: Not on file    Relationship status: Not on file  . Intimate partner violence:    Fear of current or ex partner: Not on file    Emotionally abused: Not on file    Physically abused: Not on file    Forced sexual activity: Not on file  Other Topics Concern  . Not on file  Social History Narrative  . Not on file   Family History  Problem Relation Age of Onset  . Stomach cancer Father   . Kidney cancer Father   . Diabetes Maternal Grandmother   . Stroke Maternal Grandmother   .  Stroke Maternal Grandfather   . Heart disease Paternal Grandmother   . Stroke Paternal Grandfather   . Diabetes Mother   . Alcohol abuse Brother   . Breast cancer Paternal Aunt   . Lung cancer Paternal Aunt   . Bladder Cancer Neg Hx   . Prostate cancer Neg Hx    Current Outpatient Medications on File Prior to Visit  Medication Sig  . albuterol (PROVENTIL HFA;VENTOLIN HFA) 108 (90 Base) MCG/ACT inhaler Inhale 2 puffs into the lungs every 4 (four) hours as needed for wheezing or shortness of breath (cough).  . fluticasone (FLONASE) 50 MCG/ACT nasal spray Place 2 sprays into both nostrils daily. Use for 4-6 weeks then stop and use seasonally or as needed.  . hydrochlorothiazide (HYDRODIURIL) 25 MG tablet TAKE 1 TABLET BY MOUTH ONCE DAILY  . ibuprofen (ADVIL,MOTRIN) 800 MG tablet TAKE 1 TABLET BY MOUTH THREE TIMES DAILY    . lisinopril (PRINIVIL,ZESTRIL) 20 MG tablet Take 1 tablet (20 mg total) by mouth daily.  Mar[place] Kitchen nystatin (MYCOSTATIN/NYSTOP) powder Apply topically 3 (three) times daily. Under breasts for 1-2 weeks or until healed  . orphenadrine (NORFLEX) 100 MG tablet TAKE 1 TABLET BY MOUTH TWICE DAILY AS NEEDED FOR MUSCLE SPASM  . terbinafine (LAMISIL) 250 MG tablet Take 1 tablet (250 mg total) by mouth daily. For 2 weeks  . triamcinolone cream (KENALOG) 0.1 % APPLY TWICE DAILY UNTIL CLEAR  . clotrimazole-betamethasone (LOTRISONE) cream APPLY TOPICALLY TWICE DAILY FOR UP TO 2 WEEKS  . hydrocortisone 2.5 % cream APPLY  CREAM TO AFFECTED AREA TWICE DAILY AS NEEDED ECZEMA  . ketoconazole (NIZORAL) 2 % cream APPLY TOPICALLY DAILY AS NEEDED FOR IRRITATION   No current facility-administered medications on file prior to visit.     Review of Systems  Constitutional: Negative for activity change, appetite change, chills, diaphoresis, fatigue and fever.  HENT: Positive for congestion and sinus pressure. Negative for hearing loss and sinus pain.   Eyes: Negative for visual disturbance.  Respiratory: Negative for cough, chest tightness, shortness of breath and wheezing.   Cardiovascular: Negative for chest pain, palpitations and leg swelling.  Gastrointestinal: Negative for abdominal pain, anal bleeding, blood in stool, constipation, diarrhea, nausea and vomiting.  Endocrine: Negative for cold intolerance.  Genitourinary: Negative for dysuria, frequency and hematuria.  Musculoskeletal: Negative for arthralgias, back pain and neck pain.  Skin: Negative for rash.  Allergic/Immunologic: Negative for environmental allergies.  Neurological: Negative for dizziness, weakness, light-headedness, numbness and headaches.  Hematological: Negative for adenopathy.  Psychiatric/Behavioral: Negative for behavioral problems, dysphoric mood and sleep disturbance. The patient is not nervous/anxious.    Per HPI unless specifically  indicated above      Objective:    BP 134/70 (BP Location: Left Arm, Cuff Size: Normal)   Pulse 83   Temp 97.8 F (36.6 C) (Oral)   Resp 16   Ht 5\' 5"  (1.651 m)   Wt 295 lb (133.8 kg)   BMI 49.09 kg/m   Wt Readings from Last 3 Encounters:  02/07/18 295 lb (133.8 kg)  12/18/17 (!) 306 lb (138.8 kg)  11/20/17 (!) 316 lb (143.3 kg)    Physical Exam Vitals signs and nursing note reviewed.  Constitutional:      General: She is not in acute distress.    Appearance: She is well-developed. She is not diaphoretic.     Comments: Well-appearing, comfortable, cooperative, morbidly obese  HENT:     Head: Normocephalic and atraumatic.  Eyes:     General:  Right eye: No discharge.        Left eye: No discharge.     Conjunctiva/sclera: Conjunctivae normal.     Pupils: Pupils are equal, round, and reactive to light.  Neck:     Musculoskeletal: Normal range of motion and neck supple.     Thyroid: No thyromegaly.  Cardiovascular:     Rate and Rhythm: Normal rate and regular rhythm.     Heart sounds: Normal heart sounds. No murmur.  Pulmonary:     Effort: Pulmonary effort is normal. No respiratory distress.     Breath sounds: Normal breath sounds. No wheezing or rales.  Abdominal:     General: Bowel sounds are normal. There is no distension.     Palpations: Abdomen is soft. There is no mass.     Tenderness: There is no abdominal tenderness.  Musculoskeletal: Normal range of motion.        General: No tenderness.     Comments: Upper / Lower Extremities: - Normal muscle tone, strength bilateral upper extremities 5/5, lower extremities 5/5  Lymphadenopathy:     Cervical: No cervical adenopathy.  Skin:    General: Skin is warm and dry.     Findings: No erythema or rash.  Neurological:     Mental Status: She is alert and oriented to person, place, and time.     Comments: Distal sensation intact to light touch all extremities  Psychiatric:        Behavior: Behavior normal.      Comments: Well groomed, good eye contact, normal speech and thoughts    Results for orders placed or performed in visit on 04/12/17  Hemoglobin A1c  Result Value Ref Range   Hgb A1c MFr Bld 5.3 <5.7 % of total Hgb   Mean Plasma Glucose 105 (calc)   eAG (mmol/L) 5.8 (calc)  CBC with Differential/Platelet  Result Value Ref Range   WBC 10.4 3.8 - 10.8 Thousand/uL   RBC 4.73 3.80 - 5.10 Million/uL   Hemoglobin 13.3 11.7 - 15.5 g/dL   HCT 39.6 35.0 - 45.0 %   MCV 83.7 80.0 - 100.0 fL   MCH 28.1 27.0 - 33.0 pg   MCHC 33.6 32.0 - 36.0 g/dL   RDW 12.8 11.0 - 15.0 %   Platelets 269 140 - 400 Thousand/uL   MPV 11.1 7.5 - 12.5 fL   Neutro Abs 6,677 1,500 - 7,800 cells/uL   Lymphs Abs 2,985 850 - 3,900 cells/uL   WBC mixed population 551 200 - 950 cells/uL   Eosinophils Absolute 156 15 - 500 cells/uL   Basophils Absolute 31 0 - 200 cells/uL   Neutrophils Relative % 64.2 %   Total Lymphocyte 28.7 %   Monocytes Relative 5.3 %   Eosinophils Relative 1.5 %   Basophils Relative 0.3 %  COMPLETE METABOLIC PANEL WITH GFR  Result Value Ref Range   Glucose, Bld 93 65 - 99 mg/dL   BUN 26 (H) 7 - 25 mg/dL   Creat 0.70 0.50 - 1.05 mg/dL   GFR, Est Non African American 97 > OR = 60 mL/min/1.65m2   GFR, Est African American 112 > OR = 60 mL/min/1.20m2   BUN/Creatinine Ratio 37 (H) 6 - 22 (calc)   Sodium 139 135 - 146 mmol/L   Potassium 4.3 3.5 - 5.3 mmol/L   Chloride 105 98 - 110 mmol/L   CO2 26 20 - 32 mmol/L   Calcium 9.2 8.6 - 10.4 mg/dL   Total Protein 6.8 6.1 -  8.1 g/dL   Albumin 3.9 3.6 - 5.1 g/dL   Globulin 2.9 1.9 - 3.7 g/dL (calc)   AG Ratio 1.3 1.0 - 2.5 (calc)   Total Bilirubin 0.4 0.2 - 1.2 mg/dL   Alkaline phosphatase (APISO) 92 33 - 130 U/L   AST 12 10 - 35 U/L   ALT 12 6 - 29 U/L  Lipid panel  Result Value Ref Range   Cholesterol 184 <200 mg/dL   HDL 61 >50 mg/dL   Triglycerides 90 <150 mg/dL   LDL Cholesterol (Calc) 105 (H) mg/dL (calc)   Total CHOL/HDL Ratio 3.0 <5.0  (calc)   Non-HDL Cholesterol (Calc) 123 <130 mg/dL (calc)  TSH  Result Value Ref Range   TSH 1.23 0.40 - 4.50 mIU/L  T4, free  Result Value Ref Range   Free T4 0.9 0.8 - 1.8 ng/dL      Assessment & Plan:   Problem List Items Addressed This Visit    Allergic rhinitis due to allergen   Hypertension Controlled on current regimen    Relevant Orders   CBC with Differential/Platelet   COMPLETE METABOLIC PANEL WITH GFR   Morbid obesity with BMI of 45.0-49.9, adult (HCC)   Relevant Orders   Lipid panel   TSH   Pre-diabetes Previously controlled Not on med Improve lifestyle Check A1c    Relevant Orders   Hemoglobin A1c    Other Visit Diagnoses    Annual physical exam    -  Primary   Relevant Orders   Hemoglobin A1c   CBC with Differential/Platelet   COMPLETE METABOLIC PANEL WITH GFR   Lipid panel   Screening for breast cancer       Relevant Orders   MM DIGITAL SCREENING BILATERAL   Hypercholesteremia       Relevant Orders   Lipid panel   TSH   Vitamin D deficiency       Relevant Orders   VITAMIN D 25 Hydroxy (Vit-D Deficiency, Fractures)   Vitamin B12 nutritional deficiency       Relevant Orders   Vitamin B12     Updated Health Maintenance information Labs ordered, drawn today, follow-up results Encouraged improvement to lifestyle with diet and exercise - Goal of weight loss    No orders of the defined types were placed in this encounter.   Follow up plan: Return in about 6 months (around 08/08/2018) for 6 month follow-up Weight, HTN.  [name], [facility] 02/07/2018, 9:27 AM

## 2018-02-07 NOTE — Telephone Encounter (Signed)
Discussed with patient. Refilled creams  [name], DO [facility] 02/07/2018, 9:16 AM

## 2018-02-08 ENCOUNTER — Other Ambulatory Visit: Payer: Self-pay | Admitting: Family Medicine

## 2018-02-08 DIAGNOSIS — E559 Vitamin D deficiency, unspecified: Secondary | ICD-10-CM

## 2018-02-08 LAB — CBC WITH DIFFERENTIAL/PLATELET
Absolute Monocytes: 536 cells/uL (ref 200–950)
Basophils Absolute: 53 cells/uL (ref 0–200)
Basophils Relative: 0.5 %
Eosinophils Absolute: 273 cells/uL (ref 15–500)
Eosinophils Relative: 2.6 %
HCT: 39.3 % (ref 35.0–45.0)
HEMOGLOBIN: 13.6 g/dL (ref 11.7–15.5)
LYMPHS ABS: 2541 {cells}/uL (ref 850–3900)
MCH: 29.6 pg (ref 27.0–33.0)
MCHC: 34.6 g/dL (ref 32.0–36.0)
MCV: 85.4 fL (ref 80.0–100.0)
MPV: 10.7 fL (ref 7.5–12.5)
Monocytes Relative: 5.1 %
Neutro Abs: 7098 cells/uL (ref 1500–7800)
Neutrophils Relative %: 67.6 %
Platelets: 271 10*3/uL (ref 140–400)
RBC: 4.6 10*6/uL (ref 3.80–5.10)
RDW: 13 % (ref 11.0–15.0)
Total Lymphocyte: 24.2 %
WBC: 10.5 10*3/uL (ref 3.8–10.8)

## 2018-02-08 LAB — COMPLETE METABOLIC PANEL WITH GFR
AG Ratio: 1.4 (calc) (ref 1.0–2.5)
ALT: 12 U/L (ref 6–29)
AST: 11 U/L (ref 10–35)
Albumin: 4 g/dL (ref 3.6–5.1)
Alkaline phosphatase (APISO): 85 U/L (ref 33–130)
BUN/Creatinine Ratio: 46 (calc) — ABNORMAL HIGH (ref 6–22)
BUN: 27 mg/dL — ABNORMAL HIGH (ref 7–25)
CO2: 28 mmol/L (ref 20–32)
Calcium: 9.4 mg/dL (ref 8.6–10.4)
Chloride: 106 mmol/L (ref 98–110)
Creat: 0.59 mg/dL (ref 0.50–1.05)
GFR, EST AFRICAN AMERICAN: 119 mL/min/{1.73_m2} (ref >=60)
GFR, EST NON AFRICAN AMERICAN: 102 mL/min/{1.73_m2} (ref >=60)
Globulin: 2.8 g/dL (calc) (ref 1.9–3.7)
Glucose, Bld: 94 mg/dL (ref 65–99)
Potassium: 4.4 mmol/L (ref 3.5–5.3)
Sodium: 143 mmol/L (ref 135–146)
TOTAL PROTEIN: 6.8 g/dL (ref 6.1–8.1)
Total Bilirubin: 0.4 mg/dL (ref 0.2–1.2)

## 2018-02-08 LAB — LIPID PANEL
Cholesterol: 187 mg/dL (ref <200)
HDL: 68 mg/dL (ref >50)
LDL Cholesterol (Calc): 104 mg/dL (calc) — ABNORMAL HIGH
Non-HDL Cholesterol (Calc): 119 mg/dL (calc) (ref <130)
Total CHOL/HDL Ratio: 2.8 (calc) (ref <5.0)
Triglycerides: 63 mg/dL (ref <150)

## 2018-02-08 LAB — HEMOGLOBIN A1C
Hgb A1c MFr Bld: 5.2 % of total Hgb (ref <5.7)
Mean Plasma Glucose: 103 (calc)
eAG (mmol/L): 5.7 (calc)

## 2018-02-08 LAB — VITAMIN D 25 HYDROXY (VIT D DEFICIENCY, FRACTURES): VIT D 25 HYDROXY: 17 ng/mL — AB (ref 30–100)

## 2018-02-08 LAB — VITAMIN B12: Vitamin B-12: 440 pg/mL (ref 200–1100)

## 2018-02-08 LAB — TSH: TSH: 0.71 mIU/L (ref 0.40–4.50)

## 2018-02-08 MED ORDER — VITAMIN D (ERGOCALCIFEROL) 1.25 MG (50000 UNIT) PO CAPS: 50000.0000 [IU] | ORAL_CAPSULE | ORAL | 0 refills | Status: DC

## 2018-02-15 ENCOUNTER — Encounter: Payer: Self-pay | Admitting: Family Medicine

## 2018-02-15 ENCOUNTER — Ambulatory Visit (INDEPENDENT_AMBULATORY_CARE_PROVIDER_SITE_OTHER): Payer: 59 | Admitting: Family Medicine

## 2018-02-15 VITALS — BP 158/85 | HR 76 | Temp 97.8°F | Resp 16 | Ht 65.0 in | Wt 296.4 lb

## 2018-02-15 DIAGNOSIS — J0100 Acute maxillary sinusitis, unspecified: Secondary | ICD-10-CM | POA: Diagnosis not present

## 2018-02-15 MED ORDER — IPRATROPIUM BROMIDE 0.06 % NA SOLN: 2.0000 | Freq: Four times a day (QID) | NASAL | 0 refills | Status: DC

## 2018-02-15 MED ORDER — PSEUDOEPH-BROMPHEN-DM 30-2-10 MG/5ML PO SYRP: 5.0000 mL | ORAL_SOLUTION | Freq: Four times a day (QID) | ORAL | 0 refills | Status: DC | PRN

## 2018-02-15 MED ORDER — LEVOFLOXACIN 500 MG PO TABS: 500.0000 mg | ORAL_TABLET | Freq: Every day | ORAL | 0 refills | Status: DC

## 2018-02-15 NOTE — Progress Notes (Signed)
Subjective:    Patient ID: Shannon Small, female    DOB: May 07, 1960, 58 y.o.   MRN: [id number]  Shannon Small is a 58 y.o. female presenting on 02/15/2018 for Cough (greenish brown mucus onset week and half also SOB)  Patient presents for a same day appointment.  HPI   Acute Recurrent Sinusitis Reports symptoms onset >1.5 weeks now, she noticed significant worsening in past few days, previously was more cough congestion symptoms only mild, now worse with thicker congestion drainage greener and brown - Using OTC cold medicine decongestant - Last sinusitis - similar course, onset 12/2017, treated with levaquin and failed augmentin, eventually resolved - She has seen [facility], they recommended CT Sinus scan due to concern of chronic recurrent pansinusitis, however patient declined CT due to cost. - Admits some cough productive at time with occasional dyspnea during cough - Denies any fever, chills, sweats, chest pain or pressure  Depression screen [facility] 2/9 02/15/2018 02/07/2018 12/18/2017  Decreased Interest 0 0 0  Down, Depressed, Hopeless 0 0 0  PHQ - 2 Score 0 0 0    Social History   Tobacco Use  . Smoking status: Never Smoker  . Smokeless tobacco: Never Used  Substance Use Topics  . Alcohol use: Yes    Alcohol/week: 0.0 standard drinks    Comment: rarely  . Drug use: No    Review of Systems Per HPI unless specifically indicated above     Objective:    BP (!) 158/85   Pulse 76   Temp 97.8 F (36.6 C) (Oral)   Resp 16   Ht 5\' 5"  (1.651 m)   Wt 296 lb 6.4 oz (134.4 kg)   SpO2 97%   BMI 49.32 kg/m   Wt Readings from Last 3 Encounters:  02/15/18 296 lb 6.4 oz (134.4 kg)  02/07/18 295 lb (133.8 kg)  12/18/17 (!) 306 lb (138.8 kg)    Physical Exam Vitals signs and nursing note reviewed.  Constitutional:      General: She is not in acute distress.    Appearance: She is well-developed. She is not diaphoretic.     Comments: Well-appearing, comfortable, cooperative,  obese  HENT:     Head: Normocephalic and atraumatic.     Comments: Frontal / maxillary sinuses mild tender. Nares turbinate edema and congestion without purulence. Bilateral TMs clear without erythema, effusion or bulging. Oropharynx with mild post nasal drainage without erythema, exudates, edema or asymmetry. Eyes:     General:        Right eye: No discharge.        Left eye: No discharge.     Conjunctiva/sclera: Conjunctivae normal.  Neck:     Musculoskeletal: Normal range of motion and neck supple.     Thyroid: No thyromegaly.  Cardiovascular:     Rate and Rhythm: Normal rate and regular rhythm.     Heart sounds: Normal heart sounds. No murmur.  Pulmonary:     Effort: Pulmonary effort is normal. No respiratory distress.     Breath sounds: Normal breath sounds. No wheezing or rales.     Comments: Good air movement. Occasional coughing. Musculoskeletal: Normal range of motion.  Lymphadenopathy:     Cervical: No cervical adenopathy.  Skin:    General: Skin is warm and dry.     Findings: No erythema or rash.  Neurological:     Mental Status: She is alert and oriented to person, place, and time.  Psychiatric:  Behavior: Behavior normal.     Comments: Well groomed, good eye contact, normal speech and thoughts        Assessment & Plan:   Problem List Items Addressed This Visit    None    Visit Diagnoses    Acute non-recurrent maxillary sinusitis    -  Primary   Relevant Medications   ipratropium (ATROVENT) 0.06 % nasal spray   levofloxacin (LEVAQUIN) 500 MG tablet   brompheniramine-pseudoephedrine-DM 30-2-10 MG/5ML syrup      Consistent with acute frontal vs maxillary sinusitis - question if recurrent, seems about 1.5 months after last episode, seems to have frequent sinusitis Likely initially viral URI vs allergic rhinitis component with worsening concern for bacterial infection now with second sickening again  Plan: 1. Start taking Levaquin antibiotic 500mg   daily x 7 days 2. Start Atrovent nasal spray decongestant 2 sprays in each nostril up to 4 times daily for 7 days 3. Start Bromfed DM cough syrup 4. Add mucinex 5. Return criteria reviewed  Work note given   Meds ordered this encounter  Medications  . ipratropium (ATROVENT) 0.06 % nasal spray    Sig: Place 2 sprays into both nostrils 4 (four) times daily. For up to 5-7 days then stop.    Dispense:  15 mL    Refill:  0  . levofloxacin (LEVAQUIN) 500 MG tablet    Sig: Take 1 tablet (500 mg total) by mouth daily. For 7 days    Dispense:  7 tablet    Refill:  0  . brompheniramine-pseudoephedrine-DM 30-2-10 MG/5ML syrup    Sig: Take 5 mLs by mouth 4 (four) times daily as needed.    Dispense:  118 mL    Refill:  0    Follow up plan: Return in about 1 week (around 02/22/2018), or if symptoms worsen or fail to improve, for sinusitis.   [name], [facility] 02/15/2018, 9:43 AM

## 2018-02-15 NOTE — Patient Instructions (Addendum)
Thank you for coming to the office today.  1. It sounds like you have a Sinusitis (Bacterial Infection) - this most likely started as an Upper Respiratory Virus that has settled into an infection. Allergies can also cause this.  Start taking Levaquin antibiotic 500mg  daily x 7 days  Start Atrovent nasal spray decongestant 2 sprays in each nostril up to 4 times daily for 7 days  Start cough syrup as needed  May try OTC Mucinex up to 7-10 days then stop  - Recommend to keep using Nasal Saline spray multiple times a day to help flush out congestion and clear sinuses - Improve hydration by drinking plenty of clear fluids (water, gatorade) to reduce secretions and thin congestion - Congestion draining down throat can cause irritation. May try warm herbal tea with honey, cough drops - Can take Tylenol or Ibuprofen as needed for fevers - May continue over the counter cold medicine as you are, I would not use any decongestant or mucinex longer than 7 days.  If you develop persistent fever >101F for at least 3 consecutive days, headaches with sinus pain or pressure or persistent earache, please schedule a follow-up evaluation within next few days to week.   Please schedule a Follow-up Appointment to: Return in about 1 week (around 02/22/2018), or if symptoms worsen or fail to improve, for sinusitis.  If you have any other questions or concerns, please feel free to call the office or send a message through [place]. You may also schedule an earlier appointment if necessary.  Additionally, you may be receiving a survey about your experience at our office within a few days to 1 week by e-mail or mail. We value your feedback.  [name], DO [place]

## 2018-03-05 ENCOUNTER — Other Ambulatory Visit: Payer: Self-pay | Admitting: Family Medicine

## 2018-03-05 DIAGNOSIS — B372 Candidiasis of skin and nail: Secondary | ICD-10-CM

## 2018-04-09 ENCOUNTER — Other Ambulatory Visit: Payer: Self-pay | Admitting: Family Medicine

## 2018-04-09 DIAGNOSIS — B372 Candidiasis of skin and nail: Secondary | ICD-10-CM

## 2018-04-09 MED ORDER — CLOTRIMAZOLE-BETAMETHASONE 1-0.05 % EX CREA: TOPICAL_CREAM | CUTANEOUS | 0 refills | Status: DC

## 2018-04-09 NOTE — Telephone Encounter (Signed)
Pt  Called requesting refill on  Medication for breast  Yeast infection ( cream , pill pt could not give me the name of the  Medication ) [place].

## 2018-04-10 ENCOUNTER — Ambulatory Visit (INDEPENDENT_AMBULATORY_CARE_PROVIDER_SITE_OTHER): Payer: 59 | Admitting: Family Medicine

## 2018-04-10 ENCOUNTER — Encounter: Payer: Self-pay | Admitting: Family Medicine

## 2018-04-10 ENCOUNTER — Other Ambulatory Visit: Payer: Self-pay

## 2018-04-10 DIAGNOSIS — R300 Dysuria: Secondary | ICD-10-CM | POA: Diagnosis not present

## 2018-04-10 DIAGNOSIS — M545 Low back pain, unspecified: Secondary | ICD-10-CM

## 2018-04-10 DIAGNOSIS — B9689 Other specified bacterial agents as the cause of diseases classified elsewhere: Secondary | ICD-10-CM

## 2018-04-10 DIAGNOSIS — N3000 Acute cystitis without hematuria: Secondary | ICD-10-CM | POA: Diagnosis not present

## 2018-04-10 DIAGNOSIS — B379 Candidiasis, unspecified: Secondary | ICD-10-CM | POA: Diagnosis not present

## 2018-04-10 DIAGNOSIS — T3695XA Adverse effect of unspecified systemic antibiotic, initial encounter: Secondary | ICD-10-CM

## 2018-04-10 LAB — POCT URINALYSIS DIPSTICK
Appearance: NORMAL
Bilirubin, UA: NEGATIVE
Glucose, UA: NEGATIVE
Ketones, UA: NEGATIVE
Leukocytes, UA: NEGATIVE
Nitrite, UA: NEGATIVE
Odor: NEGATIVE
Protein, UA: NEGATIVE
RBC UA: NEGATIVE
Spec Grav, UA: 1.015 (ref 1.010–1.025)
Urobilinogen, UA: 0.2 E.U./dL
pH, UA: 5.5 (ref 5.0–8.0)

## 2018-04-10 MED ORDER — FLUCONAZOLE 150 MG PO TABS: ORAL_TABLET | ORAL | 0 refills | Status: DC

## 2018-04-10 MED ORDER — CEPHALEXIN 500 MG PO CAPS: 500.0000 mg | ORAL_CAPSULE | Freq: Three times a day (TID) | ORAL | 0 refills | Status: DC

## 2018-04-10 NOTE — Progress Notes (Signed)
Virtual Visit via Telephone The purpose of this virtual visit is to provide medical care while limiting exposure to the novel coronavirus (COVID19) for both patient and office staff.  Consent was obtained for phone visit:  Yes.   Answered questions that patient had about telehealth interaction:  Yes.   I discussed the limitations, risks, security and privacy concerns of performing an evaluation and management service by telephone. I also discussed with the patient that there may be a patient responsible charge related to this service. The patient expressed understanding and agreed to proceed.  Patient Location: Home Provider Location: [facility] (Office)  ---------------------------------------------------------------------- CC: Dysuria / Low Back Pain radiating to R side low back  S: Reviewed CMA telephone note below. I have called patient and gathered additional HPI as follows:  Reports that symptoms started 2 days ago acute onset with low back pain, she has tried topical icy hot and ice packs / heating pad, seems to not be relieved, she thinks it is not back pain due to muscle since it is not improving. - Admits some dysuria with urination and frequency otherwise no difficulty urinating, similar to prior UTI, she also endorses yeast symptoms thinks she has more yeast infection, has some itching without discharge - Has yeast under breast, previously treated recently on topical cream - no prior history of kidney stone  Denies any high risk travel to areas of current concern for COVID19. Denies any known or suspected exposure to person with or possibly with COVID19.  Admits low grade subjective fever Denies any chills, sweats, body ache, cough, shortness of breath, sinus pain or pressure, headache, abdominal pain, diarrhea, hematuria  Past Medical History:  Diagnosis Date  . Acid reflux 09/09/2014  . Adjustment disorder with depressed mood 11/14/2006   ASSESSMENT:  Bereavement. Recommend counseling with pastor or psychologist.   . Benign neoplasm of skin 11/14/2006  . Cephalalgia 02/10/2006  . Chronic infection of sinus 09/09/2014  . Extreme obesity 02/10/2006  . LBP (low back pain) 01/15/2007   Chronic back strain and spasms due to obesity.   . Sleep related leg cramps 09/09/2014   Social History   Tobacco Use  . Smoking status: Never Smoker  . Smokeless tobacco: Never Used  Substance Use Topics  . Alcohol use: Yes    Alcohol/week: 0.0 standard drinks    Comment: rarely  . Drug use: No    Current Outpatient Medications:  .  albuterol (PROVENTIL HFA;VENTOLIN HFA) 108 (90 Base) MCG/ACT inhaler, Inhale 2 puffs into the lungs every 4 (four) hours as needed for wheezing or shortness of breath (cough)., Disp: 1 Inhaler, Rfl: 0 .  brompheniramine-pseudoephedrine-DM 30-2-10 MG/5ML syrup, Take 5 mLs by mouth 4 (four) times daily as needed., Disp: 118 mL, Rfl: 0 .  clotrimazole-betamethasone (LOTRISONE) cream, APPLY TOPICALLY TO GENITAL AREA FOR VAGINAL ITCHING AND IRRITATION FOR UP TO 2 WEEKS, THEN STOP., Disp: 45 g, Rfl: 0 .  fluticasone (FLONASE) 50 MCG/ACT nasal spray, Place 2 sprays into both nostrils daily. Use for 4-6 weeks then stop and use seasonally or as needed., Disp: 16 g, Rfl: 3 .  hydrochlorothiazide (HYDRODIURIL) 25 MG tablet, TAKE 1 TABLET BY MOUTH ONCE DAILY, Disp: 90 tablet, Rfl: 3 .  hydrocortisone 2.5 % cream, APPLY  CREAM TO AFFECTED AREA TWICE DAILY AS NEEDED ECZEMA, Disp: 28 g, Rfl: 2 .  ibuprofen (ADVIL,MOTRIN) 800 MG tablet, TAKE 1 TABLET BY MOUTH THREE TIMES DAILY, Disp: 90 tablet, Rfl: 1 .  ipratropium (ATROVENT) 0.06 %  nasal spray, Place 2 sprays into both nostrils 4 (four) times daily. For up to 5-7 days then stop., Disp: 15 mL, Rfl: 0 .  ketoconazole (NIZORAL) 2 % cream, APPLY TOPICALLY DAILY AS NEEDED FOR IRRITATION, Disp: 30 g, Rfl: 2 .  lisinopril (PRINIVIL,ZESTRIL) 20 MG tablet, Take 1 tablet (20 mg total) by mouth daily., Disp:  90 tablet, Rfl: 3 .  nystatin (MYCOSTATIN/NYSTOP) powder, Apply topically 3 (three) times daily. Under breasts for 1-2 weeks or until healed, Disp: 45 g, Rfl: 1 .  orphenadrine (NORFLEX) 100 MG tablet, TAKE 1 TABLET BY MOUTH TWICE DAILY AS NEEDED FOR MUSCLE SPASM, Disp: 180 tablet, Rfl: 1 .  terbinafine (LAMISIL) 250 MG tablet, Take 1 tablet (250 mg total) by mouth daily. For 2 weeks, Disp: 14 tablet, Rfl: 0 .  triamcinolone cream (KENALOG) 0.1 %, APPLY TWICE DAILY UNTIL CLEAR, Disp: , Rfl: 1 .  Vitamin D, Ergocalciferol, (DRISDOL) 1.25 MG (50000 UT) CAPS capsule, Take 1 capsule (50,000 Units total) by mouth once a week. For 12 weeks. Then start OTC Vitamin D3 2,000 unit daily., Disp: 12 capsule, Rfl: 0 .  cephALEXin (KEFLEX) 500 MG capsule, Take 1 capsule (500 mg total) by mouth 3 (three) times daily. For 7 days, Disp: 21 capsule, Rfl: 0 .  fluconazole (DIFLUCAN) 150 MG tablet, Take one tablet by mouth today. Then after antibiotics, may take 2nd pill, wait 24 hours, and then take 3rd pill to finish treatment., Disp: 3 tablet, Rfl: 0  Depression screen [facility] 2/9 04/10/2018 02/15/2018 02/07/2018  Decreased Interest 0 0 0  Down, Depressed, Hopeless 0 0 0  PHQ - 2 Score 0 0 0    No flowsheet data found.  -------------------------------------------------------------------------- O: No physical exam performed due to remote telephone encounter.  Results for orders placed or performed in visit on 04/10/18 (from the past 24 hour(s))  POCT urinalysis dipstick     Status: Normal   Collection Time: 04/10/18 11:47 AM  Result Value Ref Range   Color, UA amber    Clarity, UA clear    Glucose, UA Negative Negative   Bilirubin, UA neg    Ketones, UA neg    Spec Grav, UA 1.015 1.010 - 1.025   Blood, UA neg    pH, UA 5.5 5.0 - 8.0   Protein, UA Negative Negative   Urobilinogen, UA 0.2 0.2 or 1.0 E.U./dL   Nitrite, UA neg    Leukocytes, UA Negative Negative   Appearance normal    Odor neg       -------------------------------------------------------------------------- A&P:  Problem List Items Addressed This Visit    Low back pain   Relevant Orders   POCT urinalysis dipstick (Completed)   Urine Culture    Other Visit Diagnoses    Dysuria    -  Primary   Relevant Orders   POCT urinalysis dipstick (Completed)   Urine Culture   Acute cystitis without hematuria       Relevant Medications   cephALEXin (KEFLEX) 500 MG capsule   Antibiotic-induced yeast infection       Relevant Medications   cephALEXin (KEFLEX) 500 MG capsule   fluconazole (DIFLUCAN) 150 MG tablet     Clinically with UTI symptoms and low back pain, no prior nephrolithiasis, cannot rule out stone Seems to not respond to conservative muscle treatments at home, less likely MSK without acute injury, but note patient is morbidly obese can be cause contributing to her back pain  Similar to prior UTI vs yeast infection  Patient asked to come by office for urine sample, see results above for dipstick  Plan - Check urine dipstick UA - results negative for obvious infection - no blood as well less likely nephrolithiasis - Will go ahead and send urine culture out for result, pending - Empiric treatment Keflex 500mg  TID x 7 days sent - Empiric Diflucan 150mg  day 1 and then after antibiotics repeat dosage as advised due to yeast infection - May use existing conservative care for back pain, can consider repeat muscle relaxant if indicated - If not improving - advised next option would likely be Urgent Care vs ED for evaluation of possible stone may warrant further imaging back or CT stone study if significant back pain  Orders Placed This Encounter  Procedures  . Urine Culture  . POCT urinalysis dipstick    Meds ordered this encounter  Medications  . cephALEXin (KEFLEX) 500 MG capsule    Sig: Take 1 capsule (500 mg total) by mouth 3 (three) times daily. For 7 days    Dispense:  21 capsule    Refill:  0   . fluconazole (DIFLUCAN) 150 MG tablet    Sig: Take one tablet by mouth today. Then after antibiotics, may take 2nd pill, wait 24 hours, and then take 3rd pill to finish treatment.    Dispense:  3 tablet    Refill:  0    Follow-up: - Return in 1 week if not improved  Patient verbalizes understanding with the above medical recommendations including the limitation of remote medical advice.  Specific follow-up and call-back criteria were given for patient to follow-up or seek medical care more urgently if needed.  - Time spent in direct consultation with patient on phone: 7 minutes  [name], [facility] 04/10/2018, 10:43 AM

## 2018-04-10 NOTE — Patient Instructions (Addendum)
We will treat for a urinary tract infection and yeast infection first  Urine dipstick did not show obvious sign of bacteria - we will still send for culture  - Start Keflex 500mg  3 times daily for next 7 days, complete entire course, even if feeling better - We sent urine for a culture, we will call you within next few days if we need to change antibiotics - Please drink plenty of fluids, improve hydration over next 1 week  Also start Diflucan pill anti yeast medication today x 1 pill and then wait until finish antibiotic then can finish last 2 doses of yeast pill  If symptoms worsening, developing nausea / vomiting, worsening back pain, fevers / chills / sweats, then please contact us or notify - we would recommend Urgent Care or Hospital ED for further evaluation and management - may possibly have a kidney stone.  To prevent bladder and kidney infections...  Wipe front to back after using the restroom  Drink enough water to keep your pee clear to pale yellow  If you think you are getting another bladder infection, start drinking cranberry juice and come see [place] so we can check the urine.  If you have any other questions or concerns, please feel free to call the office or send a message through [place]. You may also schedule an earlier appointment if necessary.  Additionally, you may be receiving a survey about your experience at our office within a few days to 1 week by e-mail or mail. We value your feedback.  [name], DO [place]

## 2018-04-11 ENCOUNTER — Telehealth: Payer: Self-pay

## 2018-04-11 DIAGNOSIS — S39012A Strain of muscle, fascia and tendon of lower back, initial encounter: Secondary | ICD-10-CM

## 2018-04-11 DIAGNOSIS — M62830 Muscle spasm of back: Secondary | ICD-10-CM

## 2018-04-11 LAB — URINE CULTURE
MICRO NUMBER:: 367011
SPECIMEN QUALITY:: ADEQUATE

## 2018-04-11 MED ORDER — CYCLOBENZAPRINE HCL 10 MG PO TABS: 10.0000 mg | ORAL_TABLET | Freq: Three times a day (TID) | ORAL | 1 refills | Status: DC | PRN

## 2018-04-11 NOTE — Telephone Encounter (Signed)
Patient called back, she agree and was advised that we will go ahead and send rx Flexeril 5-10mg  TID PRN for back  [name], [facility] 04/11/2018, 1:59 PM

## 2018-04-11 NOTE — Telephone Encounter (Signed)
Patient called back and now thinks she has a pulled muscle in her back.  She now remembers that she was lifting 5lbs weights.  Please advise

## 2018-04-11 NOTE — Telephone Encounter (Signed)
See virtual visit telephone from 04/10/18 yesterday  Still waiting on urine culture.  She should continue her antibiotics for now until we determine cause of symptoms.  She used to be on Flexeril muscle relaxant in past, we could rx this again if she prefers, otherwise conservative care for back strain would involve heating pad, rest, avoid heavy lifting.   Back strain may take few days to weeks to resolve.  Let me know if she prefers Flexeril.  [name], [facility] 04/11/2018, 1:38 PM

## 2018-04-13 ENCOUNTER — Ambulatory Visit: Payer: 59 | Admitting: Family Medicine

## 2018-05-03 ENCOUNTER — Other Ambulatory Visit: Payer: Self-pay | Admitting: Family Medicine

## 2018-05-03 DIAGNOSIS — B372 Candidiasis of skin and nail: Secondary | ICD-10-CM

## 2018-06-05 ENCOUNTER — Other Ambulatory Visit: Payer: Self-pay | Admitting: Family Medicine

## 2018-07-17 ENCOUNTER — Encounter: Payer: Self-pay | Admitting: Family Medicine

## 2018-07-17 ENCOUNTER — Other Ambulatory Visit: Payer: Self-pay

## 2018-07-17 ENCOUNTER — Ambulatory Visit (INDEPENDENT_AMBULATORY_CARE_PROVIDER_SITE_OTHER): Payer: 59 | Admitting: Family Medicine

## 2018-07-17 DIAGNOSIS — J0100 Acute maxillary sinusitis, unspecified: Secondary | ICD-10-CM

## 2018-07-17 MED ORDER — LEVOFLOXACIN 500 MG PO TABS: 500.0000 mg | ORAL_TABLET | Freq: Every day | ORAL | 0 refills | Status: DC

## 2018-07-17 MED ORDER — IPRATROPIUM BROMIDE 0.06 % NA SOLN: 2.0000 | Freq: Four times a day (QID) | NASAL | 0 refills | Status: DC

## 2018-07-17 NOTE — Progress Notes (Signed)
Virtual Visit via Telephone The purpose of this virtual visit is to provide medical care while limiting exposure to the novel coronavirus (COVID19) for both patient and office staff.  Consent was obtained for phone visit:  Yes.   Answered questions that patient had about telehealth interaction:  Yes.   I discussed the limitations, risks, security and privacy concerns of performing an evaluation and management service by telephone. I also discussed with the patient that there may be a patient responsible charge related to this service. The patient expressed understanding and agreed to proceed.  Patient Location: Home Provider Location: [facility]  ---------------------------------------------------------------------- Chief Complaint  Patient presents with  . Sinus Problem    sun poisoning onset 4 days, sore throat went to [facility] and heat feels like having sinusitis and sunburn, brownish mucus     S: Reviewed CMA documentation. I have called patient and gathered additional HPI as follows:  ACUTE SINUSITIS Reports that symptoms started 1 week ago with sinus congestion and thicker brown mucus, now worsening in past 4 [place] by her report, using topical with some temporary relief. In past similar episodes of sinusitis treated with antibiotic with resolution. Did not go to in to work today needs a note.  Denies any high risk travel to areas of current concern for COVID19. Denies any known or suspected exposure to person with or possibly with COVID19.  Denies any fevers, chills, sweats, body ache, cough, shortness of breath, headache, abdominal pain, diarrhea  Past Medical History:  Diagnosis Date  . Acid reflux 09/09/2014  . Adjustment disorder with depressed mood 11/14/2006   ASSESSMENT: Bereavement. Recommend counseling with pastor or psychologist.   . Benign neoplasm of skin 11/14/2006  . Cephalalgia 02/10/2006  . Chronic infection of  sinus 09/09/2014  . Extreme obesity 02/10/2006  . LBP (low back pain) 01/15/2007   Chronic back strain and spasms due to obesity.   . Sleep related leg cramps 09/09/2014   Social History   Tobacco Use  . Smoking status: Never Smoker  . Smokeless tobacco: Never Used  Substance Use Topics  . Alcohol use: Yes    Alcohol/week: 0.0 standard drinks    Comment: rarely  . Drug use: No    Current Outpatient Medications:  .  albuterol (PROVENTIL HFA;VENTOLIN HFA) 108 (90 Base) MCG/ACT inhaler, Inhale 2 puffs into the lungs every 4 (four) hours as needed for wheezing or shortness of breath (cough)., Disp: 1 Inhaler, Rfl: 0 .  clotrimazole-betamethasone (LOTRISONE) cream, APPLY TOPICALLY TO GENITAL AREA FOR VAGINAL ITCHING AND IRRITATION FOR UP TO 2 WEEKS, THEN STOP., Disp: 45 g, Rfl: 0 .  cyclobenzaprine (FLEXERIL) 10 MG tablet, Take 1 tablet (10 mg total) by mouth 3 (three) times daily as needed for muscle spasms., Disp: 30 tablet, Rfl: 1 .  hydrochlorothiazide (HYDRODIURIL) 25 MG tablet, TAKE 1 TABLET BY MOUTH ONCE DAILY, Disp: 90 tablet, Rfl: 3 .  hydrocortisone 2.5 % cream, APPLY  CREAM TO AFFECTED AREA TWICE DAILY AS NEEDED ECZEMA, Disp: 28 g, Rfl: 2 .  ibuprofen (ADVIL) 800 MG tablet, TAKE 1 TABLET BY MOUTH THREE TIMES DAILY, Disp: 90 tablet, Rfl: 0 .  ketoconazole (NIZORAL) 2 % cream, APPLY TOPICALLY DAILY AS NEEDED FOR IRRITATION, Disp: 30 g, Rfl: 2 .  lisinopril (PRINIVIL,ZESTRIL) 20 MG tablet, Take 1 tablet (20 mg total) by mouth daily., Disp: 90 tablet, Rfl: 3 .  nystatin (MYCOSTATIN/NYSTOP) powder, Apply topically 3 (three) times daily. Under  breasts for 1-2 weeks or until healed, Disp: 45 g, Rfl: 1 .  orphenadrine (NORFLEX) 100 MG tablet, TAKE 1 TABLET BY MOUTH TWICE DAILY AS NEEDED FOR MUSCLE SPASM, Disp: 180 tablet, Rfl: 0 .  terbinafine (LAMISIL) 250 MG tablet, Take 1 tablet (250 mg total) by mouth daily. For 2 weeks, Disp: 14 tablet, Rfl: 0 .  triamcinolone cream (KENALOG) 0.1 %, APPLY  TWICE DAILY UNTIL CLEAR, Disp: , Rfl: 1 .  Vitamin D, Ergocalciferol, (DRISDOL) 1.25 MG (50000 UT) CAPS capsule, Take 1 capsule (50,000 Units total) by mouth once a week. For 12 weeks. Then start OTC Vitamin D3 2,000 unit daily., Disp: 12 capsule, Rfl: 0 .  brompheniramine-pseudoephedrine-DM 30-2-10 MG/5ML syrup, Take 5 mLs by mouth 4 (four) times daily as needed. (Patient not taking: Reported on 07/17/2018), Disp: 118 mL, Rfl: 0 .  cephALEXin (KEFLEX) 500 MG capsule, Take 1 capsule (500 mg total) by mouth 3 (three) times daily. For 7 days (Patient not taking: Reported on 07/17/2018), Disp: 21 capsule, Rfl: 0 .  fluconazole (DIFLUCAN) 150 MG tablet, Take one tablet by mouth today. Then after antibiotics, may take 2nd pill, wait 24 hours, and then take 3rd pill to finish treatment. (Patient not taking: Reported on 07/17/2018), Disp: 3 tablet, Rfl: 0 .  fluticasone (FLONASE) 50 MCG/ACT nasal spray, Place 2 sprays into both nostrils daily. Use for 4-6 weeks then stop and use seasonally or as needed. (Patient not taking: Reported on 07/17/2018), Disp: 16 g, Rfl: 3 .  ipratropium (ATROVENT) 0.06 % nasal spray, Place 2 sprays into both nostrils 4 (four) times daily. For up to 5-7 days then stop., Disp: 15 mL, Rfl: 0 .  levofloxacin (LEVAQUIN) 500 MG tablet, Take 1 tablet (500 mg total) by mouth daily. For 7 days, Disp: 7 tablet, Rfl: 0  Depression screen [facility] 2/9 07/17/2018 04/10/2018 02/15/2018  Decreased Interest 0 0 0  Down, Depressed, Hopeless 0 0 0  PHQ - 2 Score 0 0 0    No flowsheet data found.  -------------------------------------------------------------------------- O: No physical exam performed due to remote telephone encounter.  Lab results reviewed.  No results found for this or any previous visit (from the past 2160 hour(s)).  -------------------------------------------------------------------------- A&P:  Problem List Items Addressed This Visit    None    Visit Diagnoses    Acute  non-recurrent maxillary sinusitis    -  Primary   Relevant Medications   levofloxacin (LEVAQUIN) 500 MG tablet   ipratropium (ATROVENT) 0.06 % nasal spray     Consistent with acute maxillary vs frontal  sinusitis, likely initially allergic rhinitis component with worsening concern for bacterial infection.   Plan: 1. Start taking Levaquin antibiotic 500mg  daily x 7 days 2. Start Atrovent nasal spray decongestant 2 sprays in each nostril up to 4 times daily for 7 days 3. Supportive care 4. Return criteria reviewed   Meds ordered this encounter  Medications  . levofloxacin (LEVAQUIN) 500 MG tablet    Sig: Take 1 tablet (500 mg total) by mouth daily. For 7 days    Dispense:  7 tablet    Refill:  0  . ipratropium (ATROVENT) 0.06 % nasal spray    Sig: Place 2 sprays into both nostrils 4 (four) times daily. For up to 5-7 days then stop.    Dispense:  15 mL    Refill:  0    Follow-up: - Return as needed  Patient verbalizes understanding with the above medical recommendations including the limitation of remote  medical advice.  Specific follow-up and call-back criteria were given for patient to follow-up or seek medical care more urgently if needed.   - Time spent in direct consultation with patient on phone: 12 minutes  [name], [facility] 07/17/2018, 2:42 PM

## 2018-07-17 NOTE — Patient Instructions (Signed)
AVS given by phone. 

## 2018-07-24 ENCOUNTER — Other Ambulatory Visit: Payer: Self-pay | Admitting: Family Medicine

## 2018-07-24 DIAGNOSIS — J0110 Acute frontal sinusitis, unspecified: Secondary | ICD-10-CM

## 2018-07-24 DIAGNOSIS — B372 Candidiasis of skin and nail: Secondary | ICD-10-CM

## 2018-07-24 DIAGNOSIS — S39012A Strain of muscle, fascia and tendon of lower back, initial encounter: Secondary | ICD-10-CM

## 2018-07-24 DIAGNOSIS — I10 Essential (primary) hypertension: Secondary | ICD-10-CM

## 2018-07-24 DIAGNOSIS — M62830 Muscle spasm of back: Secondary | ICD-10-CM

## 2018-07-24 MED ORDER — CLOTRIMAZOLE-BETAMETHASONE 1-0.05 % EX CREA: TOPICAL_CREAM | CUTANEOUS | 0 refills | Status: DC

## 2018-07-24 MED ORDER — FLUTICASONE PROPIONATE 50 MCG/ACT NA SUSP: 2.0000 | Freq: Every day | NASAL | 3 refills | Status: DC

## 2018-07-24 NOTE — Telephone Encounter (Signed)
Last week she was treated with Levaquin antibiotic, which is one of the stronger antibiotics we have for sinus infection. If this did not resolve it then it is unlikely to be a bacteria infection.  She may try Flonase if not already using.  Most likely then chronic sinus issue instead can be from allergies etc. She can return to [facility] for 2nd opinion if she prefers.  [name], [facility] 07/24/2018, 12:50 PM

## 2018-07-24 NOTE — Telephone Encounter (Signed)
Shannon Small called said that  Sinus pills that was called in last week was not helping she is requesting that you call something else in.

## 2018-07-25 NOTE — Telephone Encounter (Signed)
Patient advised.

## 2018-07-26 ENCOUNTER — Encounter: Payer: Self-pay | Admitting: Medical Oncology

## 2018-07-26 ENCOUNTER — Telehealth: Payer: Self-pay

## 2018-07-26 ENCOUNTER — Emergency Department
Admission: EM | Admit: 2018-07-26 | Discharge: 2018-07-26 | Disposition: A | Discharge status: Home / Self Care | Payer: 59 | Attending: Emergency Medicine | Admitting: Emergency Medicine

## 2018-07-26 ENCOUNTER — Emergency Department: Payer: 59

## 2018-07-26 ENCOUNTER — Other Ambulatory Visit: Payer: Self-pay

## 2018-07-26 DIAGNOSIS — I10 Essential (primary) hypertension: Secondary | ICD-10-CM | POA: Diagnosis not present

## 2018-07-26 DIAGNOSIS — L03115 Cellulitis of right lower limb: Secondary | ICD-10-CM | POA: Diagnosis not present

## 2018-07-26 DIAGNOSIS — R2241 Localized swelling, mass and lump, right lower limb: Secondary | ICD-10-CM | POA: Diagnosis present

## 2018-07-26 DIAGNOSIS — R609 Edema, unspecified: Secondary | ICD-10-CM

## 2018-07-26 LAB — CBC
HCT: 38.3 % (ref 36.0–46.0)
Hemoglobin: 12.5 g/dL (ref 12.0–15.0)
MCH: 29.4 pg (ref 26.0–34.0)
MCHC: 32.6 g/dL (ref 30.0–36.0)
MCV: 90.1 fL (ref 80.0–100.0)
Platelets: 222 10*3/uL (ref 150–400)
RBC: 4.25 MIL/uL (ref 3.87–5.11)
RDW: 12.7 % (ref 11.5–15.5)
WBC: 7.4 10*3/uL (ref 4.0–10.5)
nRBC: 0 % (ref 0.0–0.2)

## 2018-07-26 LAB — BASIC METABOLIC PANEL
Anion gap: 9 (ref 5–15)
BUN: 21 mg/dL — ABNORMAL HIGH (ref 6–20)
CO2: 22 mmol/L (ref 22–32)
Calcium: 8.7 mg/dL — ABNORMAL LOW (ref 8.9–10.3)
Chloride: 108 mmol/L (ref 98–111)
Creatinine, Ser: 0.62 mg/dL (ref 0.44–1.00)
GFR calc Af Amer: >60 mL/min (ref >60)
GFR calc non Af Amer: >60 mL/min (ref >60)
Glucose, Bld: 96 mg/dL (ref 70–99)
Potassium: 3.7 mmol/L (ref 3.5–5.1)
Sodium: 139 mmol/L (ref 135–145)

## 2018-07-26 MED ORDER — CEPHALEXIN 500 MG PO CAPS: 500.0000 mg | ORAL_CAPSULE | Freq: Four times a day (QID) | ORAL | 0 refills | Status: AC

## 2018-07-26 MED ORDER — FUROSEMIDE 20 MG PO TABS: 20.0000 mg | ORAL_TABLET | Freq: Every day | ORAL | 0 refills | Status: DC

## 2018-07-26 NOTE — ED Triage Notes (Addendum)
Pt reports that for the past month she has been noticing swelling to the right side of her body, states that it is worse at night and that its more in her foot than anything. PT denies pain. States that she recently lost weight so she took herself off of her BP meds.

## 2018-07-26 NOTE — Telephone Encounter (Signed)
The pt called stating she develop swelling on her left side of her body. She complains her leg is so swallowing that its painful. She denies injury or SOB, but admits she have had episodes of intermittent swelling this week. I recommended that the patient seek urgent care.

## 2018-07-26 NOTE — ED Provider Notes (Signed)
[facility] Emergency Department Provider Note       Time seen: ----------------------------------------- 10:06 AM on 07/26/2018 -----------------------------------------   I have reviewed the triage vital signs and the nursing notes.  HISTORY   Chief Complaint Leg Swelling    HPI Shannon Small is a 58 y.o. female with a history of GERD, low back pain, adjustment disorder with depressed mood who presents to the ED for swelling.  Patient noted swelling on the entire right side of her body that is much worse in her right leg.  She complains of some abnormal feeling around her right ankle as well.  She states this is worse at night and worse around her foot and ankle.  Recently she lost weight.  Past Medical History:  Diagnosis Date  . Acid reflux 09/09/2014  . Adjustment disorder with depressed mood 11/14/2006   ASSESSMENT: Bereavement. Recommend counseling with pastor or psychologist.   . Benign neoplasm of skin 11/14/2006  . Cephalalgia 02/10/2006  . Chronic infection of sinus 09/09/2014  . Extreme obesity 02/10/2006  . LBP (low back pain) 01/15/2007   Chronic back strain and spasms due to obesity.   . Sleep related leg cramps 09/09/2014    Patient Active Problem List   Diagnosis Date Noted  . Right knee pain 09/21/2017  . Chronic cough 03/02/2016  . Allergic rhinitis due to allergen 03/02/2016  . Pre-diabetes 03/01/2016  . Urinary incontinence, mixed 03/01/2016  . Hypertension 09/09/2014  . Acid reflux 09/09/2014  . Sleep related leg cramps 09/09/2014  . Low back pain 01/15/2007  . Benign neoplasm of skin 11/14/2006  . Cephalalgia 02/10/2006  . Morbid obesity with BMI of 45.0-49.9, adult ([place]) 02/10/2006    Past Surgical History:  Procedure Laterality Date  . ABDOMINAL HYSTERECTOMY    . RHINOPLASTY  1982    Allergies Patient has no known allergies.  Social History Social History   Tobacco Use  . Smoking status: Never Smoker  . Smokeless  tobacco: Never Used  Substance Use Topics  . Alcohol use: Yes    Alcohol/week: 0.0 standard drinks    Comment: rarely  . Drug use: No   Review of Systems Constitutional: Negative for fever. Cardiovascular: Negative for chest pain. Respiratory: Negative for shortness of breath. Gastrointestinal: Negative for abdominal pain, vomiting and diarrhea. Musculoskeletal: Positive for leg swelling and right leg pain Skin: Negative for rash. Neurological: Negative for headaches, focal weakness or numbness.  All systems negative/normal/unremarkable except as stated in the HPI  ____________________________________________   PHYSICAL EXAM:  VITAL SIGNS: ED Triage Vitals [07/26/18 0931]  Enc Vitals Group     BP (!) 197/90     Pulse Rate 93     Resp 18     Temp 98 F (36.7 C)     Temp Source Oral     SpO2 98 %     Weight 280 lb (127 kg)     Height 5\' 5"  (1.651 m)     Head Circumference      Peak Flow      Pain Score 0     Pain Loc      Pain Edu?      Excl. in [place]?    Constitutional: Alert and oriented. Well appearing and in no distress. Eyes: Conjunctivae are normal. Normal extraocular movements. Cardiovascular: Normal rate, regular rhythm. No murmurs, rubs, or gallops. Respiratory: Normal respiratory effort without tachypnea nor retractions. Breath sounds are clear and equal bilaterally. No wheezes/rales/rhonchi. Gastrointestinal: Soft and  nontender. Normal bowel sounds Musculoskeletal: Nontender with normal range of motion in extremities.  There is some erythema around the right lower leg and ankle anteriorly, right leg does appear to be more swollen than the left.  Good peripheral pulses are noted Neurologic:  Normal speech and language. No gross focal neurologic deficits are appreciated.  Skin:  Skin is warm, dry and intact.  Some erythema around the right ankle is noted Psychiatric: Mood and affect are normal. Speech and behavior are normal.   ___________________________________________  ED COURSE:  As part of my medical decision making, I reviewed the following data within the [place] History obtained from family if available, nursing notes, old chart and ekg, as well as notes from prior ED visits. Patient presented for right leg swelling, we will assess with labs and imaging as indicated at this time.   Procedures  Shannon Small was evaluated in Emergency Department on 07/26/2018 for the symptoms described in the history of present illness. She was evaluated in the context of the global COVID-19 pandemic, which necessitated consideration that the patient might be at risk for infection with the SARS-CoV-2 virus that causes COVID-19. Institutional protocols and algorithms that pertain to the evaluation of patients at risk for COVID-19 are in a state of rapid change based on information released by regulatory bodies including the CDC and federal and state organizations. These policies and algorithms were followed during the patient's care in the ED.  ____________________________________________   LABS (pertinent positives/negatives)  Labs Reviewed  BASIC METABOLIC PANEL - Abnormal; Notable for the following components:      Result Value   BUN 21 (*)    Calcium 8.7 (*)    All other components within normal limits  CBC    RADIOLOGY Images were viewed by me  Ultrasound of the right lower extremity IMPRESSION: No evidence of DVT within the right lower extremity. ____________________________________________   DIFFERENTIAL DIAGNOSIS   Edema, cellulitis, DVT, venous stasis  FINAL ASSESSMENT AND PLAN  Peripheral edema, cellulitis   Plan: The patient had presented for leg swelling. Patient's labs did not reveal any acute process. Patient's imaging was negative for DVT.  She will be treated for cellulitis and is cleared for outpatient follow-up with her doctor.   Laurence Aly, MD    Note:  This note was generated in part or whole with voice recognition software. Voice recognition is usually quite accurate but there are transcription errors that can and very often do occur. I apologize for any typographical errors that were not detected and corrected.     Earleen Newport, MD 07/26/18 [phone]

## 2018-07-26 NOTE — Telephone Encounter (Signed)
Agree with triage, patient advised to seek more acute care for significant acute leg swelling with pain.  Shannon Small, [facility] 07/26/2018, 11:52 AM

## 2018-07-27 ENCOUNTER — Telehealth: Payer: Self-pay | Admitting: Family Medicine

## 2018-07-27 NOTE — Telephone Encounter (Signed)
ED wrote her a note to go back to work on Saturday 07/28/18.  She can either contact them for an updated note, or I can write it for return on Tuesday, I am not sure best way to get note to her though.  [name], [facility] 07/27/2018, 12:55 PM

## 2018-07-27 NOTE — Telephone Encounter (Signed)
The pt is requesting that you write the note to return on Tuesday.

## 2018-07-27 NOTE — Telephone Encounter (Signed)
Work note has been written, printed, signed - in my outbox.  Can you notify patient to pick it up?  [name], [facility] 07/27/2018, 5:09 PM

## 2018-07-27 NOTE — Telephone Encounter (Signed)
Pt called said that she was in ER yesterday said that she  Need a note for work to return on  Tuesday

## 2018-07-30 NOTE — Telephone Encounter (Signed)
Patient informed about work note and as per her request it was mailed.

## 2018-08-20 ENCOUNTER — Other Ambulatory Visit: Payer: Self-pay

## 2018-08-20 ENCOUNTER — Ambulatory Visit (INDEPENDENT_AMBULATORY_CARE_PROVIDER_SITE_OTHER): Payer: 59 | Admitting: Family Medicine

## 2018-08-20 ENCOUNTER — Encounter: Payer: Self-pay | Admitting: Family Medicine

## 2018-08-20 VITALS — BP 138/62 | HR 82 | Temp 98.2°F | Resp 16 | Ht 65.0 in | Wt 306.6 lb

## 2018-08-20 DIAGNOSIS — J329 Chronic sinusitis, unspecified: Secondary | ICD-10-CM

## 2018-08-20 DIAGNOSIS — I872 Venous insufficiency (chronic) (peripheral): Secondary | ICD-10-CM

## 2018-08-20 MED ORDER — PSEUDOEPH-BROMPHEN-DM 30-2-10 MG/5ML PO SYRP: 5.0000 mL | ORAL_SOLUTION | Freq: Four times a day (QID) | ORAL | 0 refills | Status: DC | PRN

## 2018-08-20 MED ORDER — TRIAMCINOLONE ACETONIDE 0.5 % EX CREA: 1.0000 "application " | TOPICAL_CREAM | Freq: Two times a day (BID) | CUTANEOUS | 0 refills | Status: DC

## 2018-08-20 MED ORDER — DOXYCYCLINE HYCLATE 100 MG PO TABS: 100.0000 mg | ORAL_TABLET | Freq: Two times a day (BID) | ORAL | 0 refills | Status: DC

## 2018-08-20 MED ORDER — FUROSEMIDE 20 MG PO TABS: 20.0000 mg | ORAL_TABLET | Freq: Every day | ORAL | 0 refills | Status: DC | PRN

## 2018-08-20 NOTE — Progress Notes (Signed)
Subjective:    Patient ID: Shannon Small, female    DOB: 14-Aug-1960, 58 y.o.   MRN: [id number]  Shannon Small is a 58 y.o. female presenting on 08/20/2018 for Rash (itchy mostly both legs also some in back) and Sinus Problem (stuffy nose, mild sore throat from nasal drainge onset couple of weeks)   HPI   ED FOLLOW-UP VISIT  Hospital/Location: [facility] Date of ED Visit: 07/26/18  Reason for Presenting to ED: Leg swelling redness Primary (+Secondary) Diagnosis: Cellulitis RLE, Edema  FOLLOW-UP  - ED provider note and record have been reviewed - Patient presents today about 25 days after recent ED visit. Brief summary of recent course, patient had symptoms of RLE with erythema, presented to ED on 7/16, testing in ED with RLE Venous Doppler negative for DVT, treated with keflex antibiotic and lasix. - Today reports overall has done well after discharge from ED initially however symptoms recurred and now return with redness and swelling  - New medications on discharge: Keflex, Furosemide  Admits redness and burning at times in RLE. She is not elevating it and is often seated with leg down for most of day.    Admits still has recurrent sinus infection, seemed improved on Levaquin about 1 month ago but then symptoms returned with some sinus drainage yellow, thicker drainage and throat irritation with occasional cough. Denies any fevers, abnormal taste or smell, dyspnea, productive cough, body ache, nausea vomiting, diarrhea.    Depression screen [facility] 2/9 08/20/2018 07/17/2018 04/10/2018  Decreased Interest 0 0 0  Down, Depressed, Hopeless 0 0 0  PHQ - 2 Score 0 0 0    Social History   Tobacco Use  . Smoking status: Never Smoker  . Smokeless tobacco: Never Used  Substance Use Topics  . Alcohol use: Yes    Alcohol/week: 0.0 standard drinks    Comment: rarely  . Drug use: No    Review of Systems Per HPI unless specifically indicated above     Objective:    BP 138/62   Pulse 82    Temp 98.2 F (36.8 C) (Oral)   Resp 16   Ht 5\' 5"  (1.651 m)   Wt (!) 306 lb 9.6 oz (139.1 kg)   BMI 51.02 kg/m   Wt Readings from Last 3 Encounters:  08/20/18 (!) 306 lb 9.6 oz (139.1 kg)  07/26/18 280 lb (127 kg)  02/15/18 296 lb 6.4 oz (134.4 kg)    Physical Exam Vitals signs and nursing note reviewed.  Constitutional:      General: She is not in acute distress.    Appearance: She is well-developed. She is not diaphoretic.     Comments: Well-appearing, comfortable, cooperative, obese  HENT:     Head: Normocephalic and atraumatic.     Comments: Maxillary sinus tender Eyes:     General:        Right eye: No discharge.        Left eye: No discharge.     Conjunctiva/sclera: Conjunctivae normal.  Neck:     Musculoskeletal: Normal range of motion and neck supple.     Thyroid: No thyromegaly.  Cardiovascular:     Rate and Rhythm: Normal rate and regular rhythm.     Heart sounds: Normal heart sounds. No murmur.  Pulmonary:     Effort: Pulmonary effort is normal. No respiratory distress.     Breath sounds: Normal breath sounds. No wheezing or rales.  Musculoskeletal: Normal range of motion.  Right lower leg: Edema (worse than Left, with anterior erythema and warmthy, with some superficial breaks in skin without oozing or drainage) present.  Lymphadenopathy:     Cervical: No cervical adenopathy.  Skin:    General: Skin is warm and dry.     Findings: No erythema or rash.  Neurological:     Mental Status: She is alert and oriented to person, place, and time.  Psychiatric:        Behavior: Behavior normal.     Comments: Well groomed, good eye contact, normal speech and thoughts      I have personally reviewed the radiology report from 07/26/18 on US Doppler.  US Venous Img Lower Unilateral RightPerformed 07/26/2018 Final result  Study Result CLINICAL DATA: Right lower extremity pain and edema for the past several months. Evaluate for DVT.  EXAM: RIGHT LOWER  EXTREMITY VENOUS DOPPLER ULTRASOUND  TECHNIQUE: Gray-scale sonography with graded compression, as well as color Doppler and duplex ultrasound were performed to evaluate the lower extremity deep venous systems from the level of the common femoral vein and including the common femoral, femoral, profunda femoral, popliteal and calf veins including the posterior tibial, peroneal and gastrocnemius veins when visible. The superficial great saphenous vein was also interrogated. Spectral Doppler was utilized to evaluate flow at rest and with distal augmentation maneuvers in the common femoral, femoral and popliteal veins.  COMPARISON: None.  FINDINGS: Contralateral Common Femoral Vein: Respiratory phasicity is normal and symmetric with the symptomatic side. No evidence of thrombus. Normal compressibility.  Common Femoral Vein: No evidence of thrombus. Normal compressibility, respiratory phasicity and response to augmentation.  Saphenofemoral Junction: No evidence of thrombus. Normal compressibility and flow on color Doppler imaging.  Profunda Femoral Vein: No evidence of thrombus. Normal compressibility and flow on color Doppler imaging.  Femoral Vein: No evidence of thrombus. Normal compressibility, respiratory phasicity and response to augmentation.  Popliteal Vein: No evidence of thrombus. Normal compressibility, respiratory phasicity and response to augmentation.  Calf Veins: No evidence of thrombus. Normal compressibility and flow on color Doppler imaging.  Superficial Great Saphenous Vein: No evidence of thrombus. Normal compressibility.  Venous Reflux: None.  Other Findings: None.  IMPRESSION: No evidence of DVT within the right lower extremity.   Electronically Signed By: Sandi Mariscal M.D. On: 07/26/2018 11:46   Results for orders placed or performed during the hospital encounter of 45/62/56  Basic metabolic panel  Result Value Ref Range   Sodium 139 135 - 145  mmol/L   Potassium 3.7 3.5 - 5.1 mmol/L   Chloride 108 98 - 111 mmol/L   CO2 22 22 - 32 mmol/L   Glucose, Bld 96 70 - 99 mg/dL   BUN 21 (H) 6 - 20 mg/dL   Creatinine, Ser 0.62 0.44 - 1.00 mg/dL   Calcium 8.7 (L) 8.9 - 10.3 mg/dL   GFR calc non Af Amer >60 >60 mL/min   GFR calc Af Amer >60 >60 mL/min   Anion gap 9 5 - 15  CBC  Result Value Ref Range   WBC 7.4 4.0 - 10.5 K/uL   RBC 4.25 3.87 - 5.11 MIL/uL   Hemoglobin 12.5 12.0 - 15.0 g/dL   HCT 38.3 36.0 - 46.0 %   MCV 90.1 80.0 - 100.0 fL   MCH 29.4 26.0 - 34.0 pg   MCHC 32.6 30.0 - 36.0 g/dL   RDW 12.7 11.5 - 15.5 %   Platelets 222 150 - 400 K/uL   nRBC 0.0 0.0 - 0.2 %  Assessment & Plan:   Problem List Items Addressed This Visit    None    Visit Diagnoses    Acute venous stasis dermatitis of right lower extremity    -  Primary  Clinically with persistent R>L lower extremity swelling with edema and erythema, recently treated and improved temporarily for cellulitis by ED nearly 1 month ago now returned. - Not adhering to elevation compression - Complicated by morbid obesity  Plan - Doxycycline for both stasis dermatitis and for sinus - Re order Furosemide 20mg  take 1-2 pills daily PRN 3-5 days - Rx topical Triamcinolone cream 0.5% - Handwritten rx compression stocking moderate 20-98mmHg, take to pharmacy or med supply store - Also referral to [facility] for chronic LE edema    Relevant Medications   doxycycline (VIBRA-TABS) 100 MG tablet   furosemide (LASIX) 20 MG tablet   triamcinolone cream (KENALOG) 0.5 %   Other Relevant Orders   Ambulatory referral to Vascular Surgery   Recurrent sinusitis       Relevant Medications   doxycycline (VIBRA-TABS) 100 MG tablet      Consistent with subacute vs recurrent maxillary sinusitis, with worsening concern for bacterial infection. Previously improve but then return now.  Plan: 1. Start taking Doxycycline antibiotic 100mg  twice daily for 10 days. Take  with full glass of water and stay upright for at least 30 min after taking, may be seated or standing, but should NOT lay down. This is just a safety precaution, if this medicine does not go all the way down throat well it could cause some burning discomfort to throat and esophagus. 2. Use other supportive care / sinus medicine as previously advised 3. ADD Bromfed cough syrup PRN 4. Return criteria reviewed    Meds ordered this encounter  Medications  . doxycycline (VIBRA-TABS) 100 MG tablet    Sig: Take 1 tablet (100 mg total) by mouth 2 (two) times daily. For 10 days. Take with full glass of water, stay upright 30 min after taking.    Dispense:  20 tablet    Refill:  0  . furosemide (LASIX) 20 MG tablet    Sig: Take 1-2 tablets (20-40 mg total) by mouth daily as needed for edema. Use for short term only up to 3-5 days then stop, and may repeat    Dispense:  30 tablet    Refill:  0  . triamcinolone cream (KENALOG) 0.5 %    Sig: Apply 1 application topically 2 (two) times daily. To affected areas, for up to 2 weeks.    Dispense:  30 g    Refill:  0    Orders Placed This Encounter  Procedures  . Ambulatory referral to Vascular Surgery    Referral Priority:   Routine    Referral Type:   Surgical    Referral Reason:   Specialty Services Required    Requested Specialty:   Vascular Surgery    Number of Visits Requested:   1      Follow up plan: Return in about 4 weeks (around 09/17/2018), or if symptoms worsen or fail to improve, for edema if not improved.   [name], [facility] 08/20/2018, 10:00 AM

## 2018-08-20 NOTE — Patient Instructions (Addendum)
Thank you for coming to the office today.  Start taking Doxycycline antibiotic 100mg  twice daily for 10 days. Take with full glass of water and stay upright for at least 30 min after taking, may be seated or standing, but should NOT lay down. This is just a safety precaution, if this medicine does not go all the way down throat well it could cause some burning discomfort to throat and esophagus.  Use furosemide lasix as needed for swelling 1-2 pills for 3-5 days then as needed.  Use compression stockings.  .[facility] [address] [place], [place] [address] Open until [place] Phone: [phone]  Work note  Use topical triamcinolone cream on leg to help reduce redness irritation and burning itching.  [facility] [place], [place] [address]  Main: [phone]   Please schedule a Follow-up Appointment to: Return in about 4 weeks (around 09/17/2018), or if symptoms worsen or fail to improve, for edema if not improved.  If you have any other questions or concerns, please feel free to call the office or send a message through [facility]. You may also schedule an earlier appointment if necessary.  Additionally, you may be receiving a survey about your experience at our office within a few days to 1 week by e-mail or mail. We value your feedback.  [name], DO [place]

## 2018-08-24 ENCOUNTER — Telehealth: Payer: Self-pay | Admitting: Family Medicine

## 2018-08-24 NOTE — Telephone Encounter (Signed)
Please notify patient that she can use a benadryl cream if she would like. The triamcinolone should be reducing some itch.  She can follow up Vascular to discuss these symptoms more.  I wrote letter to keep her out Saturday. She will need to pick it up from office.  Shannon Small, [facility] 08/24/2018, 12:07 PM

## 2018-08-24 NOTE — Telephone Encounter (Signed)
Pt notified she will come to the office next week.

## 2018-08-24 NOTE — Telephone Encounter (Signed)
Pt called said that her leg was not any better pt state is was very itchy, pt wanted to know if she could put a cream benadryl over the medication that was given . Pt also is requesting a letter to be out of work on Saturday.

## 2018-08-27 ENCOUNTER — Telehealth: Payer: Self-pay | Admitting: Family Medicine

## 2018-08-27 ENCOUNTER — Telehealth: Payer: Self-pay

## 2018-08-27 NOTE — Telephone Encounter (Signed)
I do not have stronger antibiotics for her leg.  She was referred to Vascular specialist for this problem.  They tried to call her on 08/23/18 but could not get to her to leave message because voicemail was not setup.  She needs to call  [facility] [place], [place] [address]  Main: [phone]   To see if they can schedule her.  If she cannot get in - then she may have to go back to Emergency Dept - she was seen there in mid July 2020 already, and now concern it is getting worse.  [name], [facility] 08/27/2018, 10:23 AM

## 2018-08-27 NOTE — Telephone Encounter (Signed)
Patient advised as per Dr. Raliegh Ip she will give them a call.

## 2018-08-27 NOTE — Telephone Encounter (Signed)
Patient was advised for 24 hours notice for phone call, the antibiotics Doxycycline and KENALOG cream is not improving her itchiness. It is still red and spreading now it's on Left leg, itchy. She wants something stronger. Please suggest.

## 2018-08-27 NOTE — Telephone Encounter (Signed)
Pt called that the medicine was not working her that spot was spreading and getting worse wanted to know if you would call in something stronger.

## 2018-08-27 NOTE — Telephone Encounter (Signed)
See 2nd message already on this patient today.  Shannon Small, [facility] 08/27/2018, 10:24 AM

## 2018-08-29 ENCOUNTER — Other Ambulatory Visit: Payer: Self-pay

## 2018-08-29 DIAGNOSIS — Z79899 Other long term (current) drug therapy: Secondary | ICD-10-CM | POA: Insufficient documentation

## 2018-08-29 DIAGNOSIS — R2243 Localized swelling, mass and lump, lower limb, bilateral: Secondary | ICD-10-CM | POA: Insufficient documentation

## 2018-08-29 DIAGNOSIS — M79604 Pain in right leg: Secondary | ICD-10-CM | POA: Insufficient documentation

## 2018-08-29 DIAGNOSIS — M79605 Pain in left leg: Secondary | ICD-10-CM | POA: Diagnosis not present

## 2018-08-29 DIAGNOSIS — L03119 Cellulitis of unspecified part of limb: Secondary | ICD-10-CM | POA: Diagnosis not present

## 2018-08-29 NOTE — ED Triage Notes (Addendum)
Pt arrives to ED via POV from home with c/o bilateral leg swelling and rash x1 month. Pt reports being seen for same here and with her PCP without improvement. Pt denies any h/x of CHF, does not take diuretics. No c/o N/V/D; no fever, CP or SHOB. Pt states the "stinging and burning got so bad tonight I didn't want to lose my leg". Pt is A&O, in NAD; RR even, regular, and unlabored.  Added: at the end of Triage, pt produced 2 bottles of medication that her PCP r/x'd her. One was Doxycycline and the other Furosemide.

## 2018-08-30 ENCOUNTER — Emergency Department: Payer: 59

## 2018-08-30 ENCOUNTER — Emergency Department
Admission: EM | Admit: 2018-08-30 | Discharge: 2018-08-30 | Disposition: A | Discharge status: Home / Self Care | Payer: 59 | Attending: Emergency Medicine | Admitting: Emergency Medicine

## 2018-08-30 DIAGNOSIS — L03119 Cellulitis of unspecified part of limb: Secondary | ICD-10-CM

## 2018-08-30 DIAGNOSIS — M7989 Other specified soft tissue disorders: Secondary | ICD-10-CM

## 2018-08-30 LAB — CBC WITH DIFFERENTIAL/PLATELET
Abs Immature Granulocytes: 0.03 10*3/uL (ref 0.00–0.07)
Basophils Absolute: 0 10*3/uL (ref 0.0–0.1)
Basophils Relative: 0 %
Eosinophils Absolute: 0.6 10*3/uL — ABNORMAL HIGH (ref 0.0–0.5)
Eosinophils Relative: 7 %
HCT: 40.4 % (ref 36.0–46.0)
Hemoglobin: 13 g/dL (ref 12.0–15.0)
Immature Granulocytes: 0 %
Lymphocytes Relative: 28 %
Lymphs Abs: 2.4 10*3/uL (ref 0.7–4.0)
MCH: 28.4 pg (ref 26.0–34.0)
MCHC: 32.2 g/dL (ref 30.0–36.0)
MCV: 88.4 fL (ref 80.0–100.0)
Monocytes Absolute: 0.6 10*3/uL (ref 0.1–1.0)
Monocytes Relative: 7 %
Neutro Abs: 4.8 10*3/uL (ref 1.7–7.7)
Neutrophils Relative %: 58 %
Platelets: 230 10*3/uL (ref 150–400)
RBC: 4.57 MIL/uL (ref 3.87–5.11)
RDW: 12.7 % (ref 11.5–15.5)
WBC: 8.4 10*3/uL (ref 4.0–10.5)
nRBC: 0 % (ref 0.0–0.2)

## 2018-08-30 LAB — BASIC METABOLIC PANEL
Anion gap: 7 (ref 5–15)
BUN: 29 mg/dL — ABNORMAL HIGH (ref 6–20)
CO2: 27 mmol/L (ref 22–32)
Calcium: 9.2 mg/dL (ref 8.9–10.3)
Chloride: 103 mmol/L (ref 98–111)
Creatinine, Ser: 0.69 mg/dL (ref 0.44–1.00)
GFR calc Af Amer: >60 mL/min (ref >60)
GFR calc non Af Amer: >60 mL/min (ref >60)
Glucose, Bld: 93 mg/dL (ref 70–99)
Potassium: 3.4 mmol/L — ABNORMAL LOW (ref 3.5–5.1)
Sodium: 137 mmol/L (ref 135–145)

## 2018-08-30 LAB — BRAIN NATRIURETIC PEPTIDE: B Natriuretic Peptide: 23 pg/mL (ref 0.0–100.0)

## 2018-08-30 MED ORDER — SODIUM CHLORIDE 0.9 % IV SOLN
1.0000 g | Freq: Once | INTRAVENOUS | Status: AC
  Administered 2018-08-30: 1 g via INTRAVENOUS
  Filled 2018-08-30: qty 10

## 2018-08-30 MED ORDER — DIPHENHYDRAMINE HCL 25 MG PO CAPS
50.0000 mg | ORAL_CAPSULE | Freq: Once | ORAL | Status: AC
  Administered 2018-08-30: 50 mg via ORAL
  Filled 2018-08-30: qty 2

## 2018-08-30 MED ORDER — DIPHENHYDRAMINE-ZINC ACETATE 2-0.1 % EX CREA: 1.0000 "application " | TOPICAL_CREAM | Freq: Three times a day (TID) | CUTANEOUS | 0 refills | Status: DC | PRN

## 2018-08-30 MED ORDER — CLINDAMYCIN HCL 300 MG PO CAPS: 300.0000 mg | ORAL_CAPSULE | Freq: Three times a day (TID) | ORAL | 0 refills | Status: DC

## 2018-08-30 NOTE — ED Provider Notes (Signed)
[facility] Emergency Department Provider Note  ____________________________________________  Time seen: Approximately 6:37 AM  I have reviewed the triage vital signs and the nursing notes.   HISTORY  Chief Complaint Leg Swelling and Rash   HPI Shannon Small is a 58 y.o. female with history of morbidly obesity, hypertension, depression who presents for evaluation of leg pain and swelling.  Patient has been struggling with swelling of her legs for about a month.  Was seen here with a negative Doppler studies.  She was diagnosed with cellulitis.  Took antibiotics with improvement of her symptoms.  Over the last week the swelling has become progressively worse and now both legs are red and warm to the touch with right worse than left.  She saw her primary care doctor 10 days ago and was given furosemide and doxycycline. In triage patient initially said she was not prescribed anything for her legs but then told me she has been taking these meds with no relief. No fever, no N/V, no chills, no CP, or SOB.  No history of CHF.  She is complaining of itching of her leg.  She has been putting topical steroids with no significant relief.    Past Medical History:  Diagnosis Date   Acid reflux 09/09/2014   Adjustment disorder with depressed mood 11/14/2006   ASSESSMENT: Bereavement. Recommend counseling with pastor or psychologist.    Benign neoplasm of skin 11/14/2006   Cephalalgia 02/10/2006   Chronic infection of sinus 09/09/2014   Extreme obesity 02/10/2006   LBP (low back pain) 01/15/2007   Chronic back strain and spasms due to obesity.    Sleep related leg cramps 09/09/2014    Patient Active Problem List   Diagnosis Date Noted   Right knee pain 09/21/2017   Chronic cough 03/02/2016   Allergic rhinitis due to allergen 03/02/2016   Pre-diabetes 03/01/2016   Urinary incontinence, mixed 03/01/2016   Hypertension 09/09/2014   Acid reflux 09/09/2014   Sleep  related leg cramps 09/09/2014   Low back pain 01/15/2007   Benign neoplasm of skin 11/14/2006   Cephalalgia 02/10/2006   Morbid obesity with BMI of 45.0-49.9, adult ([place]) 02/10/2006    Past Surgical History:  Procedure Laterality Date   ABDOMINAL HYSTERECTOMY     RHINOPLASTY  1982    Prior to Admission medications   Medication Sig Start Date End Date Taking? Authorizing Provider  albuterol (PROVENTIL HFA;VENTOLIN HFA) 108 (90 Base) MCG/ACT inhaler Inhale 2 puffs into the lungs every 4 (four) hours as needed for wheezing or shortness of breath (cough). 10/04/16   Karamalegos, Devonne Doughty, DO  brompheniramine-pseudoephedrine-DM 30-2-10 MG/5ML syrup Take 5 mLs by mouth 4 (four) times daily as needed. 08/20/18   Karamalegos, Devonne Doughty, DO  clotrimazole-betamethasone (LOTRISONE) cream APPLY TOPICALLY TO GENITAL AREA FOR VAGINAL ITCHING AND IRRITATION FOR UP TO 2 WEEKS, THEN STOP. 07/24/18   Karamalegos, Devonne Doughty, DO  cyclobenzaprine (FLEXERIL) 10 MG tablet Take 1 tablet by mouth three times daily as needed for muscle spasm 07/24/18   Olin Hauser, DO  doxycycline (VIBRA-TABS) 100 MG tablet Take 1 tablet (100 mg total) by mouth 2 (two) times daily. For 10 days. Take with full glass of water, stay upright 30 min after taking. 08/20/18   Karamalegos, Devonne Doughty, DO  fluticasone (FLONASE) 50 MCG/ACT nasal spray Place 2 sprays into both nostrils daily. Use for 4-6 weeks then stop and use seasonally or as needed. 07/24/18   Olin Hauser, DO  furosemide (LASIX) 20 MG tablet Take 1-2 tablets (20-40 mg total) by mouth daily as needed for edema. Use for short term only up to 3-5 days then stop, and may repeat 08/20/18   Olin Hauser, DO  hydrochlorothiazide (HYDRODIURIL) 25 MG tablet Take 1 tablet by mouth once daily 07/24/18   Parks Ranger, Devonne Doughty, DO  hydrocortisone 2.5 % cream APPLY  CREAM TO AFFECTED AREA TWICE DAILY AS NEEDED ECZEMA 02/07/18   Parks Ranger,  Devonne Doughty, DO  ibuprofen (ADVIL) 800 MG tablet TAKE 1 TABLET BY MOUTH THREE TIMES DAILY 07/24/18   Parks Ranger, Devonne Doughty, DO  ipratropium (ATROVENT) 0.06 % nasal spray Place 2 sprays into both nostrils 4 (four) times daily. For up to 5-7 days then stop. 07/17/18   Karamalegos, Devonne Doughty, DO  ketoconazole (NIZORAL) 2 % cream APPLY TOPICALLY DAILY AS NEEDED FOR IRRITATION 02/07/18   Parks Ranger, Devonne Doughty, DO  lisinopril (ZESTRIL) 20 MG tablet Take 1 tablet by mouth once daily 07/24/18   Parks Ranger, Devonne Doughty, DO  nystatin (MYCOSTATIN/NYSTOP) powder Apply topically 3 (three) times daily. Under breasts for 1-2 weeks or until healed 08/22/17   Olin Hauser, DO  orphenadrine (NORFLEX) 100 MG tablet TAKE 1 TABLET BY MOUTH TWICE DAILY AS NEEDED FOR MUSCLE SPASM 07/24/18   Parks Ranger, Devonne Doughty, DO  triamcinolone cream (KENALOG) 0.5 % Apply 1 application topically 2 (two) times daily. To affected areas, for up to 2 weeks. 08/20/18   Karamalegos, Devonne Doughty, DO  Vitamin D, Ergocalciferol, (DRISDOL) 1.25 MG (50000 UT) CAPS capsule Take 1 capsule (50,000 Units total) by mouth once a week. For 12 weeks. Then start OTC Vitamin D3 2,000 unit daily. 02/08/18   Olin Hauser, DO    Allergies Patient has no known allergies.  Family History  Problem Relation Age of Onset   Stomach cancer Father    Kidney cancer Father    Diabetes Maternal Grandmother    Stroke Maternal Grandmother    Stroke Maternal Grandfather    Heart disease Paternal Grandmother    Stroke Paternal Grandfather    Diabetes Mother    Alcohol abuse Brother    Breast cancer Paternal Aunt    Lung cancer Paternal Aunt    Bladder Cancer Neg Hx    Prostate cancer Neg Hx     Social History Social History   Tobacco Use   Smoking status: Never Smoker   Smokeless tobacco: Never Used  Substance Use Topics   Alcohol use: Yes    Alcohol/week: 0.0 standard drinks    Comment: rarely   Drug use:  No    Review of Systems  Constitutional: Negative for fever. Eyes: Negative for visual changes. ENT: Negative for sore throat. Neck: No neck pain  Cardiovascular: Negative for chest pain. Respiratory: Negative for shortness of breath. Gastrointestinal: Negative for abdominal pain, vomiting or diarrhea. Genitourinary: Negative for dysuria. Musculoskeletal: Negative for back pain. + b/l leg swelling, redness Skin: Negative for rash. Neurological: Negative for headaches, weakness or numbness. Psych: No SI or HI  ____________________________________________   PHYSICAL EXAM:  VITAL SIGNS: ED Triage Vitals  Enc Vitals Group     BP 08/29/18 2333 (!) 152/82     Pulse Rate 08/29/18 2333 87     Resp 08/29/18 2333 18     Temp 08/29/18 2333 97.9 F (36.6 C)     Temp Source 08/29/18 2333 Oral     SpO2 08/29/18 2333 100 %     Weight 08/29/18 2328 (!) 305 lb (  138.3 kg)     Height 08/29/18 2328 5\' 5"  (1.651 m)     Head Circumference --      Peak Flow --      Pain Score 08/29/18 2328 10     Pain Loc --      Pain Edu? --      Excl. in [place]? --     Constitutional: Alert and oriented. Well appearing and in no apparent distress. HEENT:      Head: Normocephalic and atraumatic.         Eyes: Conjunctivae are normal. Sclera is non-icteric.       Mouth/Throat: Mucous membranes are moist.       Neck: Supple with no signs of meningismus. Cardiovascular: Regular rate and rhythm. No murmurs, gallops, or rubs. 2+ symmetrical distal pulses are present in all extremities. No JVD. Respiratory: Normal respiratory effort. Lungs are clear to auscultation bilaterally. No wheezes, crackles, or rhonchi.  Gastrointestinal: Soft, non tender, and non distended with positive bowel sounds. No rebound or guarding. Musculoskeletal: Non pitting swelling of b/l R>L with overlying warmth and erythema R>L. Strong distal pulses bilaterally and good cap refill Neurologic: Normal speech and language. Face is  symmetric. Moving all extremities. No gross focal neurologic deficits are appreciated. Skin: Skin is warm, dry and intact. No rash noted. Psychiatric: Mood and affect are normal. Speech and behavior are normal.  ____________________________________________   LABS (all labs ordered are listed, but only abnormal results are displayed)  Labs Reviewed  CBC WITH DIFFERENTIAL/PLATELET - Abnormal; Notable for the following components:      Result Value   Eosinophils Absolute 0.6 (*)    All other components within normal limits  BASIC METABOLIC PANEL - Abnormal; Notable for the following components:   Potassium 3.4 (*)    BUN 29 (*)    All other components within normal limits  BRAIN NATRIURETIC PEPTIDE   ____________________________________________  EKG  none ____________________________________________  RADIOLOGY  I have personally reviewed the images performed during this visit and I agree with the Radiologist's read.  Doppler US: PND   ____________________________________________   PROCEDURES  Procedure(s) performed: None Procedures Critical Care performed:  None ____________________________________________   INITIAL IMPRESSION / ASSESSMENT AND PLAN / ED COURSE  58 y.o. female with history of morbidly obesity, hypertension, depression who presents for evaluation of leg redness and swelling R>L.  Patient has asymmetric swelling with right greater than left, erythema and warmth of both legs.  No pitting edema.  No respiratory symptoms.  Differential diagnosis including cellulitis versus DVT versus venous stasis versus heart failure.  Patient with normal work of breathing, normal sats, clear lungs.  Will repeat Doppler studies to rule out DVT.  Will check basic labs to rule out sepsis.  According to patient she is currently on doxycycline.  Will give a dose of IV Rocephin.  If labs are within normal limits plan to discharge home on [place] and follow-up with PCP.  Discussed leg  elevation and compression stockings.       As part of my medical decision making, I reviewed the following data within the [place] notes reviewed and incorporated, Labs reviewed , Old chart reviewed, Radiograph reviewed , Notes from prior ED visits and [place] Controlled Substance Database   Patient was evaluated in Emergency Department today for the symptoms described in the history of present illness. Patient was evaluated in the context of the global COVID-19 pandemic, which necessitated consideration that the patient might  be at risk for infection with the SARS-CoV-2 virus that causes COVID-19. Institutional protocols and algorithms that pertain to the evaluation of patients at risk for COVID-19 are in a state of rapid change based on information released by regulatory bodies including the CDC and federal and state organizations. These policies and algorithms were followed during the patient's care in the ED.   ____________________________________________   FINAL CLINICAL IMPRESSION(S) / ED DIAGNOSES   Final diagnoses:  Leg swelling  Cellulitis of lower extremity, unspecified laterality      NEW MEDICATIONS STARTED DURING THIS VISIT:  ED Discharge Orders    None       Note:  This document was prepared using Dragon voice recognition software and may include unintentional dictation errors.    Rudene Re, MD 08/30/18 [phone]

## 2018-08-30 NOTE — ED Notes (Signed)
US in with pt. 

## 2018-08-30 NOTE — ED Notes (Signed)
Pt requesting to take one of her flexeril. Advised pt to allow me to have MD approve due to other meds being given.

## 2018-08-30 NOTE — ED Provider Notes (Signed)
Both legs are negative for DVT according to radiology.  She is cleared for outpatient follow-up.   Earleen Newport, MD 08/30/18 [phone]

## 2018-08-30 NOTE — ED Notes (Signed)
Pt to the ER for a rash to the right lower leg that has spread to the left leg. Pt seen here and PCP for same. Pt started on lasix to aid in gross lower leg edema. Pt has venous stasis dermatitis. Pt denies hx of CHF. Red pinpoint rash over the legs. Pt has been given a cream but states it is not helping and pruritis is worsening.

## 2018-09-03 ENCOUNTER — Encounter (INDEPENDENT_AMBULATORY_CARE_PROVIDER_SITE_OTHER): Payer: Self-pay | Admitting: Vascular Surgery

## 2018-09-03 ENCOUNTER — Ambulatory Visit (INDEPENDENT_AMBULATORY_CARE_PROVIDER_SITE_OTHER): Payer: 59 | Admitting: Vascular Surgery

## 2018-09-03 ENCOUNTER — Other Ambulatory Visit: Payer: Self-pay

## 2018-09-03 VITALS — BP 176/75 | HR 98 | Resp 16 | Ht 65.0 in | Wt 304.0 lb

## 2018-09-03 DIAGNOSIS — I872 Venous insufficiency (chronic) (peripheral): Secondary | ICD-10-CM | POA: Diagnosis not present

## 2018-09-03 DIAGNOSIS — K219 Gastro-esophageal reflux disease without esophagitis: Secondary | ICD-10-CM

## 2018-09-03 DIAGNOSIS — I890 Lymphedema, not elsewhere classified: Secondary | ICD-10-CM | POA: Diagnosis not present

## 2018-09-03 DIAGNOSIS — I10 Essential (primary) hypertension: Secondary | ICD-10-CM | POA: Diagnosis not present

## 2018-09-03 NOTE — Progress Notes (Signed)
MRN : [id number]  Shannon Small is a 58 y.o. (09-14-1960) female who presents with chief complaint of  Chief Complaint  Patient presents with  . New Patient (Initial Visit)    ref Neoma Laming for led edema  .  History of Present Illness:   Patient is seen for evaluation of leg pain and leg swelling. The patient first noticed the swelling remotely. The swelling is associated with pain and discoloration. The pain and swelling worsens with prolonged dependency and improves with elevation. The pain is unrelated to activity.  The patient notes that in the morning the legs are significantly improved but they steadily worsened throughout the course of the day. The patient also notes a steady worsening of the discoloration in the ankle and shin area.   The patient denies claudication symptoms.  The patient denies symptoms consistent with rest pain.  The patient denies and extensive history of DJD and LS spine disease.  The patient has no had any past angiography, interventions or vascular surgery.  Elevation makes the leg symptoms better, dependency makes them much worse. There is no history of ulcerations. The patient denies any recent changes in medications.  The patient has not been wearing graduated compression.  The patient denies a history of DVT or PE. There is no prior history of phlebitis. There is no history of primary lymphedema.  No history of malignancies. No history of trauma or groin or pelvic surgery. There is no history of radiation treatment to the groin or pelvis  The patient denies amaurosis fugax or recent TIA symptoms. There are no recent neurological changes noted. The patient denies recent episodes of angina or shortness of breath  Current Meds  Medication Sig  . albuterol (PROVENTIL HFA;VENTOLIN HFA) 108 (90 Base) MCG/ACT inhaler Inhale 2 puffs into the lungs every 4 (four) hours as needed for wheezing or shortness of breath (cough).  .  brompheniramine-pseudoephedrine-DM 30-2-10 MG/5ML syrup Take 5 mLs by mouth 4 (four) times daily as needed.  . clindamycin (CLEOCIN) 300 MG capsule Take 1 capsule (300 mg total) by mouth 3 (three) times daily.  . clotrimazole-betamethasone (LOTRISONE) cream APPLY TOPICALLY TO GENITAL AREA FOR VAGINAL ITCHING AND IRRITATION FOR UP TO 2 WEEKS, THEN STOP.  Marland Kitchen cyclobenzaprine (FLEXERIL) 10 MG tablet Take 1 tablet by mouth three times daily as needed for muscle spasm  . diphenhydrAMINE-zinc acetate (BENADRYL EXTRA STRENGTH) cream Apply 1 application topically 3 (three) times daily as needed for itching.  . fluticasone (FLONASE) 50 MCG/ACT nasal spray Place 2 sprays into both nostrils daily. Use for 4-6 weeks then stop and use seasonally or as needed.  . furosemide (LASIX) 20 MG tablet Take 1-2 tablets (20-40 mg total) by mouth daily as needed for edema. Use for short term only up to 3-5 days then stop, and may repeat  . hydrochlorothiazide (HYDRODIURIL) 25 MG tablet Take 1 tablet by mouth once daily  . hydrocortisone 2.5 % cream APPLY  CREAM TO AFFECTED AREA TWICE DAILY AS NEEDED ECZEMA  . ibuprofen (ADVIL) 800 MG tablet TAKE 1 TABLET BY MOUTH THREE TIMES DAILY  . ipratropium (ATROVENT) 0.06 % nasal spray Place 2 sprays into both nostrils 4 (four) times daily. For up to 5-7 days then stop.  Marland Kitchen ketoconazole (NIZORAL) 2 % cream APPLY TOPICALLY DAILY AS NEEDED FOR IRRITATION  . lisinopril (ZESTRIL) 20 MG tablet Take 1 tablet by mouth once daily  . nystatin (MYCOSTATIN/NYSTOP) powder Apply topically 3 (three) times daily. Under breasts for 1-2 weeks  or until healed  . orphenadrine (NORFLEX) 100 MG tablet TAKE 1 TABLET BY MOUTH TWICE DAILY AS NEEDED FOR MUSCLE SPASM  . triamcinolone cream (KENALOG) 0.5 % Apply 1 application topically 2 (two) times daily. To affected areas, for up to 2 weeks.  . Vitamin D, Ergocalciferol, (DRISDOL) 1.25 MG (50000 UT) CAPS capsule Take 1 capsule (50,000 Units total) by mouth once  a week. For 12 weeks. Then start OTC Vitamin D3 2,000 unit daily.    Past Medical History:  Diagnosis Date  . Acid reflux 09/09/2014  . Adjustment disorder with depressed mood 11/14/2006   ASSESSMENT: Bereavement. Recommend counseling with pastor or psychologist.   . Benign neoplasm of skin 11/14/2006  . Cephalalgia 02/10/2006  . Chronic infection of sinus 09/09/2014  . Extreme obesity 02/10/2006  . LBP (low back pain) 01/15/2007   Chronic back strain and spasms due to obesity.   . Sleep related leg cramps 09/09/2014    Past Surgical History:  Procedure Laterality Date  . ABDOMINAL HYSTERECTOMY    . RHINOPLASTY  1982    Social History Social History   Tobacco Use  . Smoking status: Never Smoker  . Smokeless tobacco: Never Used  Substance Use Topics  . Alcohol use: Yes    Alcohol/week: 0.0 standard drinks    Comment: rarely  . Drug use: No    Family History Family History  Problem Relation Age of Onset  . Stomach cancer Father   . Kidney cancer Father   . Diabetes Maternal Grandmother   . Stroke Maternal Grandmother   . Stroke Maternal Grandfather   . Heart disease Paternal Grandmother   . Stroke Paternal Grandfather   . Diabetes Mother   . Alcohol abuse Brother   . Breast cancer Paternal Aunt   . Lung cancer Paternal Aunt   . Bladder Cancer Neg Hx   . Prostate cancer Neg Hx   No family history of bleeding/clotting disorders, porphyria or autoimmune disease   No Known Allergies   REVIEW OF SYSTEMS (Negative unless checked)  Constitutional: [] Weight loss  [] Fever  [] Chills Cardiac: [] Chest pain   [] Chest pressure   [] Palpitations   [] Shortness of breath when laying flat   [] Shortness of breath with exertion. Vascular:  [] Pain in legs with walking   [x] Pain in legs at rest  [] History of DVT   [] Phlebitis   [x] Swelling in legs   [] Varicose veins   [] Non-healing ulcers Pulmonary:   [] Uses home oxygen   [] Productive cough   [] Hemoptysis   [] Wheeze  [] COPD   [] Asthma  Neurologic:  [] Dizziness   [] Seizures   [] History of stroke   [] History of TIA  [] Aphasia   [] Vissual changes   [] Weakness or numbness in arm   [] Weakness or numbness in leg Musculoskeletal:   [] Joint swelling   [] Joint pain   [] Low back pain Hematologic:  [] Easy bruising  [] Easy bleeding   [] Hypercoagulable state   [] Anemic Gastrointestinal:  [] Diarrhea   [] Vomiting  [] Gastroesophageal reflux/heartburn   [] Difficulty swallowing. Genitourinary:  [] Chronic kidney disease   [] Difficult urination  [] Frequent urination   [] Blood in urine Skin:  [] Rashes   [] Ulcers  Psychological:  [] History of anxiety   []  History of major depression.  Physical Examination  Vitals:   09/03/18 0947  BP: (!) 176/75  Pulse: 98  Resp: 16  Weight: (!) 304 lb (137.9 kg)  Height: 5\' 5"  (1.651 m)   Body mass index is 50.59 kg/m. Gen: WD/WN, NAD obese Head: Barrett/AT, No temporalis  wasting.  Ear/Nose/Throat: Hearing grossly intact, nares w/o erythema or drainage, poor dentition Eyes: PER, EOMI, sclera nonicteric.  Neck: Supple, no masses.  No bruit or JVD.  Pulmonary:  Good air movement, clear to auscultation bilaterally, no use of accessory muscles.  Cardiac: RRR, normal S1, S2, no Murmurs. Vascular: scattered varicosities present bilaterally.  Severe inflamed venous stasis changes to the legs bilaterally.  4+ soft pitting edema Vessel Right Left  Radial Palpable Palpable  DP Palpable Palpable  Gastrointestinal: soft, non-distended. No guarding/no peritoneal signs.  Musculoskeletal: M/S 5/5 throughout.  No deformity or atrophy.  Neurologic: CN 2-12 intact. Pain and light touch intact in extremities.  Symmetrical.  Speech is fluent. Motor exam as listed above. Psychiatric: Judgment intact, Mood & affect appropriate for pt's clinical situation. Dermatologic: severe rashes no ulcers noted.  No changes consistent with cellulitis. Lymph : No Cervical lymphadenopathy, no lichenification or skin changes of chronic  lymphedema.  CBC Lab Results  Component Value Date   WBC 8.4 08/30/2018   HGB 13.0 08/30/2018   HCT 40.4 08/30/2018   MCV 88.4 08/30/2018   PLT 230 08/30/2018    BMET    Component Value Date/Time   NA 137 08/30/2018 0636   NA 140 07/30/2013 0847   K 3.4 (L) 08/30/2018 0636   K 3.7 07/30/2013 0847   CL 103 08/30/2018 0636   CL 107 07/30/2013 0847   CO2 27 08/30/2018 0636   CO2 27 07/30/2013 0847   GLUCOSE 93 08/30/2018 0636   GLUCOSE 93 07/30/2013 0847   BUN 29 (H) 08/30/2018 0636   BUN 17 07/30/2013 0847   CREATININE 0.69 08/30/2018 0636   CREATININE 0.59 02/07/2018 0940   CALCIUM 9.2 08/30/2018 0636   CALCIUM 8.5 07/30/2013 0847   GFRNONAA >60 08/30/2018 0636   GFRNONAA 102 02/07/2018 0940   GFRAA >60 08/30/2018 0636   GFRAA 119 02/07/2018 0940   Estimated Creatinine Clearance: 109.5 mL/min (by C-G formula based on SCr of 0.69 mg/dL).  COAG No results found for: INR, PROTIME  Radiology US Venous Img Lower Bilateral  Result Date: 08/30/2018 CLINICAL DATA:  58 year old female with bilateral lower extremity edema. EXAM: BILATERAL LOWER EXTREMITY VENOUS DOPPLER ULTRASOUND TECHNIQUE: Gray-scale sonography with graded compression, as well as color Doppler and duplex ultrasound were performed to evaluate the lower extremity deep venous systems from the level of the common femoral vein and including the common femoral, femoral, profunda femoral, popliteal and calf veins including the posterior tibial, peroneal and gastrocnemius veins when visible. The superficial great saphenous vein was also interrogated. Spectral Doppler was utilized to evaluate flow at rest and with distal augmentation maneuvers in the common femoral, femoral and popliteal veins. COMPARISON:  None. FINDINGS: RIGHT LOWER EXTREMITY Common Femoral Vein: No evidence of thrombus. Normal compressibility, respiratory phasicity and response to augmentation. Saphenofemoral Junction: No evidence of thrombus. Normal  compressibility and flow on color Doppler imaging. Profunda Femoral Vein: No evidence of thrombus. Normal compressibility and flow on color Doppler imaging. Femoral Vein: No evidence of thrombus. Normal compressibility, respiratory phasicity and response to augmentation. Popliteal Vein: No evidence of thrombus. Normal compressibility, respiratory phasicity and response to augmentation. Calf Veins: No evidence of thrombus. Normal compressibility and flow on color Doppler imaging. Superficial Great Saphenous Vein: No evidence of thrombus. Normal compressibility. Venous Reflux:  None. Other Findings:  None. LEFT LOWER EXTREMITY Common Femoral Vein: No evidence of thrombus. Normal compressibility, respiratory phasicity and response to augmentation. Saphenofemoral Junction: No evidence of thrombus. Normal compressibility and  flow on color Doppler imaging. Profunda Femoral Vein: No evidence of thrombus. Normal compressibility and flow on color Doppler imaging. Femoral Vein: No evidence of thrombus. Normal compressibility, respiratory phasicity and response to augmentation. Popliteal Vein: No evidence of thrombus. Normal compressibility, respiratory phasicity and response to augmentation. Calf Veins: No evidence of thrombus. Normal compressibility and flow on color Doppler imaging. Superficial Great Saphenous Vein: No evidence of thrombus. Normal compressibility. Venous Reflux:  None. Other Findings:  None. IMPRESSION: No evidence of deep venous thrombosis. Electronically Signed   By: Jacqulynn Cadet M.D.   On: 08/30/2018 08:50     Assessment/Plan 1. Lymphedema I have had a long discussion with the patient regarding swelling and why it  causes symptoms.  Patient will begin wearing graduated compression stockings class 1 (20-30 mmHg) on a daily basis a prescription was given. The patient will  beginning wearing the stockings first thing in the morning and removing them in the evening. The patient is instructed  specifically not to sleep in the stockings.   In addition, behavioral modification will be initiated.  This will include frequent elevation, use of over the counter pain medications and exercise such as walking.  I have reviewed systemic causes for chronic edema such as liver, kidney and cardiac etiologies.  The patient denies problems with these organ systems.    Consideration for a lymph pump will also be made based upon the effectiveness of conservative therapy.  This would help to improve the edema control and prevent sequela such as ulcers and infections   Patient should undergo duplex ultrasound of the venous system to ensure that DVT or reflux is not present.  The patient will follow-up with me after the ultrasound.   - VAS [place] LOWER EXTREMITY VENOUS REFLUX; Future  2. Chronic venous insufficiency No surgery or intervention at this point in time.    I have had a long discussion with the patient regarding venous insufficiency and why it  causes symptoms. I have discussed with the patient the chronic skin changes that accompany venous insufficiency and the long term sequela such as infection and ulceration.  Patient will begin wearing graduated compression stockings class 1 (20-30 mmHg) or compression wraps on a daily basis a prescription was given. The patient will put the stockings on first thing in the morning and removing them in the evening. The patient is instructed specifically not to sleep in the stockings.    In addition, behavioral modification including several periods of elevation of the lower extremities during the day will be continued. I have demonstrated that proper elevation is a position with the ankles at heart level.  The patient is instructed to begin routine exercise, especially walking on a daily basis  Patient should undergo duplex ultrasound of the venous system to ensure that DVT or reflux is not present.  Following the review of the ultrasound the patient will  follow up in 2-3 months to reassess the degree of swelling and the control that graduated compression stockings or compression wraps  is offering.   The patient can be assessed for a Lymph Pump at that time - VAS [place] LOWER EXTREMITY VENOUS REFLUX; Future  3. Essential hypertension Continue antihypertensive medications as already ordered, these medications have been reviewed and there are no changes at this time.   4. Gastroesophageal reflux disease without esophagitis Continue PPI as already ordered, this medication has been reviewed and there are no changes at this time.  Avoidence of caffeine and alcohol  Moderate  elevation of the head of the bed    Hortencia Pilar, MD  09/03/2018 10:08 AM

## 2018-09-07 ENCOUNTER — Telehealth: Payer: Self-pay | Admitting: Family Medicine

## 2018-09-07 DIAGNOSIS — L299 Pruritus, unspecified: Secondary | ICD-10-CM

## 2018-09-07 MED ORDER — HYDROXYZINE HCL 25 MG PO TABS: 25.0000 mg | ORAL_TABLET | Freq: Three times a day (TID) | ORAL | 0 refills | Status: DC | PRN

## 2018-09-07 NOTE — Telephone Encounter (Signed)
Patient advised.

## 2018-09-07 NOTE — Telephone Encounter (Signed)
New rx hydroxyzine sent to pharmacy take as needed for itching.  Also she already has Triamcinolone Kenalog Cream - she can use this as needed for itching skin / rash.  [name], DO [facility] 09/07/2018, 1:03 PM

## 2018-09-07 NOTE — Telephone Encounter (Signed)
As per patient she is itchy all over and Benadryl is not helping she felt itchiness is from under the skin. Please suggest ?

## 2018-09-18 ENCOUNTER — Other Ambulatory Visit: Payer: Self-pay

## 2018-09-18 ENCOUNTER — Telehealth: Payer: Self-pay

## 2018-09-18 DIAGNOSIS — L299 Pruritus, unspecified: Secondary | ICD-10-CM

## 2018-09-18 MED ORDER — HYDROXYZINE HCL 25 MG PO TABS: 25.0000 mg | ORAL_TABLET | Freq: Three times a day (TID) | ORAL | 0 refills | Status: DC | PRN

## 2018-09-18 NOTE — Telephone Encounter (Signed)
Patient called stating she is in need for her itching.  She states she a dermatology appointment soon.

## 2018-09-19 ENCOUNTER — Telehealth: Payer: Self-pay | Admitting: Family Medicine

## 2018-09-19 ENCOUNTER — Other Ambulatory Visit: Payer: Self-pay | Admitting: Family Medicine

## 2018-09-19 DIAGNOSIS — I872 Venous insufficiency (chronic) (peripheral): Secondary | ICD-10-CM

## 2018-09-19 DIAGNOSIS — S39012A Strain of muscle, fascia and tendon of lower back, initial encounter: Secondary | ICD-10-CM

## 2018-09-19 DIAGNOSIS — I10 Essential (primary) hypertension: Secondary | ICD-10-CM

## 2018-09-19 DIAGNOSIS — M62830 Muscle spasm of back: Secondary | ICD-10-CM

## 2018-09-19 MED ORDER — ORPHENADRINE CITRATE ER 100 MG PO TB12: 100.0000 mg | ORAL_TABLET | Freq: Two times a day (BID) | ORAL | 1 refills | Status: DC | PRN

## 2018-09-19 NOTE — Telephone Encounter (Signed)
She should have refills on most of her chronic meds, she needs to check with pharmacy. I am not quite sure what she needs and do not routinely refill all meds at once in a phone call.  Also - she has been on a variety of creams. She may need a Dermatologist if overall she does not improve - we have tried topical steroid Triamcinolone, and mixture topical steroid/fungal with Lotrisone, and also Nystatin powder, and Ketoconazole cream.  Let me know which cream she needs, and she will need visit for other medication refills, and if she wants to pursue Dermatology as well.  [name], DO [facility] 09/19/2018, 11:17 AM

## 2018-09-19 NOTE — Telephone Encounter (Signed)
Patient called and I filled her Hydroxine yesterday and asked if she wanted cream and she said no the itch pill. She is now calling stating she wants all her meds refilled. I do not see any visits for all of these only see sick visits and maybe 6 mo ago a visit about Levequin. Pls refill the meds you are okay filling or advise me on what to tell patient.

## 2018-09-19 NOTE — Telephone Encounter (Signed)
I went through her list and her only chronic long term medications are as follows:  1. Hydrochlorothiazide 25mg  dailiy 2. Lisinopril 20mg  dailiy 3. Norflex (orphenadrine) 4. Flonase  Furosemide Lasix (As needed - and this was only for a short term course)  Rest of her medication are creams.  Her medications listed above HCTZ, Lisiniopril and Norflex were all ordered for 90 day supply back on 07/24/18.  She should not be out of medicine at this time.  I can send refill for one more 90 day supply with a refill so that would take her to her yearly visit in February 2021, otherwise she needs to follow-up at that time or else we cannot continue these medications.  [name], DO [facility] 09/19/2018, 1:04 PM

## 2018-09-19 NOTE — Telephone Encounter (Signed)
This refill for hydroxine she wanted was sent in yesterday to [facility] and I see Receipt but I called to notify her and no anser or VM. If she calls let her know it has been sent in please.

## 2018-09-19 NOTE — Telephone Encounter (Signed)
Patient advised and she screamed at me over the phone because I told her that she needs to be seen to get all her refills. She said she came 6 weeks ago and yelled that she has no damn money to keep on coming to the doctor. She asked to speak to doctor and I said he is in with patients. I offered a VV and she screamed for me to tell the doctor she can not come and he needs to refill her medications. I tried to explain that we can not only see her for sick visits and refill all her meds through phone calls but she refused to allow me to speak. Patient demanding that we refill meds and refusing to work out any option other than all meds be sent in by PCP. She was advised that I will give PCP her message that she can not be seen due to money and is expecting meds be called in until she gets a job.

## 2018-09-19 NOTE — Telephone Encounter (Signed)
Patient advised.

## 2018-09-19 NOTE — Telephone Encounter (Signed)
Pt. called requesting refill on all her medication called into [facility]  [address]  [address]

## 2018-11-01 ENCOUNTER — Encounter (INDEPENDENT_AMBULATORY_CARE_PROVIDER_SITE_OTHER): Payer: Self-pay

## 2018-11-01 ENCOUNTER — Ambulatory Visit (INDEPENDENT_AMBULATORY_CARE_PROVIDER_SITE_OTHER): Payer: 59 | Admitting: Vascular Surgery

## 2018-11-01 ENCOUNTER — Encounter (INDEPENDENT_AMBULATORY_CARE_PROVIDER_SITE_OTHER): Payer: Self-pay | Admitting: Vascular Surgery

## 2018-11-01 ENCOUNTER — Other Ambulatory Visit: Payer: Self-pay

## 2018-11-01 ENCOUNTER — Ambulatory Visit (INDEPENDENT_AMBULATORY_CARE_PROVIDER_SITE_OTHER): Payer: 59

## 2018-11-01 VITALS — BP 171/90 | HR 85 | Resp 14 | Ht 65.0 in | Wt 309.0 lb

## 2018-11-01 DIAGNOSIS — K219 Gastro-esophageal reflux disease without esophagitis: Secondary | ICD-10-CM

## 2018-11-01 DIAGNOSIS — I872 Venous insufficiency (chronic) (peripheral): Secondary | ICD-10-CM

## 2018-11-01 DIAGNOSIS — M170 Bilateral primary osteoarthritis of knee: Secondary | ICD-10-CM

## 2018-11-01 DIAGNOSIS — I890 Lymphedema, not elsewhere classified: Secondary | ICD-10-CM | POA: Diagnosis not present

## 2018-11-01 DIAGNOSIS — I10 Essential (primary) hypertension: Secondary | ICD-10-CM | POA: Diagnosis not present

## 2018-11-01 NOTE — Progress Notes (Signed)
MRN : [id number]  Shannon Small is a 58 y.o. (Nov 16, 1960) female who presents with chief complaint of  Chief Complaint  Patient presents with  . Follow-up  .  History of Present Illness:   The patient returns to the office for followup evaluation regarding leg swelling.  The swelling has persisted and the pain associated with swelling continues. There have not been any interval development of a ulcerations or wounds.  Since the previous visit the patient has been wearing graduated compression stockings and has noted little if any improvement in the lymphedema. The patient has been using compression routinely morning until night.  The patient also states elevation during the day and exercise is being done too.   Current Meds  Medication Sig  . albuterol (PROVENTIL HFA;VENTOLIN HFA) 108 (90 Base) MCG/ACT inhaler Inhale 2 puffs into the lungs every 4 (four) hours as needed for wheezing or shortness of breath (cough).  . clotrimazole-betamethasone (LOTRISONE) cream APPLY TOPICALLY TO GENITAL AREA FOR VAGINAL ITCHING AND IRRITATION FOR UP TO 2 WEEKS, THEN STOP.  Marland Kitchen cyclobenzaprine (FLEXERIL) 10 MG tablet Take 1 tablet by mouth three times daily as needed for muscle spasm  . diphenhydrAMINE-zinc acetate (BENADRYL EXTRA STRENGTH) cream Apply 1 application topically 3 (three) times daily as needed for itching.  . fluticasone (FLONASE) 50 MCG/ACT nasal spray Place 2 sprays into both nostrils daily. Use for 4-6 weeks then stop and use seasonally or as needed.  . furosemide (LASIX) 20 MG tablet TAKE 1 TO 2 TABLETS BY MOUTH ONCE DAILY AS NEEDED FOR EDEMA. USE FOR SHORT TERM ONLY UP TO 3 TO 5 DAYS THEN STOP, AND MAY REPEAT  . hydrochlorothiazide (HYDRODIURIL) 25 MG tablet Take 1 tablet by mouth once daily  . hydrocortisone 2.5 % cream APPLY  CREAM TO AFFECTED AREA TWICE DAILY AS NEEDED ECZEMA  . ibuprofen (ADVIL) 800 MG tablet Take 1 tablet (800 mg total) by mouth every 8 (eight) hours as needed.   Marland Kitchen ketoconazole (NIZORAL) 2 % cream APPLY TOPICALLY DAILY AS NEEDED FOR IRRITATION  . lisinopril (ZESTRIL) 20 MG tablet Take 1 tablet by mouth once daily  . nystatin (MYCOSTATIN/NYSTOP) powder Apply topically 3 (three) times daily. Under breasts for 1-2 weeks or until healed  . orphenadrine (NORFLEX) 100 MG tablet Take 1 tablet (100 mg total) by mouth 2 (two) times daily as needed for muscle spasms.  Marland Kitchen triamcinolone cream (KENALOG) 0.5 % APPLY TOPICALLY TWICE DAILY FOR UP TO 2 WEEKS    Past Medical History:  Diagnosis Date  . Acid reflux 09/09/2014  . Adjustment disorder with depressed mood 11/14/2006   ASSESSMENT: Bereavement. Recommend counseling with pastor or psychologist.   . Benign neoplasm of skin 11/14/2006  . Cephalalgia 02/10/2006  . Chronic infection of sinus 09/09/2014  . Extreme obesity 02/10/2006  . LBP (low back pain) 01/15/2007   Chronic back strain and spasms due to obesity.   . Sleep related leg cramps 09/09/2014    Past Surgical History:  Procedure Laterality Date  . ABDOMINAL HYSTERECTOMY    . RHINOPLASTY  1982    Social History Social History   Tobacco Use  . Smoking status: Never Smoker  . Smokeless tobacco: Never Used  Substance Use Topics  . Alcohol use: Yes    Alcohol/week: 0.0 standard drinks    Comment: rarely  . Drug use: No    Family History Family History  Problem Relation Age of Onset  . Stomach cancer Father   . Kidney cancer  Father   . Diabetes Maternal Grandmother   . Stroke Maternal Grandmother   . Stroke Maternal Grandfather   . Heart disease Paternal Grandmother   . Stroke Paternal Grandfather   . Diabetes Mother   . Alcohol abuse Brother   . Breast cancer Paternal Aunt   . Lung cancer Paternal Aunt   . Bladder Cancer Neg Hx   . Prostate cancer Neg Hx     No Known Allergies   REVIEW OF SYSTEMS (Negative unless checked)  Constitutional: [] Weight loss  [] Fever  [] Chills Cardiac: [] Chest pain   [] Chest pressure   [] Palpitations    [] Shortness of breath when laying flat   [] Shortness of breath with exertion. Vascular:  [] Pain in legs with walking   [x] Pain in legs at rest  [] History of DVT   [] Phlebitis   [x] Swelling in legs   [] Varicose veins   [] Non-healing ulcers Pulmonary:   [] Uses home oxygen   [] Productive cough   [] Hemoptysis   [] Wheeze  [] COPD   [] Asthma Neurologic:  [] Dizziness   [] Seizures   [] History of stroke   [] History of TIA  [] Aphasia   [] Vissual changes   [] Weakness or numbness in arm   [] Weakness or numbness in leg Musculoskeletal:   [] Joint swelling   [x] Joint pain   [] Low back pain Hematologic:  [] Easy bruising  [] Easy bleeding   [] Hypercoagulable state   [] Anemic Gastrointestinal:  [] Diarrhea   [] Vomiting  [] Gastroesophageal reflux/heartburn   [] Difficulty swallowing. Genitourinary:  [] Chronic kidney disease   [] Difficult urination  [] Frequent urination   [] Blood in urine Skin:  [] Rashes   [] Ulcers  Psychological:  [] History of anxiety   []  History of major depression.  Physical Examination  Vitals:   11/01/18 1128  BP: (!) 171/90  Pulse: 85  Resp: 14  Weight: (!) 309 lb (140.2 kg)  Height: 5\' 5"  (1.651 m)   Body mass index is 51.42 kg/m. Gen: WD/WN, NAD morbid obesity Head: [place]/AT, No temporalis wasting.  Ear/Nose/Throat: Hearing grossly intact, nares w/o erythema or drainage Eyes: PER, EOMI, sclera nonicteric.  Neck: Supple, no large masses.   Pulmonary:  Good air movement, no audible wheezing bilaterally, no use of accessory muscles.  Cardiac: RRR, no JVD Vascular: scattered varicosities present bilaterally.  severe venous stasis changes to the legs bilaterally.  3+ soft pitting edema Gastrointestinal: Non-distended. No guarding/no peritoneal signs.  Musculoskeletal: M/S 5/5 throughout.  No deformity or atrophy.  Neurologic: CN 2-12 intact. Symmetrical.  Speech is fluent. Motor exam as listed above. Psychiatric: Judgment intact, Mood & affect appropriate for pt's clinical situation.  Dermatologic: No rashes or ulcers noted.  No changes consistent with cellulitis. Lymph : No lichenification or skin changes of chronic lymphedema.  CBC Lab Results  Component Value Date   WBC 8.4 08/30/2018   HGB 13.0 08/30/2018   HCT 40.4 08/30/2018   MCV 88.4 08/30/2018   PLT 230 08/30/2018    BMET    Component Value Date/Time   NA 137 08/30/2018 0636   NA 140 07/30/2013 0847   K 3.4 (L) 08/30/2018 0636   K 3.7 07/30/2013 0847   CL 103 08/30/2018 0636   CL 107 07/30/2013 0847   CO2 27 08/30/2018 0636   CO2 27 07/30/2013 0847   GLUCOSE 93 08/30/2018 0636   GLUCOSE 93 07/30/2013 0847   BUN 29 (H) 08/30/2018 0636   BUN 17 07/30/2013 0847   CREATININE 0.69 08/30/2018 0636   CREATININE 0.59 02/07/2018 0940   CALCIUM 9.2 08/30/2018 0636  CALCIUM 8.5 07/30/2013 0847   GFRNONAA >60 08/30/2018 0636   GFRNONAA 102 02/07/2018 0940   GFRAA >60 08/30/2018 0636   GFRAA 119 02/07/2018 0940   CrCl cannot be calculated (Patient's most recent lab result is older than the maximum 21 days allowed.).  COAG No results found for: INR, PROTIME  Radiology No results found.  Assessment/Plan 1. Lymphedema Recommend:  No surgery or intervention at this point in time.    I have reviewed my previous discussion with the patient regarding swelling and why it causes symptoms.  Patient will continue wearing graduated compression stockings class 1 (20-30 mmHg) on a daily basis. The patient will  beginning wearing the stockings first thing in the morning and removing them in the evening. The patient is instructed specifically not to sleep in the stockings.    In addition, behavioral modification including several periods of elevation of the lower extremities during the day will be continued.  This was reviewed with the patient during the initial visit.  The patient will also continue routine exercise, especially walking on a daily basis as was discussed during the initial visit.    Despite  conservative treatments including graduated compression therapy class 1 and behavioral modification including exercise and elevation the patient  has not obtained adequate control of the lymphedema.  The patient still has stage 3 lymphedema and therefore, I believe that a lymph pump should be added to improve the control of the patient's lymphedema.  Additionally, a lymph pump is warranted because it will reduce the risk of cellulitis and ulceration in the future.  Patient should follow-up in six months    2. Chronic venous insufficiency Recommend:  No surgery or intervention at this point in time.    I have reviewed my previous discussion with the patient regarding swelling and why it causes symptoms.  Patient will continue wearing graduated compression stockings class 1 (20-30 mmHg) on a daily basis. The patient will  beginning wearing the stockings first thing in the morning and removing them in the evening. The patient is instructed specifically not to sleep in the stockings.    In addition, behavioral modification including several periods of elevation of the lower extremities during the day will be continued.  This was reviewed with the patient during the initial visit.  The patient will also continue routine exercise, especially walking on a daily basis as was discussed during the initial visit.    Despite conservative treatments including graduated compression therapy class 1 and behavioral modification including exercise and elevation the patient  has not obtained adequate control of the lymphedema.  The patient still has stage 3 lymphedema and therefore, I believe that a lymph pump should be added to improve the control of the patient's lymphedema.  Additionally, a lymph pump is warranted because it will reduce the risk of cellulitis and ulceration in the future.  Patient should follow-up in six months    3. Essential hypertension Continue antihypertensive medications as already  ordered, these medications have been reviewed and there are no changes at this time.   4. Primary osteoarthritis of both knees Continue NSAID medications as already ordered, these medications have been reviewed and there are no changes at this time.  Continued activity and therapy was stressed.   5. Gastroesophageal reflux disease without esophagitis Continue PPI as already ordered, this medication has been reviewed and there are no changes at this time.  Avoidence of caffeine and alcohol  Moderate elevation of the head of the  bed     Hortencia Pilar, MD  11/01/2018 11:33 AM

## 2018-11-15 ENCOUNTER — Other Ambulatory Visit: Payer: Self-pay | Admitting: Family Medicine

## 2018-11-15 DIAGNOSIS — B372 Candidiasis of skin and nail: Secondary | ICD-10-CM

## 2019-01-02 ENCOUNTER — Other Ambulatory Visit: Payer: Self-pay | Admitting: Family Medicine

## 2019-01-02 DIAGNOSIS — B372 Candidiasis of skin and nail: Secondary | ICD-10-CM

## 2019-01-02 DIAGNOSIS — I872 Venous insufficiency (chronic) (peripheral): Secondary | ICD-10-CM

## 2019-01-02 NOTE — Telephone Encounter (Signed)
These appear to be short term medicines. Will leave for Dr. Raliegh Ip

## 2019-01-09 ENCOUNTER — Other Ambulatory Visit: Payer: Self-pay | Admitting: Family Medicine

## 2019-01-09 DIAGNOSIS — B372 Candidiasis of skin and nail: Secondary | ICD-10-CM

## 2019-01-09 DIAGNOSIS — I872 Venous insufficiency (chronic) (peripheral): Secondary | ICD-10-CM

## 2019-01-09 MED ORDER — FUROSEMIDE 20 MG PO TABS: ORAL_TABLET | ORAL | 0 refills | Status: DC

## 2019-01-09 MED ORDER — KETOCONAZOLE 2 % EX CREA: TOPICAL_CREAM | Freq: Every day | CUTANEOUS | 2 refills | Status: DC | PRN

## 2019-01-09 NOTE — Addendum Note (Signed)
Addended by: Olin Hauser on: 01/09/2019 05:03 PM   Modules accepted: Orders

## 2019-01-16 ENCOUNTER — Other Ambulatory Visit: Payer: Self-pay

## 2019-01-16 DIAGNOSIS — R110 Nausea: Secondary | ICD-10-CM | POA: Diagnosis not present

## 2019-01-16 DIAGNOSIS — K859 Acute pancreatitis without necrosis or infection, unspecified: Secondary | ICD-10-CM | POA: Diagnosis not present

## 2019-01-16 DIAGNOSIS — I10 Essential (primary) hypertension: Secondary | ICD-10-CM | POA: Insufficient documentation

## 2019-01-16 DIAGNOSIS — Z79899 Other long term (current) drug therapy: Secondary | ICD-10-CM | POA: Insufficient documentation

## 2019-01-16 DIAGNOSIS — R1011 Right upper quadrant pain: Secondary | ICD-10-CM | POA: Diagnosis present

## 2019-01-16 DIAGNOSIS — K219 Gastro-esophageal reflux disease without esophagitis: Secondary | ICD-10-CM | POA: Insufficient documentation

## 2019-01-16 DIAGNOSIS — K5900 Constipation, unspecified: Secondary | ICD-10-CM | POA: Insufficient documentation

## 2019-01-16 NOTE — ED Notes (Signed)
Pt ambulatory to the restroom.  

## 2019-01-17 ENCOUNTER — Emergency Department: Payer: 59

## 2019-01-17 ENCOUNTER — Emergency Department
Admission: EM | Admit: 2019-01-17 | Discharge: 2019-01-17 | Disposition: A | Discharge status: Home / Self Care | Payer: 59 | Attending: Emergency Medicine | Admitting: Emergency Medicine

## 2019-01-17 ENCOUNTER — Encounter: Payer: Self-pay | Admitting: *Deleted

## 2019-01-17 DIAGNOSIS — K859 Acute pancreatitis without necrosis or infection, unspecified: Secondary | ICD-10-CM

## 2019-01-17 DIAGNOSIS — K5900 Constipation, unspecified: Secondary | ICD-10-CM

## 2019-01-17 DIAGNOSIS — R1011 Right upper quadrant pain: Secondary | ICD-10-CM

## 2019-01-17 LAB — URINALYSIS, COMPLETE (UACMP) WITH MICROSCOPIC
Bilirubin Urine: NEGATIVE
Glucose, UA: NEGATIVE mg/dL
Ketones, ur: 20 mg/dL — AB
Leukocytes,Ua: NEGATIVE
Nitrite: NEGATIVE
Protein, ur: NEGATIVE mg/dL
Specific Gravity, Urine: 1.012 (ref 1.005–1.030)
pH: 6 (ref 5.0–8.0)

## 2019-01-17 LAB — CBC
HCT: 40.4 % (ref 36.0–46.0)
Hemoglobin: 13.2 g/dL (ref 12.0–15.0)
MCH: 28 pg (ref 26.0–34.0)
MCHC: 32.7 g/dL (ref 30.0–36.0)
MCV: 85.8 fL (ref 80.0–100.0)
Platelets: 289 10*3/uL (ref 150–400)
RBC: 4.71 MIL/uL (ref 3.87–5.11)
RDW: 13 % (ref 11.5–15.5)
WBC: 16 10*3/uL — ABNORMAL HIGH (ref 4.0–10.5)
nRBC: 0 % (ref 0.0–0.2)

## 2019-01-17 LAB — COMPREHENSIVE METABOLIC PANEL
ALT: 11 U/L (ref 0–44)
AST: 14 U/L — ABNORMAL LOW (ref 15–41)
Albumin: 4.1 g/dL (ref 3.5–5.0)
Alkaline Phosphatase: 71 U/L (ref 38–126)
Anion gap: 12 (ref 5–15)
BUN: 16 mg/dL (ref 6–20)
CO2: 22 mmol/L (ref 22–32)
Calcium: 9.8 mg/dL (ref 8.9–10.3)
Chloride: 103 mmol/L (ref 98–111)
Creatinine, Ser: 0.65 mg/dL (ref 0.44–1.00)
GFR calc Af Amer: >60 mL/min (ref >60)
GFR calc non Af Amer: >60 mL/min (ref >60)
Glucose, Bld: 118 mg/dL — ABNORMAL HIGH (ref 70–99)
Potassium: 3.6 mmol/L (ref 3.5–5.1)
Sodium: 137 mmol/L (ref 135–145)
Total Bilirubin: 0.6 mg/dL (ref 0.3–1.2)
Total Protein: 7.7 g/dL (ref 6.5–8.1)

## 2019-01-17 LAB — LACTATE DEHYDROGENASE: LDH: 128 U/L (ref 98–192)

## 2019-01-17 LAB — LIPASE, BLOOD: Lipase: 268 U/L — ABNORMAL HIGH (ref 11–51)

## 2019-01-17 MED ORDER — OXYCODONE HCL 5 MG PO TABS
5.0000 mg | ORAL_TABLET | Freq: Once | ORAL | Status: AC
  Administered 2019-01-17: 05:00:00 5 mg via ORAL
  Filled 2019-01-17: qty 1

## 2019-01-17 MED ORDER — MORPHINE SULFATE (PF) 4 MG/ML IV SOLN
4.0000 mg | Freq: Once | INTRAVENOUS | Status: AC
  Administered 2019-01-17: 04:00:00 4 mg via INTRAVENOUS
  Filled 2019-01-17: qty 1

## 2019-01-17 MED ORDER — POLYETHYLENE GLYCOL 3350 17 G PO PACK: 17.0000 g | PACK | Freq: Two times a day (BID) | ORAL | 0 refills | Status: AC

## 2019-01-17 MED ORDER — SENNA 8.6 MG PO TABS: 1.0000 | ORAL_TABLET | Freq: Every day | ORAL | 0 refills | Status: DC

## 2019-01-17 MED ORDER — ACETAMINOPHEN 500 MG PO TABS
1000.0000 mg | ORAL_TABLET | Freq: Once | ORAL | Status: AC
  Administered 2019-01-17: 05:00:00 1000 mg via ORAL
  Filled 2019-01-17: qty 2

## 2019-01-17 MED ORDER — OXYCODONE HCL 5 MG PO TABS: 5.0000 mg | ORAL_TABLET | ORAL | 0 refills | Status: DC | PRN

## 2019-01-17 MED ORDER — SODIUM CHLORIDE 0.9 % IV BOLUS
1000.0000 mL | Freq: Once | INTRAVENOUS | Status: AC
  Administered 2019-01-17: 05:00:00 1000 mL via INTRAVENOUS

## 2019-01-17 MED ORDER — MORPHINE SULFATE (PF) 4 MG/ML IV SOLN
4.0000 mg | Freq: Once | INTRAVENOUS | Status: AC
  Administered 2019-01-17: 06:00:00 4 mg via INTRAVENOUS
  Filled 2019-01-17: qty 1

## 2019-01-17 MED ORDER — ACETAMINOPHEN 500 MG PO TABS: 1000.0000 mg | ORAL_TABLET | Freq: Three times a day (TID) | ORAL | 0 refills | Status: AC | PRN

## 2019-01-17 MED ORDER — ONDANSETRON HCL 4 MG/2ML IJ SOLN
4.0000 mg | Freq: Once | INTRAMUSCULAR | Status: AC
  Administered 2019-01-17: 04:00:00 4 mg via INTRAVENOUS
  Filled 2019-01-17: qty 2

## 2019-01-17 MED ORDER — DOCUSATE SODIUM 100 MG PO CAPS: 100.0000 mg | ORAL_CAPSULE | Freq: Two times a day (BID) | ORAL | 0 refills | Status: AC

## 2019-01-17 MED ORDER — ONDANSETRON 4 MG PO TBDP: 4.0000 mg | ORAL_TABLET | Freq: Three times a day (TID) | ORAL | 0 refills | Status: DC | PRN

## 2019-01-17 NOTE — Discharge Instructions (Signed)
Constipation: Take colace twice a day everyday. Take senna once a day at bedtime. Take daily probiotics. Drink plenty of fluids and eat a diet rich in fiber. For the next 3 days take 1 cap full of Miralax in the morning and one in the evening with a large glass of water. If that does not work, then take 3600mg  or 57ml of 400mg /62ml Milk of Magnesia at bedtime.   Pain control: Take tylenol 1000mg  every 8 hours. Take 5mg  of oxycodone every 4 hours for breakthrough pain. I  Do not drink alcohol, drive or participate in any other potentially dangerous activities while taking this medication as it may make you sleepy. Do not take this medication with any other sedating medications, either prescription or over-the-counter.

## 2019-01-17 NOTE — ED Provider Notes (Signed)
[facility] Emergency Department Provider Note  ____________________________________________  Time seen: Approximately 4:02 AM  I have reviewed the triage vital signs and the nursing notes.   HISTORY  Chief Complaint Abdominal Pain   HPI Shannon Small is a 59 y.o. female with a history of obesity, GERD, prediabetes, hypertension, hysterectomy who presents for evaluation of abdominal pain.  Patient has had 2 days of progressively worsening diffuse sharp severe abdominal pain associated with nausea.  No vomiting.  She has been constipated which is chronic for her but usually she is able to have a bowel movement after taking over-the-counter medications.  This time she has been unable to do so.  She is passing flatus.  No fever or chills, no dysuria or hematuria, no chest pain or shortness of breath.   Past Medical History:  Diagnosis Date  . Acid reflux 09/09/2014  . Adjustment disorder with depressed mood 11/14/2006   ASSESSMENT: Bereavement. Recommend counseling with pastor or psychologist.   . Benign neoplasm of skin 11/14/2006  . Cephalalgia 02/10/2006  . Chronic infection of sinus 09/09/2014  . Extreme obesity 02/10/2006  . LBP (low back pain) 01/15/2007   Chronic back strain and spasms due to obesity.   . Sleep related leg cramps 09/09/2014    Patient Active Problem List   Diagnosis Date Noted  . Lymphedema 09/03/2018  . Chronic venous insufficiency 09/03/2018  . Morbid obesity ([place]) 09/26/2017  . Osteoarthritis of knee 09/26/2017  . Right knee pain 09/21/2017  . Chronic cough 03/02/2016  . Allergic rhinitis due to allergen 03/02/2016  . Pre-diabetes 03/01/2016  . Urinary incontinence, mixed 03/01/2016  . Hypertension 09/09/2014  . Acid reflux 09/09/2014  . Sleep related leg cramps 09/09/2014  . Low back pain 01/15/2007  . Benign neoplasm of skin 11/14/2006  . Cephalalgia 02/10/2006  . Morbid obesity with BMI of 45.0-49.9, adult ([place]) 02/10/2006     Past Surgical History:  Procedure Laterality Date  . ABDOMINAL HYSTERECTOMY    . RHINOPLASTY  1982    Prior to Admission medications   Medication Sig Start Date End Date Taking? Authorizing Provider  acetaminophen (TYLENOL) 500 MG tablet Take 2 tablets (1,000 mg total) by mouth every 8 (eight) hours as needed for mild pain, moderate pain, fever or headache. 01/17/19 01/17/20  Rudene Re, MD  albuterol (PROVENTIL HFA;VENTOLIN HFA) 108 (90 Base) MCG/ACT inhaler Inhale 2 puffs into the lungs every 4 (four) hours as needed for wheezing or shortness of breath (cough). 10/04/16   Karamalegos, Devonne Doughty, DO  clotrimazole-betamethasone (LOTRISONE) cream APPLY TOPICALLY TO GENITAL AREA FOR VAGINAL ITCHING AND IRRITATION FOR UP TO 2 WEEKS, THEN STOP. 07/24/18   Karamalegos, Devonne Doughty, DO  cyclobenzaprine (FLEXERIL) 10 MG tablet Take 1 tablet by mouth three times daily as needed for muscle spasm 07/24/18   Olin Hauser, DO  diphenhydrAMINE-zinc acetate (BENADRYL EXTRA STRENGTH) cream Apply 1 application topically 3 (three) times daily as needed for itching. 08/30/18   Earleen Newport, MD  docusate sodium (COLACE) 100 MG capsule Take 1 capsule (100 mg total) by mouth 2 (two) times daily. 01/17/19 01/17/20  Rudene Re, MD  fluticasone [facility]) 50 MCG/ACT nasal spray Place 2 sprays into both nostrils daily. Use for 4-6 weeks then stop and use seasonally or as needed. 07/24/18   Karamalegos, Devonne Doughty, DO  furosemide (LASIX) 20 MG tablet TAKE 1 TO 2 TABLETS BY MOUTH ONCE DAILY AS NEEDED FOR EDEMA. USE FOR SHORT TERM ONLY UP TO  3 TO 5 DAYS THEN STOP AND MAY REPEAT. 01/09/19   Parks Ranger, Devonne Doughty, DO  hydrochlorothiazide (HYDRODIURIL) 25 MG tablet Take 1 tablet by mouth once daily 09/19/18   Karamalegos, Devonne Doughty, DO  hydrocortisone 2.5 % cream APPLY  CREAM TO AFFECTED AREA TWICE DAILY AS NEEDED ECZEMA 02/07/18   Parks Ranger, Devonne Doughty, DO  ibuprofen (ADVIL) 800 MG tablet  Take 1 tablet (800 mg total) by mouth every 8 (eight) hours as needed. 09/19/18   Karamalegos, Devonne Doughty, DO  ketoconazole (NIZORAL) 2 % cream Apply topically daily as needed for irritation. 01/09/19   Karamalegos, Devonne Doughty, DO  lisinopril (ZESTRIL) 20 MG tablet Take 1 tablet by mouth once daily 09/19/18   Olin Hauser, DO  nystatin (MYCOSTATIN/NYSTOP) powder Apply topically 3 (three) times daily. Under breasts for 1-2 weeks or until healed 08/22/17   Parks Ranger, Devonne Doughty, DO  ondansetron (ZOFRAN ODT) 4 MG disintegrating tablet Take 1 tablet (4 mg total) by mouth every 8 (eight) hours as needed. 01/17/19   Rudene Re, MD  orphenadrine (NORFLEX) 100 MG tablet Take 1 tablet (100 mg total) by mouth 2 (two) times daily as needed for muscle spasms. 09/19/18   Karamalegos, Devonne Doughty, DO  oxyCODONE (ROXICODONE) 5 MG immediate release tablet Take 1 tablet (5 mg total) by mouth every 4 (four) hours as needed. 01/17/19 01/17/20  Rudene Re, MD  polyethylene glycol (MIRALAX MIX-IN PAX) 17 g packet Take 17 g by mouth 2 (two) times daily for 5 days. 01/17/19 01/22/19  Rudene Re, MD  senna (SENOKOT) 8.6 MG TABS tablet Take 1 tablet (8.6 mg total) by mouth at bedtime. 01/17/19   Alfred Levins, [place], MD  triamcinolone cream (KENALOG) 0.5 % APPLY TOPICALLY TWICE DAILY FOR UP TO 2 WEEKS 09/19/18   Olin Hauser, DO    Allergies Patient has no known allergies.  Family History  Problem Relation Age of Onset  . Stomach cancer Father   . Kidney cancer Father   . Diabetes Maternal Grandmother   . Stroke Maternal Grandmother   . Stroke Maternal Grandfather   . Heart disease Paternal Grandmother   . Stroke Paternal Grandfather   . Diabetes Mother   . Alcohol abuse Brother   . Breast cancer Paternal Aunt   . Lung cancer Paternal Aunt   . Bladder Cancer Neg Hx   . Prostate cancer Neg Hx     Social History Social History   Tobacco Use  . Smoking status: Never Smoker  .  Smokeless tobacco: Never Used  Substance Use Topics  . Alcohol use: Yes    Alcohol/week: 0.0 standard drinks    Comment: rarely  . Drug use: No    Review of Systems  Constitutional: Negative for fever. Eyes: Negative for visual changes. ENT: Negative for sore throat. Neck: No neck pain  Cardiovascular: Negative for chest pain. Respiratory: Negative for shortness of breath. Gastrointestinal: + abdominal pain, nausea, and constipation. No vomiting or diarrhea. Genitourinary: Negative for dysuria. Musculoskeletal: Negative for back pain. Skin: Negative for rash. Neurological: Negative for headaches, weakness or numbness. Psych: No SI or HI  ____________________________________________   PHYSICAL EXAM:  VITAL SIGNS: ED Triage Vitals  Enc Vitals Group     BP 01/17/19 0004 (!) 172/102     Pulse Rate 01/17/19 0004 98     Resp 01/17/19 0004 18     Temp 01/17/19 0004 98.4 F (36.9 C)     Temp Source 01/17/19 0004 Oral     SpO2  01/17/19 0004 96 %     Weight 01/17/19 0004 (!) 305 lb (138.3 kg)     Height 01/17/19 0004 '5\' 5"'  (1.651 m)     Head Circumference --      Peak Flow --      Pain Score 01/17/19 0012 10     Pain Loc --      Pain Edu? --      Excl. in [place]? --     Constitutional: Alert and oriented. Well appearing and in no apparent distress. HEENT:      Head: Normocephalic and atraumatic.         Eyes: Conjunctivae are normal. Sclera is non-icteric.       Mouth/Throat: Mucous membranes are moist.       Neck: Supple with no signs of meningismus. Cardiovascular: Regular rate and rhythm. No murmurs, gallops, or rubs. 2+ symmetrical distal pulses are present in all extremities. No JVD. Respiratory: Normal respiratory effort. Lungs are clear to auscultation bilaterally. No wheezes, crackles, or rhonchi.  Gastrointestinal: Obese diffusely tender to palpation worse in the epigastric region and right upper quadrant, and non distended with positive bowel sounds. No rebound or  guarding. Genitourinary: No CVA tenderness. Musculoskeletal: Nontender with normal range of motion in all extremities. No edema, cyanosis, or erythema of extremities. Neurologic: Normal speech and language. Face is symmetric. Moving all extremities. No gross focal neurologic deficits are appreciated. Skin: Skin is warm, dry and intact. No rash noted. Psychiatric: Mood and affect are normal. Speech and behavior are normal.  ____________________________________________   LABS (all labs ordered are listed, but only abnormal results are displayed)  Labs Reviewed  LIPASE, BLOOD - Abnormal; Notable for the following components:      Result Value   Lipase 268 (*)    All other components within normal limits  COMPREHENSIVE METABOLIC PANEL - Abnormal; Notable for the following components:   Glucose, Bld 118 (*)    AST 14 (*)    All other components within normal limits  CBC - Abnormal; Notable for the following components:   WBC 16.0 (*)    All other components within normal limits  URINALYSIS, COMPLETE (UACMP) WITH MICROSCOPIC - Abnormal; Notable for the following components:   Color, Urine YELLOW (*)    APPearance HAZY (*)    Hgb urine dipstick SMALL (*)    Ketones, ur 20 (*)    Bacteria, UA RARE (*)    All other components within normal limits  LACTATE DEHYDROGENASE   ____________________________________________  EKG  ED ECG REPORT I, Rudene Re, the attending physician, personally viewed and interpreted this ECG.  Normal sinus rhythm, rate of 93, normal intervals, LVH, no ST elevations or depressions.  Unchanged from prior. ____________________________________________  RADIOLOGY  I have personally reviewed the images performed during this visit and I agree with the Radiologist's read.   Interpretation by Radiologist:  DG Abdomen 1 View  Result Date: 01/17/2019 CLINICAL DATA:  Abdominal and low back pain began yesterday, reported constipation. EXAM: ABDOMEN - 1 VIEW  COMPARISON:  None. FINDINGS: No high-grade obstructive bowel gas pattern is seen. No suspicious calcifications over the renal shadows, course of either ureter or overlying the gallbladder fossa. There is a moderate stool burden. Evaluation for free air is limited on supine view. Multilevel degenerative changes present in the imaged spine as well as degenerative features in the SI joints and both hips. IMPRESSION: 1. No evidence of bowel obstruction or other acute abnormality. 2. Moderate stool burden. Electronically  Signed   By: Lovena Le M.D.   On: 01/17/2019 05:03   US Abdomen Limited RUQ  Result Date: 01/17/2019 CLINICAL DATA:  Right upper quadrant pain for 1 day, leukocytosis and elevated lipase EXAM: ULTRASOUND ABDOMEN LIMITED RIGHT UPPER QUADRANT COMPARISON:  Abdominal ultrasound Jun 09, 2006 FINDINGS: Gallbladder: No gallstones or wall thickening visualized. No sonographic Murphy sign noted by sonographer. Common bile duct: Diameter: 3.4 mm, nondilated. Liver: No focal lesion identified. Within normal limits in parenchymal echogenicity. Portal vein is patent on color Doppler imaging with normal direction of blood flow towards the liver. Other: None. IMPRESSION: Unremarkable right upper quadrant ultrasound. Electronically Signed   By: Lovena Le M.D.   On: 01/17/2019 04:53      ____________________________________________   PROCEDURES  Procedure(s) performed: None Procedures Critical Care performed:  None ____________________________________________   INITIAL IMPRESSION / ASSESSMENT AND PLAN / ED COURSE   59 y.o. female with a history of obesity, GERD, prediabetes, hypertension, hysterectomy who presents for evaluation of abdominal pain, nausea, and constipation.  Ddx constipation, SBO, pancreatitis, GB disease, PUD, gastritis, diverticulitis, appendicitis.  Patient looks uncomfortable, vitals are within normal limits, abdomen is obese, with diffuse tenderness, worse in the  epigastric and right upper quadrant regions with no rebound or guarding.  Labs showing leukocytosis with white count of 16, glucose of 118, lipase of 268, normal LFTs, alk phos and T bili.  No significant electrolyte derangements.  EKG with no evidence of ischemia.  We will send patient for right upper quadrant ultrasound to rule out gallstone pancreatitis.  Patient has no history of alcohol abuse.  Will give morphine and Zofran for symptom relief.   Clinical Course as of Jan 17 651  Thu Jan 17, 2019  2119 Ultrasound negative for gallstones.  Normal common bile duct.  Based on R[place] criteria patient is low risk.  After IV morphine, p.o. Tylenol, p.o. oxycodone pain is now 7 out of 10.  Will redose of morphine and reassess.   [CV]    Clinical Course User Index [CV] Alfred Levins [place], MD   _________________________ 4:17 AM on 01/17/2019 -----------------------------------------  Pain is well controlled.  Patient is tolerating p.o.  Will discharge home on a bland diet, pain control for pancreatitis and stool softeners and laxatives for her constipation.  I discussed the importance to follow-up with her primary care doctor after her symptoms have fully resolved for a CT scan to rule out possible malignancy as the cause of her pancreatitis.  Discussed return precautions for worsening pain, fever, worsening nausea or vomiting.  Otherwise discuss home supportive care and pain control.   As part of my medical decision making, I reviewed the following data within the [place] notes reviewed and incorporated, Labs reviewed , EKG interpreted , Old EKG reviewed, Old chart reviewed, Radiograph reviewed , Notes from prior ED visits and [place] Controlled Substance Database   Please note:  Patient was evaluated in Emergency Department today for the symptoms described in the history of present illness. Patient was evaluated in the context of the global COVID-19 pandemic, which  necessitated consideration that the patient might be at risk for infection with the SARS-CoV-2 virus that causes COVID-19. Institutional protocols and algorithms that pertain to the evaluation of patients at risk for COVID-19 are in a state of rapid change based on information released by regulatory bodies including the CDC and federal and state organizations. These policies and algorithms were followed during the patient's care in the ED.  Some ED evaluations and interventions may be delayed as a result of limited staffing during the pandemic.   ____________________________________________   FINAL CLINICAL IMPRESSION(S) / ED DIAGNOSES   Final diagnoses:  RUQ abdominal pain  Acute pancreatitis, unspecified complication status, unspecified pancreatitis type  Constipation, unspecified constipation type      NEW MEDICATIONS STARTED DURING THIS VISIT:  ED Discharge Orders         Ordered    oxyCODONE (ROXICODONE) 5 MG immediate release tablet  Every 4 hours PRN     01/17/19 0653    ondansetron (ZOFRAN ODT) 4 MG disintegrating tablet  Every 8 hours PRN     01/17/19 0653    acetaminophen (TYLENOL) 500 MG tablet  Every 8 hours PRN     01/17/19 0653    polyethylene glycol (MIRALAX MIX-IN PAX) 17 g packet  2 times daily     01/17/19 0653    senna (SENOKOT) 8.6 MG TABS tablet  Daily at bedtime     01/17/19 0653    docusate sodium (COLACE) 100 MG capsule  2 times daily     01/17/19 2811           Note:  This document was prepared using Dragon voice recognition software and may include unintentional dictation errors.    Alfred Levins, [place], MD 01/17/19 [phone]

## 2019-01-17 NOTE — ED Notes (Signed)
Pt given saltines and water for PO challenge

## 2019-01-17 NOTE — ED Triage Notes (Signed)
Pt to triage via wheelchair.  Pt has abd pain and low back pain.  Sx began yesterday.  Reports constipation. And used a suppository tonight.  Pt tearful in triage.  Pt alert  Speech clear.

## 2019-01-17 NOTE — ED Notes (Signed)
Ultrasound tech at bedside

## 2019-01-17 NOTE — ED Notes (Signed)
Patient transported to X-ray 

## 2019-01-28 ENCOUNTER — Other Ambulatory Visit: Payer: Self-pay

## 2019-01-28 ENCOUNTER — Ambulatory Visit (INDEPENDENT_AMBULATORY_CARE_PROVIDER_SITE_OTHER): Payer: 59 | Admitting: Family Medicine

## 2019-01-28 ENCOUNTER — Encounter: Payer: Self-pay | Admitting: Family Medicine

## 2019-01-28 VITALS — BP 138/75 | HR 79 | Temp 98.0°F | Resp 16 | Ht 65.0 in | Wt 306.0 lb

## 2019-01-28 DIAGNOSIS — K8500 Idiopathic acute pancreatitis without necrosis or infection: Secondary | ICD-10-CM

## 2019-01-28 NOTE — Patient Instructions (Addendum)
Thank you for coming to the office today.  Try best to follow pancreatitis diet as discussed.  You will be called with apt for scheduling CT scan for follow-up pancreas   Please schedule a Follow-up Appointment to: Return if symptoms worsen or fail to improve.  If you have any other questions or concerns, please feel free to call the office or send a message through [place]. You may also schedule an earlier appointment if necessary.  Additionally, you may be receiving a survey about your experience at our office within a few days to 1 week by e-mail or mail. We value your feedback.  [name], DO [facility], [facility]   Pancreatitis Eating Plan Pancreatitis is when your pancreas becomes irritated and swollen (inflamed). The pancreas is a small organ located behind your stomach. It helps your body digest food and regulate your blood sugar. Pancreatitis can affect how your body digests food, especially foods with fat. You may also have other symptoms such as abdominal pain or nausea. When you have pancreatitis, following a low-fat eating plan may help you manage symptoms and recover more quickly. Work with your health care provider or a diet and nutrition specialist (dietitian) to create an eating plan that is right for you. What are tips for following this plan? Reading food labels Use the information on food labels to help keep track of how much fat you eat:  Check the serving size.  Look for the amount of total fat in grams (g) in one serving. ? Low-fat foods have 3 g of fat or less per serving. ? Fat-free foods have 0.5 g of fat or less per serving.  Keep track of how much fat you eat based on how many servings you eat. ? For example, if you eat two servings, the amount of fat you eat will be two times what is listed on the label. Shopping   Buy low-fat or nonfat foods, such as: ? Fresh, frozen, or canned fruits and vegetables. ? Grains, including pasta,  bread, and rice. ? Lean meat, poultry, fish, and other protein foods. ? Low-fat or nonfat dairy.  Avoid buying bakery products and other sweets made with whole milk, butter, and eggs.  Avoid buying snack foods with added fat, such as anything with butter or cheese flavoring. Cooking  Remove skin from poultry, and remove extra fat from meat.  Limit the amount of fat and oil you use to 6 teaspoons or less per day.  Cook using low-fat methods, such as boiling, broiling, grilling, steaming, or baking.  Use spray oil to cook. Add fat-free chicken broth to add flavor and moisture.  Avoid adding cream to thicken soups or sauces. Use other thickeners such as corn starch or tomato paste. Meal planning   Eat a low-fat diet as told by your dietitian. For most people, this means having no more than 55-65 grams of fat each day.  Eat small, frequent meals throughout the day. For example, you may have 5-6 small meals instead of 3 large meals.  Drink enough fluid to keep your urine pale yellow.  Do not drink alcohol. Talk to your health care provider if you need help stopping.  Limit how much caffeine you have, including black coffee, black and green tea, caffeinated soft drinks, and energy drinks. General information  Let your health care provider or dietitian know if you have unplanned weight loss on this eating plan.  You may be instructed to follow a clear liquid diet  during a flare of symptoms. Talk with your health care provider about how to manage your diet during symptoms of a flare.  Take any vitamins or supplements as told by your health care provider.  Work with a Microbiologist, especially if you have other conditions such as obesity or diabetes mellitus. What foods should I avoid? Fruits Fried fruits. Fruits served with butter or cream. Vegetables Fried vegetables. Vegetables cooked with butter, cheese, or cream. Grains Biscuits, waffles, donuts, pastries, and croissants. Pies  and cookies. Butter-flavored popcorn. Regular crackers. Meats and other protein foods Fatty cuts of meat. Poultry with skin. Organ meats. Bacon, sausage, and cold cuts. Whole eggs. Nuts and nut butters. Dairy Whole and 2% milk. Whole milk yogurt. Whole milk ice cream. Cream and half-and-half. Cream cheese. Sour cream. Cheese. Beverages Wine, beer, and liquor. The items listed above may not be a complete list of foods and beverages to avoid. Contact a dietitian for more information. Summary  Pancreatitis can affect how your body digests food, especially foods with fat.  When you have pancreatitis, it is recommended that you follow a low-fat eating plan to help you recover more quickly and manage symptoms. For most people, this means limiting fat to no more than 55-65 grams per day.  Do not drink alcohol. Limit the amount of caffeine you have, and drink enough fluid to keep your urine pale yellow. This information is not intended to replace advice given to you by your health care provider. Make sure you discuss any questions you have with your health care provider. Document Revised: 04/19/2018 Document Reviewed: 04/04/2017 Elsevier Patient Education  [place].

## 2019-01-28 NOTE — Progress Notes (Signed)
Subjective:    Patient ID: Shannon Small, female    DOB: 07/17/1960, 60 y.o.   MRN: [id number]  Shannon Small is a 59 y.o. female presenting on 01/28/2019 for Hospitalization Follow-up (acute Pancreatitis)   HPI    ED FOLLOW-UP VISIT  Hospital/Location: [facility] Date of ED Visit: 01/17/19  Reason for Presenting to ED: Acute Abdominal Pain Primary (+Secondary) Diagnosis: Acute Pancreatitis  FOLLOW-UP  - ED provider note and record have been reviewed - Patient presents today about 11 days after recent ED visit. Brief summary of recent course, patient had symptoms of acute abdominal pain nausea vomiting, presented to ED dx with acute pancreatitis, testing in ED with labs showed Lipase 268, Glucose 118, WBC 16, Ultrasound negative for gallstones, ranson criteria low risk per ED, given IV morphine pain control and nausea control IVF, discharged home on outpatient basis for pain management, and future recommended outpatient CT to rule out malignancy or other cause of pancreatitis.  - Today reports overall has done well after discharge from ED. Symptoms of abdominal pain have greatly improved, but has had mild flare up occasional with eating red meat / beef and fried foods she has improved her diet so far though and today is asking what foods can she eat to prevent this problem.  Denies active constipation, nausea vomiting, dark stool blood in stool  Admits constipation, She tried rx Senokot, miralax PRN with some results   I have reviewed the discharge medication list, and have reconciled the current and discharge medications today.   Depression screen [facility] 2/9 01/28/2019 08/20/2018 07/17/2018  Decreased Interest 0 0 0  Down, Depressed, Hopeless 0 0 0  PHQ - 2 Score 0 0 0  Altered sleeping 0 - -  Tired, decreased energy 0 - -  Change in appetite 0 - -  Feeling bad or failure about yourself  0 - -  Trouble concentrating 0 - -  Moving slowly or fidgety/restless 0 - -  Suicidal thoughts 0 - -    PHQ-9 Score 0 - -  Difficult doing work/chores Not difficult at all - -    Social History   Tobacco Use  . Smoking status: Never Smoker  . Smokeless tobacco: Never Used  Substance Use Topics  . Alcohol use: Yes    Alcohol/week: 0.0 standard drinks    Comment: rarely  . Drug use: No    Review of Systems Per HPI unless specifically indicated above     Objective:    BP 138/75   Pulse 79   Temp 98 F (36.7 C) (Oral)   Resp 16   Ht 5\' 5"  (1.651 m)   Wt (!) 306 lb (138.8 kg)   BMI 50.92 kg/m   Wt Readings from Last 3 Encounters:  01/28/19 (!) 306 lb (138.8 kg)  01/17/19 (!) 305 lb (138.3 kg)  11/01/18 (!) 309 lb (140.2 kg)    Physical Exam Vitals and nursing note reviewed.  Constitutional:      General: She is not in acute distress.    Appearance: She is well-developed. She is not diaphoretic.     Comments: Well-appearing, comfortable, cooperative, obese  HENT:     Head: Normocephalic and atraumatic.  Eyes:     General:        Right eye: No discharge.        Left eye: No discharge.     Conjunctiva/sclera: Conjunctivae normal.  Neck:     Thyroid: No thyromegaly.  Cardiovascular:  Rate and Rhythm: Normal rate and regular rhythm.     Heart sounds: Normal heart sounds. No murmur.  Pulmonary:     Effort: Pulmonary effort is normal. No respiratory distress.     Breath sounds: Normal breath sounds. No wheezing or rales.  Abdominal:     General: Bowel sounds are normal. There is no distension.     Palpations: Abdomen is soft. There is no mass.     Tenderness: There is no abdominal tenderness.  Musculoskeletal:        General: Normal range of motion.     Cervical back: Normal range of motion and neck supple.  Lymphadenopathy:     Cervical: No cervical adenopathy.  Skin:    General: Skin is warm and dry.     Findings: No erythema or rash.  Neurological:     Mental Status: She is alert and oriented to person, place, and time.  Psychiatric:        Behavior:  Behavior normal.     Comments: Well groomed, good eye contact, normal speech and thoughts      I have personally reviewed the radiology report from 01/17/19 Abd [place] and X-ray  CLINICAL DATA:  Right upper quadrant pain for 1 day, leukocytosis and elevated lipase  EXAM: ULTRASOUND ABDOMEN LIMITED RIGHT UPPER QUADRANT  COMPARISON:  Abdominal ultrasound Jun 09, 2006  FINDINGS: Gallbladder:  No gallstones or wall thickening visualized. No sonographic Murphy sign noted by sonographer.  Common bile duct:  Diameter: 3.4 mm, nondilated.  Liver:  No focal lesion identified. Within normal limits in parenchymal echogenicity. Portal vein is patent on color Doppler imaging with normal direction of blood flow towards the liver.  Other: None.  IMPRESSION: Unremarkable right upper quadrant ultrasound.   Electronically Signed   By: Lovena Le M.D.   On: 01/17/2019 04:53   CLINICAL DATA:  Abdominal and low back pain began yesterday, reported constipation.  EXAM: ABDOMEN - 1 VIEW  COMPARISON:  None.  FINDINGS: No high-grade obstructive bowel gas pattern is seen. No suspicious calcifications over the renal shadows, course of either ureter or overlying the gallbladder fossa. There is a moderate stool burden. Evaluation for free air is limited on supine view. Multilevel degenerative changes present in the imaged spine as well as degenerative features in the SI joints and both hips.  IMPRESSION: 1. No evidence of bowel obstruction or other acute abnormality. 2. Moderate stool burden.   Electronically Signed   By: Lovena Le M.D.   On: 01/17/2019 05:03.  Results for orders placed or performed during the hospital encounter of 01/17/19  Lipase, blood  Result Value Ref Range   Lipase 268 (H) 11 - 51 U/L  Comprehensive metabolic panel  Result Value Ref Range   Sodium 137 135 - 145 mmol/L   Potassium 3.6 3.5 - 5.1 mmol/L   Chloride 103 98 - 111 mmol/L     CO2 22 22 - 32 mmol/L   Glucose, Bld 118 (H) 70 - 99 mg/dL   BUN 16 6 - 20 mg/dL   Creatinine, Ser 0.65 0.44 - 1.00 mg/dL   Calcium 9.8 8.9 - 10.3 mg/dL   Total Protein 7.7 6.5 - 8.1 g/dL   Albumin 4.1 3.5 - 5.0 g/dL   AST 14 (L) 15 - 41 U/L   ALT 11 0 - 44 U/L   Alkaline Phosphatase 71 38 - 126 U/L   Total Bilirubin 0.6 0.3 - 1.2 mg/dL   GFR calc non Af Amer >  60 >60 mL/min   GFR calc Af Amer >60 >60 mL/min   Anion gap 12 5 - 15  CBC  Result Value Ref Range   WBC 16.0 (H) 4.0 - 10.5 K/uL   RBC 4.71 3.87 - 5.11 MIL/uL   Hemoglobin 13.2 12.0 - 15.0 g/dL   HCT 40.4 36.0 - 46.0 %   MCV 85.8 80.0 - 100.0 fL   MCH 28.0 26.0 - 34.0 pg   MCHC 32.7 30.0 - 36.0 g/dL   RDW 13.0 11.5 - 15.5 %   Platelets 289 150 - 400 K/uL   nRBC 0.0 0.0 - 0.2 %  Urinalysis, Complete w Microscopic  Result Value Ref Range   Color, Urine YELLOW (A) YELLOW   APPearance HAZY (A) CLEAR   Specific Gravity, Urine 1.012 1.005 - 1.030   pH 6.0 5.0 - 8.0   Glucose, UA NEGATIVE NEGATIVE mg/dL   Hgb urine dipstick SMALL (A) NEGATIVE   Bilirubin Urine NEGATIVE NEGATIVE   Ketones, ur 20 (A) NEGATIVE mg/dL   Protein, ur NEGATIVE NEGATIVE mg/dL   Nitrite NEGATIVE NEGATIVE   Leukocytes,Ua NEGATIVE NEGATIVE   RBC / HPF 0-5 0 - 5 RBC/hpf   WBC, UA 0-5 0 - 5 WBC/hpf   Bacteria, UA RARE (A) NONE SEEN   Squamous Epithelial / LPF 0-5 0 - 5   Mucus PRESENT   Lactate dehydrogenase  Result Value Ref Range   LDH 128 98 - 192 U/L      Assessment & Plan:   Problem List Items Addressed This Visit    None    Visit Diagnoses    Idiopathic acute pancreatitis without infection or necrosis    -  Primary   Relevant Orders   CT PANCREAS ABDOMEN W WO CONTRAST      Clinically with acute pancreatitis, uncertain etiology considered idiopathic so far 2 weeks ago approx Possible alcohol component, but doesn't seem to be precise cause. No HyperTG. No gallstones seen.  Plan - Current flare has mostly resolved, tolerating  PO - Reassurance today, improving diet seems to have improved symptoms - ORDER CT Pancreas w and wo contrast for evaluation of other cause, for initial pancreatitis, now that symptoms have improved, asked based on prior imaging [place] / X-ray to next evaluate with CT to rule out potential malignancy or other cause. Will order and schedule, f/u with results notify patient  Diet handouts given to patient recommended diets to limit pancreatitis.  Orders Placed This Encounter  Procedures  . CT PANCREAS ABDOMEN W WO CONTRAST    Standing Status:   Future    Standing Expiration Date:   04/27/2020    Order Specific Question:   ** REASON FOR EXAM (FREE TEXT)    Answer:   initial episode acute pancreatitis, idiopathic but may be dietary cause, RUQ Abd [place] and Abd X-ray without other abnormality, no gallstones, requested to repeat CT once symptoms resolved to rule out malignancy    Order Specific Question:   If indicated for the ordered procedure, I authorize the administration of contrast media per Radiology protocol    Answer:   Yes    Order Specific Question:   Is patient pregnant?    Answer:   No    Order Specific Question:   Preferred imaging location?    Answer:   [facility]    Order Specific Question:   Radiology Contrast Protocol - do NOT remove file path    Answer:   [email or web address]  No orders of the defined types were placed in this encounter.    Follow up plan: Return if symptoms worsen or fail to improve.   [name], [facility] 01/28/2019, 10:34 AM

## 2019-01-29 ENCOUNTER — Telehealth: Payer: Self-pay | Admitting: Family Medicine

## 2019-01-29 DIAGNOSIS — K8500 Idiopathic acute pancreatitis without necrosis or infection: Secondary | ICD-10-CM

## 2019-01-29 MED ORDER — OXYCODONE HCL 5 MG PO TABS: 5.0000 mg | ORAL_TABLET | Freq: Four times a day (QID) | ORAL | 0 refills | Status: DC | PRN

## 2019-01-29 NOTE — Telephone Encounter (Signed)
Agreed with refill oxycodone 5mg  IR #20 take 1 q 6 hr PRN acute abdominal pain assoc with pancreatitis now she is improving from hospitalization, awaiting CT next week.  [name], [facility] 01/29/2019, 2:11 PM

## 2019-01-29 NOTE — Telephone Encounter (Signed)
Pt is requesting refill on Oxycodone 5mg   Until she can get to her appt

## 2019-02-06 ENCOUNTER — Ambulatory Visit: Payer: 59

## 2019-02-14 ENCOUNTER — Other Ambulatory Visit: Payer: Self-pay

## 2019-02-14 ENCOUNTER — Ambulatory Visit
Admission: RE | Admit: 2019-02-14 | Discharge: 2019-02-14 | Disposition: A | Discharge status: Home / Self Care | Payer: 59 | Source: Ambulatory Visit | Attending: Family Medicine | Admitting: Family Medicine

## 2019-02-14 DIAGNOSIS — K8500 Idiopathic acute pancreatitis without necrosis or infection: Secondary | ICD-10-CM | POA: Diagnosis present

## 2019-02-14 HISTORY — DX: Essential (primary) hypertension: I10

## 2019-02-14 MED ORDER — IOHEXOL 300 MG/ML  SOLN
125.0000 mL | Freq: Once | INTRAMUSCULAR | Status: AC | PRN
  Administered 2019-02-14: 125 mL via INTRAVENOUS

## 2019-02-19 ENCOUNTER — Encounter: Payer: 59 | Admitting: Family Medicine

## 2019-03-02 ENCOUNTER — Other Ambulatory Visit: Payer: Self-pay | Admitting: Family Medicine

## 2019-03-02 DIAGNOSIS — S39012A Strain of muscle, fascia and tendon of lower back, initial encounter: Secondary | ICD-10-CM

## 2019-03-02 DIAGNOSIS — M62830 Muscle spasm of back: Secondary | ICD-10-CM

## 2019-03-05 ENCOUNTER — Other Ambulatory Visit: Payer: Self-pay

## 2019-03-05 ENCOUNTER — Encounter: Payer: Self-pay | Admitting: Family Medicine

## 2019-03-05 ENCOUNTER — Ambulatory Visit (INDEPENDENT_AMBULATORY_CARE_PROVIDER_SITE_OTHER): Payer: 59 | Admitting: Family Medicine

## 2019-03-05 VITALS — BP 137/80 | HR 85 | Temp 97.5°F | Resp 16 | Ht 65.0 in | Wt 315.4 lb

## 2019-03-05 DIAGNOSIS — Z0000 Encounter for general adult medical examination without abnormal findings: Secondary | ICD-10-CM | POA: Diagnosis not present

## 2019-03-05 DIAGNOSIS — Z1231 Encounter for screening mammogram for malignant neoplasm of breast: Secondary | ICD-10-CM

## 2019-03-05 DIAGNOSIS — Z6841 Body Mass Index (BMI) 40.0 and over, adult: Secondary | ICD-10-CM

## 2019-03-05 DIAGNOSIS — Z1211 Encounter for screening for malignant neoplasm of colon: Secondary | ICD-10-CM

## 2019-03-05 NOTE — Progress Notes (Signed)
Subjective:    Patient ID: Shannon Small, female    DOB: 04/07/1960, 59 y.o.   MRN: [id number]  Shannon Small is a 59 y.o. female presenting on 03/05/2019 for Annual Exam   HPI   Here for Annual Physical and Lab Review.  Morbid Obesity BMI >52 Previous weight loss on keto diet. Recently has had some wt gain up to 10 lbs in past 1-2 months, following acute pancreatitis episode, now goal to resume keto diet and lifestyle improvement - Improving diet, gradually increasing activity walking resume  CHRONIC HTN: Doing well. No new concerns. Current Meds -HCTZ 25mg  daily, Lisinopril 10mg  daily Taking meds  F/u Pancreatitis Last seen 01/28/19 for follow-up on this issue, had repeat CT showed improved / resolved inflammation. She has had residual abdominal pains, treated with short course opiate with oxycodone IR. Now has resolving pain only mid now. No further nausea vomiting. She plans to improve diet soon for weight loss and re-try keto diet, and limit alcohol  Health Maintenance:  Breast CA Screening: Due for mammogram screening. Last mammogram result >2-3 years unsure result. No prior history abnormal mammogram known family history of breast cancer. Currently asymptomatic.  Colon CA Screening: Last Colonoscopy >5 years ago, no result available. Currently asymptomatic. Known family history of colon CA. Due for screening test Cologuard    Depression screen [facility] 2/9 03/05/2019 01/28/2019 08/20/2018  Decreased Interest 0 0 0  Down, Depressed, Hopeless 0 0 0  PHQ - 2 Score 0 0 0  Altered sleeping 0 0 -  Tired, decreased energy 0 0 -  Change in appetite 0 0 -  Feeling bad or failure about yourself  0 0 -  Trouble concentrating 0 0 -  Moving slowly or fidgety/restless 0 0 -  Suicidal thoughts 0 0 -  PHQ-9 Score 0 0 -  Difficult doing work/chores Not difficult at all Not difficult at all -    Past Medical History:  Diagnosis Date  . Acid reflux 09/09/2014  . Adjustment disorder with  depressed mood 11/14/2006   ASSESSMENT: Bereavement. Recommend counseling with pastor or psychologist.   . Benign neoplasm of skin 11/14/2006  . Cephalalgia 02/10/2006  . Chronic infection of sinus 09/09/2014  . Extreme obesity 02/10/2006  . Hypertension   . LBP (low back pain) 01/15/2007   Chronic back strain and spasms due to obesity.   . Sleep related leg cramps 09/09/2014   Past Surgical History:  Procedure Laterality Date  . ABDOMINAL HYSTERECTOMY    . RHINOPLASTY  1982   Social History   Socioeconomic History  . Marital status: Married    Spouse name: Not on file  . Number of children: Not on file  . Years of education: Not on file  . Highest education level: Not on file  Occupational History  . Not on file  Tobacco Use  . Smoking status: Never Smoker  . Smokeless tobacco: Never Used  Substance and Sexual Activity  . Alcohol use: Yes    Alcohol/week: 0.0 standard drinks    Comment: rarely  . Drug use: No  . Sexual activity: Not on file  Other Topics Concern  . Not on file  Social History Narrative  . Not on file   Social Determinants of Health   Financial Resource Strain:   . Difficulty of Paying Living Expenses: Not on file  Food Insecurity:   . Worried About Charity fundraiser in the Last Year: Not on file  . Ran  Out of Food in the Last Year: Not on file  Transportation Needs:   . Lack of Transportation (Medical): Not on file  . Lack of Transportation (Non-Medical): Not on file  Physical Activity:   . Days of Exercise per Week: Not on file  . Minutes of Exercise per Session: Not on file  Stress:   . Feeling of Stress : Not on file  Social Connections:   . Frequency of Communication with Friends and Family: Not on file  . Frequency of Social Gatherings with Friends and Family: Not on file  . Attends Religious Services: Not on file  . Active Member of Clubs or Organizations: Not on file  . Attends Archivist Meetings: Not on file  . Marital Status:  Not on file  Intimate Partner Violence:   . Fear of Current or Ex-Partner: Not on file  . Emotionally Abused: Not on file  . Physically Abused: Not on file  . Sexually Abused: Not on file   Family History  Problem Relation Age of Onset  . Stomach cancer Father   . Kidney cancer Father   . Diabetes Maternal Grandmother   . Stroke Maternal Grandmother   . Stroke Maternal Grandfather   . Heart disease Paternal Grandmother   . Stroke Paternal Grandfather   . Diabetes Mother   . Alcohol abuse Brother   . Breast cancer Paternal Aunt   . Lung cancer Paternal Aunt   . Bladder Cancer Neg Hx   . Prostate cancer Neg Hx    Current Outpatient Medications on File Prior to Visit  Medication Sig  . acetaminophen (TYLENOL) 500 MG tablet Take 2 tablets (1,000 mg total) by mouth every 8 (eight) hours as needed for mild pain, moderate pain, fever or headache.  . clotrimazole-betamethasone (LOTRISONE) cream APPLY TOPICALLY TO GENITAL AREA FOR VAGINAL ITCHING AND IRRITATION FOR UP TO 2 WEEKS, THEN STOP.  Marland Kitchen cyclobenzaprine (FLEXERIL) 10 MG tablet Take 1 tablet by mouth three times daily as needed  . hydrochlorothiazide (HYDRODIURIL) 25 MG tablet Take 1 tablet by mouth once daily  . hydrocortisone 2.5 % cream APPLY  CREAM TO AFFECTED AREA TWICE DAILY AS NEEDED ECZEMA  . ibuprofen (ADVIL) 800 MG tablet Take 1 tablet (800 mg total) by mouth every 8 (eight) hours as needed.  Marland Kitchen ketoconazole (NIZORAL) 2 % cream Apply topically daily as needed for irritation.  Marland Kitchen lisinopril (ZESTRIL) 20 MG tablet Take 1 tablet by mouth once daily  . orphenadrine (NORFLEX) 100 MG tablet Take 1 tablet (100 mg total) by mouth 2 (two) times daily as needed for muscle spasms.  Marland Kitchen docusate sodium (COLACE) 100 MG capsule Take 1 capsule (100 mg total) by mouth 2 (two) times daily. (Patient not taking: Reported on 03/05/2019)  . furosemide (LASIX) 20 MG tablet TAKE 1 TO 2 TABLETS BY MOUTH ONCE DAILY AS NEEDED FOR EDEMA. USE FOR SHORT TERM  ONLY UP TO 3 TO 5 DAYS THEN STOP AND MAY REPEAT. (Patient not taking: Reported on 03/05/2019)  . nystatin (MYCOSTATIN/NYSTOP) powder Apply topically 3 (three) times daily. Under breasts for 1-2 weeks or until healed (Patient not taking: Reported on 01/28/2019)  . triamcinolone cream (KENALOG) 0.5 % APPLY TOPICALLY TWICE DAILY FOR UP TO 2 WEEKS (Patient not taking: Reported on 03/05/2019)   No current facility-administered medications on file prior to visit.    Review of Systems  Constitutional: Negative for activity change, appetite change, chills, diaphoresis, fatigue and fever.  HENT: Negative for congestion and hearing  loss.   Eyes: Negative for visual disturbance.  Respiratory: Negative for cough, chest tightness, shortness of breath and wheezing.   Cardiovascular: Negative for chest pain, palpitations and leg swelling.  Gastrointestinal: Positive for abdominal pain (episodic, mild improving). Negative for anal bleeding, blood in stool, constipation, diarrhea, nausea and vomiting.  Endocrine: Negative for cold intolerance.  Genitourinary: Negative for difficulty urinating, dysuria, frequency and hematuria.  Musculoskeletal: Negative for arthralgias, back pain and neck pain.  Skin: Negative for rash.  Allergic/Immunologic: Negative for environmental allergies.  Neurological: Negative for dizziness, weakness, light-headedness, numbness and headaches.  Hematological: Negative for adenopathy.  Psychiatric/Behavioral: Negative for behavioral problems, dysphoric mood and sleep disturbance. The patient is not nervous/anxious.    Per HPI unless specifically indicated above     Objective:    BP 137/80   Pulse 85   Temp (!) 97.5 F (36.4 C)   Resp 16   Ht 5\' 5"  (1.651 m)   Wt (!) 315 lb 6.4 oz (143.1 kg)   SpO2 99%   BMI 52.49 kg/m   Wt Readings from Last 3 Encounters:  03/05/19 (!) 315 lb 6.4 oz (143.1 kg)  01/28/19 (!) 306 lb (138.8 kg)  01/17/19 (!) 305 lb (138.3 kg)    Physical  Exam Vitals and nursing note reviewed.  Constitutional:      General: She is not in acute distress.    Appearance: She is well-developed. She is obese. She is not diaphoretic.     Comments: Well-appearing, comfortable, cooperative  HENT:     Head: Normocephalic and atraumatic.  Eyes:     General:        Right eye: No discharge.        Left eye: No discharge.     Conjunctiva/sclera: Conjunctivae normal.     Pupils: Pupils are equal, round, and reactive to light.  Neck:     Thyroid: No thyromegaly.  Cardiovascular:     Rate and Rhythm: Normal rate and regular rhythm.     Heart sounds: Normal heart sounds. No murmur.  Pulmonary:     Effort: Pulmonary effort is normal. No respiratory distress.     Breath sounds: Normal breath sounds. No wheezing or rales.  Abdominal:     General: Bowel sounds are normal. There is no distension.     Palpations: Abdomen is soft. There is no mass.     Tenderness: There is no abdominal tenderness.  Musculoskeletal:        General: No tenderness. Normal range of motion.     Cervical back: Normal range of motion and neck supple.     Comments: Upper / Lower Extremities: - Normal muscle tone, strength bilateral upper extremities 5/5, lower extremities 5/5  Lymphadenopathy:     Cervical: No cervical adenopathy.  Skin:    General: Skin is warm and dry.     Findings: No erythema or rash.  Neurological:     Mental Status: She is alert and oriented to person, place, and time.     Comments: Distal sensation intact to light touch all extremities  Psychiatric:        Behavior: Behavior normal.     Comments: Well groomed, good eye contact, normal speech and thoughts      I have personally reviewed the radiology report from CT abdomen pelvis 02/14/19.   CLINICAL DATA:  59 year old female with pancreatitis suspected  EXAM: CT ABDOMEN AND PELVIS WITH CONTRAST  TECHNIQUE: Multidetector CT imaging of the abdomen and pelvis was performed  using the  standard protocol following bolus administration of intravenous contrast.  CONTRAST:  134mL OMNIPAQUE IOHEXOL 300 MG/ML  SOLN  COMPARISON:  None.  FINDINGS: Lower chest: No acute finding  Hepatobiliary: Unremarkable liver.  Unremarkable gallbladder.  Pancreas: Mild inflammatory changes/edema at the head of the pancreas without focal fluid. Uniform attenuation/enhancement of the pancreatic parenchyma with typical appearance of the parenchyma.  Spleen: Unremarkable  Adrenals/Urinary Tract:  - Right adrenal gland:  Unremarkable  - Left adrenal gland: Unremarkable.  - Right kidney: No hydronephrosis, nephrolithiasis, inflammation, or ureteral dilation. No focal lesion.  - Left Kidney: No hydronephrosis, nephrolithiasis, inflammation, or ureteral dilation. Rounded 15 mm lesion at the inferior aspect of the left kidney without enhancement or complexity compatible with Bosniak 1 cyst.  - Urinary Bladder: Unremarkable.  Stomach/Bowel:  - Stomach: Unremarkable.  - Small bowel: Unremarkable  - Appendix: Normal.  - Colon: Moderate stool burden. No evidence of obstruction. No inflammatory changes.  Vascular/Lymphatic: Unremarkable appearance of the abdominal vasculature.  There are borderline enlarged lymph nodes of the right iliac station, and the right inguinal nodal stations. The index node adjacent to the right iliac arteries measures 8 mm-9 mm on image 63 of series 2. There are additional retroperitoneal lymph nodes in the periaortic nodal station.  Reproductive: Hysterectomy.  Other: None  Musculoskeletal: No acute displaced fracture. Mild degenerative changes of the thoracolumbar spine.  IMPRESSION: Mild inflammatory changes adjacent to the pancreatic head, may indicate acute pancreatitis and correlation with lab values may be useful.  Nonspecific lymph nodes of the right greater than left inguinal nodes as well as along the right  iliac nodal stations and in the periaortic nodal stations. While these are not pathologically enlarged, they are conspicuous by number. Potentially reactive, correlation with any lower extremity infection/inflammation may be useful, as well as any history with lymphoproliferative disorder. In addition, confirming that the patient is up-to-date with health screening studies (colon, Ob/gyn, etc.) may also be useful.   Electronically Signed   By: Corrie Mckusick D.O.   On: 02/14/2019 09:41  Results for orders placed or performed during the hospital encounter of 01/17/19  Lipase, blood  Result Value Ref Range   Lipase 268 (H) 11 - 51 U/L  Comprehensive metabolic panel  Result Value Ref Range   Sodium 137 135 - 145 mmol/L   Potassium 3.6 3.5 - 5.1 mmol/L   Chloride 103 98 - 111 mmol/L   CO2 22 22 - 32 mmol/L   Glucose, Bld 118 (H) 70 - 99 mg/dL   BUN 16 6 - 20 mg/dL   Creatinine, Ser 0.65 0.44 - 1.00 mg/dL   Calcium 9.8 8.9 - 10.3 mg/dL   Total Protein 7.7 6.5 - 8.1 g/dL   Albumin 4.1 3.5 - 5.0 g/dL   AST 14 (L) 15 - 41 U/L   ALT 11 0 - 44 U/L   Alkaline Phosphatase 71 38 - 126 U/L   Total Bilirubin 0.6 0.3 - 1.2 mg/dL   GFR calc non Af Amer >60 >60 mL/min   GFR calc Af Amer >60 >60 mL/min   Anion gap 12 5 - 15  CBC  Result Value Ref Range   WBC 16.0 (H) 4.0 - 10.5 K/uL   RBC 4.71 3.87 - 5.11 MIL/uL   Hemoglobin 13.2 12.0 - 15.0 g/dL   HCT 40.4 36.0 - 46.0 %   MCV 85.8 80.0 - 100.0 fL   MCH 28.0 26.0 - 34.0 pg   MCHC 32.7 30.0 -  36.0 g/dL   RDW 13.0 11.5 - 15.5 %   Platelets 289 150 - 400 K/uL   nRBC 0.0 0.0 - 0.2 %  Urinalysis, Complete w Microscopic  Result Value Ref Range   Color, Urine YELLOW (A) YELLOW   APPearance HAZY (A) CLEAR   Specific Gravity, Urine 1.012 1.005 - 1.030   pH 6.0 5.0 - 8.0   Glucose, UA NEGATIVE NEGATIVE mg/dL   Hgb urine dipstick SMALL (A) NEGATIVE   Bilirubin Urine NEGATIVE NEGATIVE   Ketones, ur 20 (A) NEGATIVE mg/dL   Protein, ur  NEGATIVE NEGATIVE mg/dL   Nitrite NEGATIVE NEGATIVE   Leukocytes,Ua NEGATIVE NEGATIVE   RBC / HPF 0-5 0 - 5 RBC/hpf   WBC, UA 0-5 0 - 5 WBC/hpf   Bacteria, UA RARE (A) NONE SEEN   Squamous Epithelial / LPF 0-5 0 - 5   Mucus PRESENT   Lactate dehydrogenase  Result Value Ref Range   LDH 128 98 - 192 U/L      Assessment & Plan:   Problem List Items Addressed This Visit    Morbid obesity with BMI of 50.0-59.9, adult ([place])    Other Visit Diagnoses    Annual physical exam    -  Primary   Relevant Orders   Hemoglobin A1c   CBC with Differential/Platelet   COMPLETE METABOLIC PANEL WITH GFR   Lipid panel   Screen for colon cancer       Relevant Orders   Cologuard   Encounter for screening mammogram for malignant neoplasm of breast       Relevant Orders   MM DIGITAL SCREENING BILATERAL      Updated Health Maintenance information - Ordered mammogram screening, patient to call to schedule when ready - Ordered Cologuard, prior colonoscopy, now due for repeat screening Ordered fasting labs today CMET lipid CBC A1c Encouraged improvement to lifestyle with diet and exercise, resume keto diet and exercise regimen - Goal of weight loss   #Pancreatitis, acute - RESOLVING Reviewed last imaging, improved Reassurance No further oxycodone at this time.  No orders of the defined types were placed in this encounter.     Follow up plan: Return in about 6 months (around 09/02/2019) for 6 month follow-up.  [name], [facility] 03/05/2019, 9:43 AM

## 2019-03-05 NOTE — Patient Instructions (Addendum)
Thank you for coming to the office today.  Labs today, stay tuned for results.  --------  For Mammogram screening for breast cancer   Call the [facility] below anytime to schedule your own appointment now that order has been placed.  [facility] [place], [place] [address] Phone: [phone]  -------------------------------------------------------------------------   Colon Cancer Screening: - For all adults age 57+ routine colon cancer screening is highly recommended.     - Recent guidelines from [place] recommend starting age of 23 - Early detection of colon cancer is important, because often there are no warning signs or symptoms, also if found early usually it can be cured. Late stage is hard to treat.  - If you are not interested in Colonoscopy screening (if done and normal you could be cleared for 5 to 10 years until next due), then Cologuard is an excellent alternative for screening test for Colon Cancer. It is highly sensitive for detecting DNA of colon cancer from even the earliest stages. Also, there is NO bowel prep required. - If Cologuard is NEGATIVE, then it is good for 3 years before next due - If Cologuard is POSITIVE, then it is strongly advised to get a Colonoscopy, which allows the GI doctor to locate the source of the cancer or polyp (even very early stage) and treat it by removing it. ------------------------- If you would like to proceed with Cologuard (stool DNA test) - FIRST, call your insurance company and tell them you want to check cost of Cologuard tell them CPT Code [phone] (it may be completely covered and you could get for no cost, OR max cost without any coverage is about $600). Also, keep in mind if you do NOT open the kit, and decide not to do the test, you will NOT be charged, you should contact the company if you decide not to do the test. - If you want to proceed,  you can notify us (phone message, [place], or at next visit) and we will order it for you. The test kit will be delivered to you house within about 1 week. Follow instructions to collect sample, you may call the company for any help or questions, 24/7 telephone support at [phone].   Please schedule a Follow-up Appointment to: Return in about 6 months (around 09/02/2019) for 6 month follow-up.  If you have any other questions or concerns, please feel free to call the office or send a message through [place]. You may also schedule an earlier appointment if necessary.  Additionally, you may be receiving a survey about your experience at our office within a few days to 1 week by e-mail or mail. We value your feedback.  [name], DO [place]

## 2019-03-06 LAB — CBC WITH DIFFERENTIAL/PLATELET
Absolute Monocytes: 451 cells/uL (ref 200–950)
Basophils Absolute: 41 cells/uL (ref 0–200)
Basophils Relative: 0.5 %
Eosinophils Absolute: 221 cells/uL (ref 15–500)
Eosinophils Relative: 2.7 %
HCT: 37 % (ref 35.0–45.0)
Hemoglobin: 12.4 g/dL (ref 11.7–15.5)
Lymphs Abs: 2657 cells/uL (ref 850–3900)
MCH: 29 pg (ref 27.0–33.0)
MCHC: 33.5 g/dL (ref 32.0–36.0)
MCV: 86.7 fL (ref 80.0–100.0)
MPV: 10.8 fL (ref 7.5–12.5)
Monocytes Relative: 5.5 %
Neutro Abs: 4830 cells/uL (ref 1500–7800)
Neutrophils Relative %: 58.9 %
Platelets: 234 10*3/uL (ref 140–400)
RBC: 4.27 10*6/uL (ref 3.80–5.10)
RDW: 13.2 % (ref 11.0–15.0)
Total Lymphocyte: 32.4 %
WBC: 8.2 10*3/uL (ref 3.8–10.8)

## 2019-03-06 LAB — COMPLETE METABOLIC PANEL WITH GFR
AG Ratio: 1.4 (calc) (ref 1.0–2.5)
ALT: 11 U/L (ref 6–29)
AST: 12 U/L (ref 10–35)
Albumin: 4 g/dL (ref 3.6–5.1)
Alkaline phosphatase (APISO): 82 U/L (ref 37–153)
BUN: 21 mg/dL (ref 7–25)
CO2: 28 mmol/L (ref 20–32)
Calcium: 9.3 mg/dL (ref 8.6–10.4)
Chloride: 105 mmol/L (ref 98–110)
Creat: 0.63 mg/dL (ref 0.50–1.05)
GFR, Est African American: 115 mL/min/{1.73_m2} (ref >=60)
GFR, Est Non African American: 99 mL/min/{1.73_m2} (ref >=60)
Globulin: 2.8 g/dL (calc) (ref 1.9–3.7)
Glucose, Bld: 83 mg/dL (ref 65–99)
Potassium: 4.1 mmol/L (ref 3.5–5.3)
Sodium: 140 mmol/L (ref 135–146)
Total Bilirubin: 0.5 mg/dL (ref 0.2–1.2)
Total Protein: 6.8 g/dL (ref 6.1–8.1)

## 2019-03-06 LAB — HEMOGLOBIN A1C
Hgb A1c MFr Bld: 5.3 % of total Hgb (ref <5.7)
Mean Plasma Glucose: 105 (calc)
eAG (mmol/L): 5.8 (calc)

## 2019-03-06 LAB — LIPID PANEL
Cholesterol: 202 mg/dL — ABNORMAL HIGH (ref <200)
HDL: 74 mg/dL (ref >=50)
LDL Cholesterol (Calc): 111 mg/dL (calc) — ABNORMAL HIGH
Non-HDL Cholesterol (Calc): 128 mg/dL (calc) (ref <130)
Total CHOL/HDL Ratio: 2.7 (calc) (ref <5.0)
Triglycerides: 83 mg/dL (ref <150)

## 2019-03-11 ENCOUNTER — Telehealth: Payer: Self-pay | Admitting: Family Medicine

## 2019-03-11 NOTE — Telephone Encounter (Signed)
Patient advised.

## 2019-03-11 NOTE — Telephone Encounter (Signed)
Pt called for lab results 

## 2019-04-10 ENCOUNTER — Other Ambulatory Visit: Payer: Self-pay | Admitting: Family Medicine

## 2019-04-10 DIAGNOSIS — B372 Candidiasis of skin and nail: Secondary | ICD-10-CM

## 2019-04-10 DIAGNOSIS — S39012A Strain of muscle, fascia and tendon of lower back, initial encounter: Secondary | ICD-10-CM

## 2019-04-10 DIAGNOSIS — M62830 Muscle spasm of back: Secondary | ICD-10-CM

## 2019-04-10 DIAGNOSIS — I10 Essential (primary) hypertension: Secondary | ICD-10-CM

## 2019-04-10 DIAGNOSIS — I872 Venous insufficiency (chronic) (peripheral): Secondary | ICD-10-CM

## 2019-05-02 ENCOUNTER — Ambulatory Visit (INDEPENDENT_AMBULATORY_CARE_PROVIDER_SITE_OTHER): Payer: 59 | Admitting: Vascular Surgery

## 2019-05-10 ENCOUNTER — Ambulatory Visit: Payer: 59 | Admitting: Family Medicine

## 2019-05-13 ENCOUNTER — Telehealth: Payer: Self-pay | Admitting: Family Medicine

## 2019-05-13 ENCOUNTER — Ambulatory Visit: Payer: Self-pay | Admitting: Family Medicine

## 2019-05-13 NOTE — Telephone Encounter (Signed)
Copied from [place] [id number]. Topic: General - Other >> May 13, 2019  8:05 AM Celene Kras wrote: Reason for CRM: PT called to cancel her appt this morning. Pt states that she called on Friday to cancel it, but was not able to get in contact with anyone. Please advise.

## 2019-05-30 ENCOUNTER — Encounter: Payer: Self-pay | Admitting: Family Medicine

## 2019-05-30 DIAGNOSIS — E7800 Pure hypercholesterolemia, unspecified: Secondary | ICD-10-CM | POA: Insufficient documentation

## 2019-07-01 ENCOUNTER — Ambulatory Visit (INDEPENDENT_AMBULATORY_CARE_PROVIDER_SITE_OTHER): Payer: 59 | Admitting: Family Medicine

## 2019-07-01 ENCOUNTER — Other Ambulatory Visit: Payer: Self-pay

## 2019-07-01 ENCOUNTER — Encounter: Payer: Self-pay | Admitting: Family Medicine

## 2019-07-01 VITALS — BP 156/73 | HR 91 | Resp 16 | Ht 65.0 in | Wt 326.8 lb

## 2019-07-01 DIAGNOSIS — N760 Acute vaginitis: Secondary | ICD-10-CM

## 2019-07-01 DIAGNOSIS — B9689 Other specified bacterial agents as the cause of diseases classified elsewhere: Secondary | ICD-10-CM

## 2019-07-01 DIAGNOSIS — M545 Low back pain, unspecified: Secondary | ICD-10-CM

## 2019-07-01 DIAGNOSIS — B373 Candidiasis of vulva and vagina: Secondary | ICD-10-CM

## 2019-07-01 DIAGNOSIS — B3731 Acute candidiasis of vulva and vagina: Secondary | ICD-10-CM

## 2019-07-01 LAB — POCT URINALYSIS DIPSTICK
Bilirubin, UA: NEGATIVE
Blood, UA: NEGATIVE
Glucose, UA: NEGATIVE
Ketones, UA: NEGATIVE
Leukocytes, UA: NEGATIVE
Nitrite, UA: NEGATIVE
Protein, UA: NEGATIVE
Spec Grav, UA: 1.02 (ref 1.010–1.025)
Urobilinogen, UA: 0.2 E.U./dL
pH, UA: 5 (ref 5.0–8.0)

## 2019-07-01 MED ORDER — METRONIDAZOLE 500 MG PO TABS: 500.0000 mg | ORAL_TABLET | Freq: Two times a day (BID) | ORAL | 0 refills | Status: DC

## 2019-07-01 MED ORDER — NAPROXEN 500 MG PO TABS: 500.0000 mg | ORAL_TABLET | Freq: Two times a day (BID) | ORAL | 0 refills | Status: DC

## 2019-07-01 MED ORDER — FLUCONAZOLE 150 MG PO TABS: ORAL_TABLET | ORAL | 1 refills | Status: DC

## 2019-07-01 NOTE — Patient Instructions (Addendum)
Thank you for coming to the office today.  Start antibiotic for bacterial vaginosis  Start the diflucan yeast pill one first then skip 7 days and take other antibiotic then finish last pill.  Recommend trial of Anti-inflammatory with Naproxen (Naprosyn) 500mg  tabs - take one with food and plenty of water TWICE daily every day (breakfast and dinner), for next 2 to 4 weeks, then you may take only as needed - DO NOT TAKE any ibuprofen, aleve, motrin while you are taking this medicine - It is safe to take Tylenol Ext Str 500mg  tabs - take 1 to 2 (max dose 1000mg ) every 6 hours as needed for breakthrough pain, max 24 hour daily dose is 6 to 8 tablets or 4000mg    Please schedule a Follow-up Appointment to: Return in about 1 week (around 07/08/2019), or if symptoms worsen or fail to improve, for back pain, yeast.  If you have any other questions or concerns, please feel free to call the office or send a message through [place]. You may also schedule an earlier appointment if necessary.  Additionally, you may be receiving a survey about your experience at our office within a few days to 1 week by e-mail or mail. We value your feedback.  [name], DO [place]

## 2019-07-01 NOTE — Progress Notes (Signed)
Subjective:    Patient ID: Shannon Small, female    DOB: October 03, 1960, 59 y.o.   MRN: [id number]  Shannon Small is a 59 y.o. female presenting on 07/01/2019 for Back Pain (onset 4 days) and Urinary Tract Infection (onset 3 days urgency and frequency--itchiness )  Patient presents for a same day appointment.  HPI   BACK PAIN / UTI vs Vaginitis Reports onset with 3-4 days ago with some vaginal odor with some itching, she has some urinary incontinence with stress (laugh cry sneeze cough) and she wears pad, she has had some urinary odor. She now endorses low back pain across back, seems fairly constant, she said lifted heavy case of water 1.5 weeks ago, she has been taking Flexeril PRN limited relief. Has had prior back pain flares in past, none recently. No radiating pain down leg. - Tried Cranberry juice - Admits concern for yeast infection with vaginal discharge and itching and pelvic pain - History of prior abdominal pain with pancreatitis, but this is nothing like that episode. This is more lower  Admits some urinary urgency and frequency. She has had prior UTI and last urine culture was negative Denies any nausea vomiting, upper abdominal pain, dark stools, fever chills, blood in urine.   Depression screen [facility] 2/9 03/05/2019 01/28/2019 08/20/2018  Decreased Interest 0 0 0  Down, Depressed, Hopeless 0 0 0  PHQ - 2 Score 0 0 0  Altered sleeping 0 0 -  Tired, decreased energy 0 0 -  Change in appetite 0 0 -  Feeling bad or failure about yourself  0 0 -  Trouble concentrating 0 0 -  Moving slowly or fidgety/restless 0 0 -  Suicidal thoughts 0 0 -  PHQ-9 Score 0 0 -  Difficult doing work/chores Not difficult at all Not difficult at all -    Social History   Tobacco Use  . Smoking status: Never Smoker  . Smokeless tobacco: Never Used  Substance Use Topics  . Alcohol use: Yes    Alcohol/week: 0.0 standard drinks    Comment: rarely  . Drug use: No    Review of Systems Per HPI  unless specifically indicated above     Objective:    BP (!) 156/73   Pulse 91   Resp 16   Ht 5\' 5"  (1.651 m)   Wt (!) 326 lb 12.8 oz (148.2 kg)   SpO2 99%   BMI 54.38 kg/m   Wt Readings from Last 3 Encounters:  07/01/19 (!) 326 lb 12.8 oz (148.2 kg)  03/05/19 (!) 315 lb 6.4 oz (143.1 kg)  01/28/19 (!) 306 lb (138.8 kg)    Physical Exam Vitals and nursing note reviewed.  Constitutional:      General: She is not in acute distress.    Appearance: She is well-developed. She is obese. She is not diaphoretic.     Comments: Well-appearing, comfortable, cooperative  HENT:     Head: Normocephalic and atraumatic.  Eyes:     General:        Right eye: No discharge.        Left eye: No discharge.     Conjunctiva/sclera: Conjunctivae normal.  Cardiovascular:     Rate and Rhythm: Normal rate.  Pulmonary:     Effort: Pulmonary effort is normal.  Abdominal:     Palpations: Abdomen is soft.     Tenderness: There is no abdominal tenderness (not reproducible).  Skin:    General: Skin is warm and  dry.     Findings: No erythema or rash.  Neurological:     Mental Status: She is alert and oriented to person, place, and time.  Psychiatric:        Behavior: Behavior normal.     Comments: Well groomed, good eye contact, normal speech and thoughts        Results for orders placed or performed in visit on 07/01/19  POCT Urinalysis Dipstick  Result Value Ref Range   Color, UA amber    Clarity, UA clear    Glucose, UA Negative Negative   Bilirubin, UA Negative    Ketones, UA Negative    Spec Grav, UA 1.020 1.010 - 1.025   Blood, UA Negative    pH, UA 5.0 5.0 - 8.0   Protein, UA Negative Negative   Urobilinogen, UA 0.2 0.2 or 1.0 E.U./dL   Nitrite, UA Negative    Leukocytes, UA Negative Negative   Appearance     Odor        Assessment & Plan:   Problem List Items Addressed This Visit    Low back pain - Primary   Relevant Medications   naproxen (NAPROSYN) 500 MG tablet    Other Relevant Orders   POCT Urinalysis Dipstick (Completed)   Urine Culture    Other Visit Diagnoses    BV (bacterial vaginosis)       Relevant Medications   fluconazole (DIFLUCAN) 150 MG tablet   metroNIDAZOLE (FLAGYL) 500 MG tablet   Yeast vaginitis       Relevant Medications   fluconazole (DIFLUCAN) 150 MG tablet   metroNIDAZOLE (FLAGYL) 500 MG tablet      Acute Low Back pain bilateral, without radiation Possible lifting injury 1.5 week ago, limited improvement on conservative therapy Also associated with variety of urinary symptoms frequency/urgency and vaginal odor discharge itching  Prior pancreatitis, but history not suggestive, not related to eating and no upper abdominal pain.  Urine dipstick negative today Add Urine culture to rule out UTI given mixture of symptoms Empiric BV coverage Metronidazole 7 days, and add Diflucan as well as she has needed in past for yeast  Additionally for back and pelvic pain, add NSAID Naproxen 500 BID x 1-2 weeks, continue Flexeril, Tylenol PRN Follow-up if not improving  Meds ordered this encounter  Medications  . fluconazole (DIFLUCAN) 150 MG tablet    Sig: Take one tablet by mouth on Day 1. Repeat dose 2nd tablet on Day 3.    Dispense:  2 tablet    Refill:  1  . metroNIDAZOLE (FLAGYL) 500 MG tablet    Sig: Take 1 tablet (500 mg total) by mouth 2 (two) times daily. Do not drink alcohol while taking this medicine.    Dispense:  14 tablet    Refill:  0  . naproxen (NAPROSYN) 500 MG tablet    Sig: Take 1 tablet (500 mg total) by mouth 2 (two) times daily with a meal. For 2-4 weeks then as needed    Dispense:  60 tablet    Refill:  0      Follow up plan: Return in about 1 week (around 07/08/2019), or if symptoms worsen or fail to improve, for back pain, yeast.   [name], DO [facility] 07/01/2019, 9:41 AM

## 2019-07-03 LAB — URINE CULTURE
MICRO NUMBER:: 10617516
SPECIMEN QUALITY:: ADEQUATE

## 2019-08-01 ENCOUNTER — Telehealth: Payer: Self-pay

## 2019-08-01 ENCOUNTER — Telehealth: Payer: Self-pay | Admitting: Family Medicine

## 2019-08-01 DIAGNOSIS — M62830 Muscle spasm of back: Secondary | ICD-10-CM

## 2019-08-01 DIAGNOSIS — L299 Pruritus, unspecified: Secondary | ICD-10-CM

## 2019-08-01 DIAGNOSIS — B3731 Acute candidiasis of vulva and vagina: Secondary | ICD-10-CM

## 2019-08-01 DIAGNOSIS — S39012A Strain of muscle, fascia and tendon of lower back, initial encounter: Secondary | ICD-10-CM

## 2019-08-01 MED ORDER — FLUCONAZOLE 150 MG PO TABS: ORAL_TABLET | ORAL | 1 refills | Status: DC

## 2019-08-01 NOTE — Telephone Encounter (Signed)
Copied from [place] [id number]. Topic: General - Other >> Aug 01, 2019 12:08 PM [name] wrote: Patient is requesting a refill on an "itch pill" that's she has been prescribed before. Patient stated that she does not know the name of the medication at all. Advised patient that we can check with Dr. Marthann Schiller nurse to verify medication she is referring too and have sent to patients pharmacy. Please advise

## 2019-08-01 NOTE — Telephone Encounter (Signed)
Reviewed the chart. Yes that is likely what she is requesting. She was treated 1 month ago, I will go ahead and send refill Diflucan.  [name], [facility] 08/01/2019, 6:00 PM

## 2019-08-01 NOTE — Telephone Encounter (Signed)
Requested medication (s) are due for refill today - yes  Requested medication (s) are on the active medication list -yes  Future visit scheduled -no  Last refill: 06/13/19  Notes to clinic: Request for non delegated Rx  Requested Prescriptions  Pending Prescriptions Disp Refills   orphenadrine (NORFLEX) 100 MG tablet [Pharmacy Med Name: Orphenadrine Citrate ER 100 MG Oral Tablet Extended Release 12 Hour] 60 tablet 0    Sig: TAKE 1 TABLET BY MOUTH TWICE DAILY AS NEEDED FOR MUSCLE SPASM      Not Delegated - Analgesics:  Muscle Relaxants Failed - 08/01/2019 12:13 PM      Failed - This refill cannot be delegated      Passed - Valid encounter within last 6 months    Recent Outpatient Visits           1 month ago Acute bilateral low back pain without sciatica   [place], [place]   4 months ago Annual physical exam   [place], DO   6 months ago Idiopathic acute pancreatitis without infection or necrosis   [facility] Olin Hauser, DO   11 months ago Acute venous stasis dermatitis of right lower extremity   [facility] Olin Hauser, DO   1 year ago Acute non-recurrent maxillary sinusitis   [place], DO               Refused Prescriptions Disp Refills   hydrOXYzine (ATARAX/VISTARIL) 25 MG tablet [Pharmacy Med Name: hydrOXYzine HCl 25 MG Oral Tablet] 30 tablet 0    Sig: TAKE 1 TO 2 TABLETS BY MOUTH EVERY 8 HOURS AS NEEDED FOR ITCHING      Ear, Nose, and Throat:  Antihistamines Passed - 08/01/2019 12:13 PM      Passed - Valid encounter within last 12 months    Recent Outpatient Visits           1 month ago Acute bilateral low back pain without sciatica   [facility], [place]   4 months ago Annual physical exam   [place], DO   6 months ago Idiopathic acute pancreatitis without infection or necrosis   [facility] Olin Hauser, DO   11 months ago Acute venous stasis dermatitis of right lower extremity   [facility] Olin Hauser, DO   1 year ago Acute non-recurrent maxillary sinusitis   [place], DO                  Requested Prescriptions  Pending Prescriptions Disp Refills   orphenadrine (NORFLEX) 100 MG tablet [Pharmacy Med Name: Orphenadrine Citrate ER 100 MG Oral Tablet Extended Release 12 Hour] 60 tablet 0    Sig: TAKE 1 TABLET BY MOUTH TWICE DAILY AS NEEDED FOR MUSCLE SPASM      Not Delegated - Analgesics:  Muscle Relaxants Failed - 08/01/2019 12:13 PM      Failed - This refill cannot be delegated      Passed - Valid encounter within last 6 months    Recent Outpatient Visits           1 month ago Acute bilateral low back pain without sciatica   [place], DO   4 months ago Annual physical exam  [facility], Devonne Doughty, DO   6 months ago Idiopathic acute pancreatitis without infection or necrosis   [place], [place]   11 months ago Acute venous stasis dermatitis of right lower extremity   [place], DO   1 year ago Acute non-recurrent maxillary sinusitis   [place], DO               Refused Prescriptions Disp Refills   hydrOXYzine (ATARAX/VISTARIL) 25 MG tablet [Pharmacy Med Name: hydrOXYzine HCl 25 MG Oral Tablet] 30 tablet 0    Sig: TAKE 1 TO 2 TABLETS BY MOUTH EVERY 8 HOURS AS NEEDED FOR ITCHING      Ear, Nose, and Throat:  Antihistamines Passed - 08/01/2019 12:13 PM      Passed - Valid encounter within last 12 months    Recent Outpatient Visits           1 month ago Acute  bilateral low back pain without sciatica   [place], DO   4 months ago Annual physical exam   [place], DO   6 months ago Idiopathic acute pancreatitis without infection or necrosis   [place], DO   11 months ago Acute venous stasis dermatitis of right lower extremity   [place], DO   1 year ago Acute non-recurrent maxillary sinusitis   [place], DO

## 2019-08-01 NOTE — Telephone Encounter (Signed)
Unable to reach the patient I think she is talking about Diflucan which was Rx on 06/21--let me know if she needs apt ?

## 2019-08-02 MED ORDER — HYDROXYZINE HCL 25 MG PO TABS: 25.0000 mg | ORAL_TABLET | Freq: Three times a day (TID) | ORAL | 2 refills | Status: DC | PRN

## 2019-08-02 NOTE — Telephone Encounter (Signed)
Pt went by the pharmacy and was told the hydroxyzine was not approved.  Pharmacy told her it was not on her current list.  Patient wants to know why she cant get this medication

## 2019-08-02 NOTE — Addendum Note (Signed)
Addended by: Olin Hauser on: 08/02/2019 05:04 PM   Modules accepted: Orders

## 2019-08-02 NOTE — Telephone Encounter (Signed)
Pt called very upset because she states that the pharmacy told her this medication was denied and that she could not receive it. Please advise.

## 2019-08-02 NOTE — Telephone Encounter (Signed)
Pt called back to find out the message below, as she los reception yesterday. Advised pt the diflucan had been sent in my Dr Raliegh Ip.  Pt verbalized understanding.

## 2019-08-02 NOTE — Telephone Encounter (Signed)
Last Rx was send in 08/2018, confirmed with her if she needed this one or Diflucan since her connection was bad and could not able to talk to the patient yesterday.

## 2019-08-02 NOTE — Telephone Encounter (Signed)
Pt calling to ask if her meds can be sent in this morning.  She lives in [place] and would like to make only one trip.

## 2019-08-08 ENCOUNTER — Other Ambulatory Visit: Payer: Self-pay | Admitting: Family Medicine

## 2019-08-08 MED ORDER — FLUTICASONE PROPIONATE 50 MCG/ACT NA SUSP: 2.0000 | Freq: Every day | NASAL | 6 refills | Status: DC

## 2019-08-13 ENCOUNTER — Other Ambulatory Visit: Payer: Self-pay

## 2019-08-13 ENCOUNTER — Ambulatory Visit (INDEPENDENT_AMBULATORY_CARE_PROVIDER_SITE_OTHER): Payer: 59 | Admitting: Family Medicine

## 2019-08-13 ENCOUNTER — Encounter: Payer: Self-pay | Admitting: Family Medicine

## 2019-08-13 VITALS — BP 134/87 | HR 51 | Temp 98.1°F | Resp 16 | Ht 65.0 in | Wt 329.0 lb

## 2019-08-13 DIAGNOSIS — J0100 Acute maxillary sinusitis, unspecified: Secondary | ICD-10-CM

## 2019-08-13 DIAGNOSIS — J9801 Acute bronchospasm: Secondary | ICD-10-CM | POA: Diagnosis not present

## 2019-08-13 MED ORDER — PREDNISONE 10 MG PO TABS: ORAL_TABLET | ORAL | 0 refills | Status: DC

## 2019-08-13 MED ORDER — LEVOFLOXACIN 500 MG PO TABS: 500.0000 mg | ORAL_TABLET | Freq: Every day | ORAL | 0 refills | Status: DC

## 2019-08-13 MED ORDER — ALBUTEROL SULFATE HFA 108 (90 BASE) MCG/ACT IN AERS: 2.0000 | INHALATION_SPRAY | RESPIRATORY_TRACT | 2 refills | Status: DC | PRN

## 2019-08-13 NOTE — Patient Instructions (Addendum)
Thank you for coming to the office today.   1. It sounds like you have a Sinusitis (Bacterial Infection) - this most likely started as an Upper Respiratory Virus that has settled into an infection. Allergies can also cause this. - Start taking Levaquin antibiotic 500mg  daily x 7 days - Use prednisone taper - use albuterol rescue inhaler Start nasal steroid Flonase 2 sprays in each nostril daily for 4-6 weeks, may repeat course seasonally or as needed - Recommend to keep using Nasal Saline spray multiple times a day to help flush out congestion and clear sinuses - Improve hydration by drinking plenty of clear fluids (water, gatorade) to reduce secretions and thin congestion - Congestion draining down throat can cause irritation. May try warm herbal tea with honey, cough drops - Can take Tylenol or Ibuprofen as needed for fevers - May continue over the counter cold medicine as you are, I would not use any decongestant or mucinex longer than 7 days.  If you develop persistent fever >101F for at least 3 consecutive days, headaches with sinus pain or pressure or persistent earache, please schedule a follow-up evaluation within next few days to week.    Please schedule a Follow-up Appointment to: Return in about 1 week (around 08/20/2019), or if symptoms worsen or fail to improve, for sinusitis, bronchospasm.  If you have any other questions or concerns, please feel free to call the office or send a message through [place]. You may also schedule an earlier appointment if necessary.  Additionally, you may be receiving a survey about your experience at our office within a few days to 1 week by e-mail or mail. We value your feedback.  Shannon Putnam, DO [place]

## 2019-08-13 NOTE — Progress Notes (Signed)
Subjective:    Patient ID: Shannon Small, female    DOB: 12/18/1960, 59 y.o.   MRN: [id number]  Shannon Small is a 59 y.o. female presenting on 08/13/2019 for Shortness of Breath (cough, sinusitis, chest congestion, runny nose little, HA onset week and half and breathing 3 weeks )  Patient presents for a same day appointment.  HPI   Sinusitis / Cough Bronchospasm Reports onset 1-3 weeks of sinus congestion drainage and pain, had improved then worse again. In past has required antibiotics, has had lower respiratory issues related to sinuses as well, tried OTC limited relief. Additionally with obesity and dyspnea by her report, worse on exertion at times. Denies swelling, nausea vomiting, chest pain  UTD COVID19 vaccine  Depression screen [facility] 2/9 03/05/2019 01/28/2019 08/20/2018  Decreased Interest 0 0 0  Down, Depressed, Hopeless 0 0 0  PHQ - 2 Score 0 0 0  Altered sleeping 0 0 -  Tired, decreased energy 0 0 -  Change in appetite 0 0 -  Feeling bad or failure about yourself  0 0 -  Trouble concentrating 0 0 -  Moving slowly or fidgety/restless 0 0 -  Suicidal thoughts 0 0 -  PHQ-9 Score 0 0 -  Difficult doing work/chores Not difficult at all Not difficult at all -    Social History   Tobacco Use   Smoking status: Never Smoker   Smokeless tobacco: Never Used  Substance Use Topics   Alcohol use: Yes    Alcohol/week: 0.0 standard drinks    Comment: rarely   Drug use: No    Review of Systems Per HPI unless specifically indicated above     Objective:    BP 134/87    Pulse (!) 51 Comment: pulse was ranges from 40-55   Temp 98.1 F (36.7 C) (Temporal)    Resp 16    Ht 5\' 5"  (1.651 m)    Wt (!) 329 lb (149.2 kg)    SpO2 100%    BMI 54.75 kg/m   Wt Readings from Last 3 Encounters:  08/13/19 (!) 329 lb (149.2 kg)  07/01/19 (!) 326 lb 12.8 oz (148.2 kg)  03/05/19 (!) 315 lb 6.4 oz (143.1 kg)    Physical Exam Vitals and nursing note reviewed.  Constitutional:       General: She is not in acute distress.    Appearance: She is well-developed. She is obese. She is not diaphoretic.     Comments: Well-appearing, comfortable, cooperative  HENT:     Head: Normocephalic and atraumatic.  Eyes:     General:        Right eye: No discharge.        Left eye: No discharge.     Conjunctiva/sclera: Conjunctivae normal.  Neck:     Thyroid: No thyromegaly.  Cardiovascular:     Rate and Rhythm: Normal rate and regular rhythm.     Heart sounds: Normal heart sounds. No murmur heard.   Pulmonary:     Effort: Pulmonary effort is normal. No respiratory distress.     Breath sounds: Examination of the right-upper field reveals wheezing. Examination of the left-upper field reveals wheezing. Examination of the right-lower field reveals decreased breath sounds. Examination of the left-lower field reveals decreased breath sounds. Decreased breath sounds and wheezing present. No rales.  Musculoskeletal:        General: Normal range of motion.     Cervical back: Normal range of motion and neck supple.  Lymphadenopathy:  Cervical: No cervical adenopathy.  Skin:    General: Skin is warm and dry.     Findings: No erythema or rash.  Neurological:     Mental Status: She is alert and oriented to person, place, and time.  Psychiatric:        Behavior: Behavior normal.     Comments: Well groomed, good eye contact, normal speech and thoughts    Results for orders placed or performed in visit on 07/01/19  Urine Culture   Specimen: Urine  Result Value Ref Range   MICRO NUMBER: [id number]    SPECIMEN QUALITY: Adequate    Sample Source URINE, CLEAN CATCH    STATUS: FINAL    ISOLATE 1:      Growth of mixed flora was isolated, suggesting probable contamination. No further testing will be performed. If clinically indicated, recollection using a method to minimize contamination, with prompt transfer to Urine Culture Transport Tube, is  recommended.   POCT Urinalysis Dipstick    Result Value Ref Range   Color, UA amber    Clarity, UA clear    Glucose, UA Negative Negative   Bilirubin, UA Negative    Ketones, UA Negative    Spec Grav, UA 1.020 1.010 - 1.025   Blood, UA Negative    pH, UA 5.0 5.0 - 8.0   Protein, UA Negative Negative   Urobilinogen, UA 0.2 0.2 or 1.0 E.U./dL   Nitrite, UA Negative    Leukocytes, UA Negative Negative   Appearance     Odor        Assessment & Plan:   Problem List Items Addressed This Visit    None    Visit Diagnoses    Acute non-recurrent maxillary sinusitis    -  Primary   Relevant Medications   levofloxacin (LEVAQUIN) 500 MG tablet   predniSONE (DELTASONE) 10 MG tablet   Cough due to bronchospasm       Relevant Medications   albuterol (VENTOLIN HFA) 108 (90 Base) MCG/ACT inhaler   predniSONE (DELTASONE) 10 MG tablet      Consistent with acute sinusitis, likely initially viral URI vs allergic rhinitis component with worsening concern for bacterial infection.   Additionally with bronchospasm, no focal lung abnormality  Plan: 1. Start taking Levaquin antibiotic 500mg  daily x 7 days - in past has had recurrent sinusitis has failed augmentin therapy before, often has associated lower respiratory symptoms as well 2. Start prednisone 50mg  daily x 5 days for bronchospasm 3. Re order rescue inhaler albuterol PRN Keep on OTC supportive meds decongestants  Defer CXR today, if not improve can check  Return criteria reviewed  Future may consider ENT if recurrence again   Meds ordered this encounter  Medications   levofloxacin (LEVAQUIN) 500 MG tablet    Sig: Take 1 tablet (500 mg total) by mouth daily. For 7 days    Dispense:  7 tablet    Refill:  0   albuterol (VENTOLIN HFA) 108 (90 Base) MCG/ACT inhaler    Sig: Inhale 2 puffs into the lungs every 4 (four) hours as needed for wheezing or shortness of breath (cough).    Dispense:  6.7 g    Refill:  2   predniSONE (DELTASONE) 10 MG tablet    Sig: Take 6 tabs  with breakfast Day 1, 5 tabs Day 2, 4 tabs Day 3, 3 tabs Day 4, 2 tabs Day 5, 1 tab Day 6.    Dispense:  21 tablet    Refill:  0      Follow up plan: Return in about 1 week (around 08/20/2019), or if symptoms worsen or fail to improve, for sinusitis, bronchospasm.    [name], [facility] 08/13/2019, 2:01 PM

## 2019-08-27 ENCOUNTER — Other Ambulatory Visit: Payer: Self-pay | Admitting: Family Medicine

## 2019-08-27 DIAGNOSIS — M62830 Muscle spasm of back: Secondary | ICD-10-CM

## 2019-08-27 DIAGNOSIS — S39012A Strain of muscle, fascia and tendon of lower back, initial encounter: Secondary | ICD-10-CM

## 2019-08-27 NOTE — Telephone Encounter (Signed)
Requested medication (s) are due for refill today: Yes  Requested medication (s) are on the active medication list: Yes  Last refill:  09/19/18  Future visit scheduled: No  Notes to clinic:  Unable to refill per protocol cannot delegate     Requested Prescriptions  Pending Prescriptions Disp Refills   orphenadrine (NORFLEX) 100 MG tablet [Pharmacy Med Name: Orphenadrine Citrate ER 100 MG Oral Tablet Extended Release 12 Hour] 60 tablet 0    Sig: TAKE 1 TABLET BY MOUTH TWICE DAILY AS NEEDED FOR MUSCLE SPASM      Not Delegated - Analgesics:  Muscle Relaxants Failed - 08/27/2019  4:08 PM      Failed - This refill cannot be delegated      Passed - Valid encounter within last 6 months    Recent Outpatient Visits           2 weeks ago Acute non-recurrent maxillary sinusitis   [facility] O[facility], DO   1 month ago Acute bilateral low back pain without sciatica   [place], DO   5 months ago Annual physical exam   [facility], Devonne Doughty, DO   7 months ago Idiopathic acute pancreatitis without infection or necrosis   [place], DO   1 year ago Acute venous stasis dermatitis of right lower extremity   [place], Devonne Doughty, DO

## 2019-10-02 ENCOUNTER — Telehealth: Payer: 59 | Admitting: Family Medicine

## 2019-10-02 ENCOUNTER — Telehealth: Payer: Self-pay

## 2019-10-02 NOTE — Telephone Encounter (Signed)
I called the patient about her virtual visit scheduled for today. The patient informed me that she informed nurse on yesterday to cancel this appt because she went to the urgent care for her issue.

## 2019-10-11 ENCOUNTER — Telehealth: Payer: Self-pay | Admitting: Family Medicine

## 2019-10-11 ENCOUNTER — Ambulatory Visit: Payer: Self-pay

## 2019-10-11 ENCOUNTER — Encounter: Payer: Self-pay | Admitting: Family Medicine

## 2019-10-11 ENCOUNTER — Telehealth (INDEPENDENT_AMBULATORY_CARE_PROVIDER_SITE_OTHER): Payer: 59 | Admitting: Family Medicine

## 2019-10-11 ENCOUNTER — Other Ambulatory Visit: Payer: Self-pay

## 2019-10-11 DIAGNOSIS — J9801 Acute bronchospasm: Secondary | ICD-10-CM

## 2019-10-11 DIAGNOSIS — U071 COVID-19: Secondary | ICD-10-CM

## 2019-10-11 MED ORDER — GUAIFENESIN-CODEINE 100-10 MG/5ML PO SYRP: 5.0000 mL | ORAL_SOLUTION | Freq: Four times a day (QID) | ORAL | 0 refills | Status: DC | PRN

## 2019-10-11 MED ORDER — BENZONATATE 200 MG PO CAPS: 200.0000 mg | ORAL_CAPSULE | Freq: Three times a day (TID) | ORAL | 0 refills | Status: DC | PRN

## 2019-10-11 MED ORDER — ALBUTEROL SULFATE HFA 108 (90 BASE) MCG/ACT IN AERS: 2.0000 | INHALATION_SPRAY | RESPIRATORY_TRACT | 2 refills | Status: DC | PRN

## 2019-10-11 MED ORDER — PREDNISONE 20 MG PO TABS: ORAL_TABLET | ORAL | 0 refills | Status: DC

## 2019-10-11 NOTE — Telephone Encounter (Signed)
Pt. Reports she was treated last week for bronchitis at a walk in clinic and then this week tested positive for COVID 19. Has laryngitis, productive cough, fatigue. Virtual visit for today. Reason for Disposition . [1] Fever > 100.0 F (37.8 C) AND [2] bedridden (e.g., nursing home patient, CVA, chronic illness, recovering from surgery)  Answer Assessment - Initial Assessment Questions 1. COVID-19 DIAGNOSIS: "Who made your Coronavirus (COVID-19) diagnosis?" "Was it confirmed by a positive lab test?" If not diagnosed by a HCP, ask "Are there lots of cases (community spread) where you live?" (See public health department website, if unsure)     Yes 2. COVID-19 EXPOSURE: "Was there any known exposure to [place] before the symptoms began?" CDC Definition of close contact: within 6 feet (2 meters) for a total of 15 minutes or more over a 24-hour period.      No 3. ONSET: "When did the COVID-19 symptoms start?"      Last week 4. WORST SYMPTOM: "What is your worst symptom?" (e.g., cough, fever, shortness of breath, muscle aches)     Cough 5. COUGH: "Do you have a cough?" If Yes, ask: "How bad is the cough?"       Yes 6. FEVER: "Do you have a fever?" If Yes, ask: "What is your temperature, how was it measured, and when did it start?"     No 7. RESPIRATORY STATUS: "Describe your breathing?" (e.g., shortness of breath, wheezing, unable to speak)      No 8. BETTER-SAME-WORSE: "Are you getting better, staying the same or getting worse compared to yesterday?"  If getting worse, ask, "In what way?"     Same 9. HIGH RISK DISEASE: "Do you have any chronic medical problems?" (e.g., asthma, heart or lung disease, weak immune system, obesity, etc.)     No 10. PREGNANCY: "Is there any chance you are pregnant?" "When was your last menstrual period?"       No 11. OTHER SYMPTOMS: "Do you have any other symptoms?"  (e.g., chills, fatigue, headache, loss of smell or taste, muscle pain, sore throat; new loss of smell or  taste especially support the diagnosis of COVID-19)       Fatigue  Protocols used: CORONAVIRUS (COVID-19) DIAGNOSED OR SUSPECTED-A-AH

## 2019-10-11 NOTE — Patient Instructions (Signed)
1. Start Prednisone 7 day course taper 2. Re order Tessalon 3. Order Virtussin cough syrup 4. Re order Albuterol PRN 5. No further antibiotics, already had Augmentin from Urgent Care  No orders of the defined types were placed in this encounter.   REQUIRED self quarantine to [place] - advised to avoid all exposure with others while during treatment. Should continue to quarantine for up to 10 days - pending resolution of symptoms   If symptoms do not resolve or significantly improve OR if WORSENING - fever / cough - or worsening shortness of breath - then should contact us and seek advice on next steps in treatment at home vs where/when to seek care at Urgent Care or Hospital ED for further intervention and possible testing if indicated.  Please schedule a Follow-up Appointment to: No follow-ups on file.  If you have any other questions or concerns, please feel free to call the office or send a message through [place]. You may also schedule an earlier appointment if necessary.  Additionally, you may be receiving a survey about your experience at our office within a few days to 1 week by e-mail or mail. We value your feedback.  [name], DO [place]

## 2019-10-11 NOTE — Progress Notes (Signed)
Virtual Visit via Telephone The purpose of this virtual visit is to provide medical care while limiting exposure to the novel coronavirus (COVID19) for both patient and office staff.  Consent was obtained for phone visit:  Yes.   Answered questions that patient had about telehealth interaction:  Yes.   I discussed the limitations, risks, security and privacy concerns of performing an evaluation and management service by telephone. I also discussed with the patient that there may be a patient responsible charge related to this service. The patient expressed understanding and agreed to proceed.  Patient Location: Home Provider Location: [facility])   ---------------------------------------------------------------------- Chief Complaint  Patient presents with  . Covid Positive    tested positive Tuesday  . Bronchitis    went to [facility] Rx Abx     S: Reviewed CMA documentation. I have called patient and gathered additional HPI as follows:  COVID POSITIVE Reports that symptoms started within 1 week, on Tues 9/28 confirmed positive test She went to [facility] given augmentin, cough syrup, tessalon, prednisone Admits hoarse voice, coughing  Denies any fevers, chills, sweats, body ache, cough, shortness of breath, sinus pain or pressure, headache, abdominal pain, diarrhea  -------------------------------------------------------------------------- O: No physical exam performed due to remote telephone encounter.  -------------------------------------------------------------------------- A&P:  [id number] POSITIVE Uncomplicated Laryngitis No dyspnea or fever  1. Start Prednisone 7 day course taper 2. Re order Tessalon 3. Order Virtussin cough syrup 4. Re order Albuterol PRN 5. No further antibiotics, already had Augmentin from Urgent Care  Meds ordered this encounter  Medications  . predniSONE (DELTASONE) 20 MG tablet    Sig: Take daily with food.  Start with 60mg  (3 pills) x 2 days, then reduce to 40mg  (2 pills) x 2 days, then 20mg  (1 pill) x 3 days    Dispense:  13 tablet    Refill:  0  . benzonatate (TESSALON) 200 MG capsule    Sig: Take 1 capsule (200 mg total) by mouth 3 (three) times daily as needed.    Dispense:  30 capsule    Refill:  0  . albuterol (VENTOLIN HFA) 108 (90 Base) MCG/ACT inhaler    Sig: Inhale 2 puffs into the lungs every 4 (four) hours as needed for wheezing or shortness of breath (cough).    Dispense:  6.7 g    Refill:  2  . guaiFENesin-codeine (ROBITUSSIN AC) 100-10 MG/5ML syrup    Sig: Take 5-10 mLs by mouth 4 (four) times daily as needed for cough.    Dispense:  180 mL    Refill:  0    REQUIRED self quarantine to [place] - advised to avoid all exposure with others while during treatment. Should continue to quarantine for up to 10 days - pending resolution of symptoms   If symptoms do not resolve or significantly improve OR if WORSENING - fever / cough - or worsening shortness of breath - then should contact us and seek advice on next steps in treatment at home vs where/when to seek care at Urgent Care or Hospital ED for further intervention and possible testing if indicated.  Patient verbalizes understanding with the above medical recommendations including the limitation of remote medical advice.  Specific follow-up / call-back criteria were given for patient to follow-up or seek medical care more urgently if needed.   - Time spent in direct consultation with patient on phone: 15 minutes   Shannon Small, [facility]  10/11/2019, 9:24 AM

## 2019-10-14 ENCOUNTER — Ambulatory Visit: Payer: Self-pay

## 2019-10-14 NOTE — Telephone Encounter (Signed)
Pt. Reports she is feeling "some better, but still coughing stuff up.Why is taking me so long to get over this?" States she is going back to UC for "a check up.I have to go back to work this weekend." Wants PCP to know.

## 2019-10-23 ENCOUNTER — Ambulatory Visit: Payer: Self-pay | Admitting: *Deleted

## 2019-10-23 NOTE — Telephone Encounter (Signed)
C/o increasing chest/ ribs/ back pain due to continued coughing from recovering from covid. + covid 10/07/19 and patient reports she is still has productive cough clear to yellow mucus. Prescribed tessalon, not helping. Patient out of codeine robitussin. Voice very hoarse and patient reports she is still testing positive for covid and has been denied to go back to work. Denies SOB, fever, loss of taste/smell. Attempted to set up PCR testing and patient declined due to drive time to get tested. Patient concerned she is not recovering from covid and is missing work and now has significant back pain due to persistent cough. Virtual appt made 10/25/19. Care advise given. Patient verbalized understanding of care advise and to call back if symptoms worsen.  Please advise if appt can be OV or earlier than 10/25/19.  Reason for Disposition  [1] PERSISTING SYMPTOMS OF COVID-19 AND [2] NO medical visit for COVID-19 in past 2 weeks  Answer Assessment - Initial Assessment Questions 1. COVID-19 ONSET: "When did the symptoms of COVID-19 first start?"     10/01/19 2. DIAGNOSIS CONFIRMATION: "How were you diagnosed?" (e.g., COVID-19 oral or nasal viral test; COVID-19 antibody test; doctor visit)     Rapid test  3. MAIN SYMPTOM:  "What is your main concern or symptom right now?" (e.g., breathing difficulty, cough, fatigue. loss of smell)     Continued cough and lost voice, back pain due to coughing 4. SYMPTOM ONSET: "When did the  Back pain  start?"     na 5. BETTER-SAME-WORSE: "Are you getting better, staying the same, or getting worse over the last 1 to 2 weeks?"     Back pain is worse 6. RECENT MEDICAL VISIT: "Have you been seen by a healthcare provider (doctor, NP, PA) for these persisting COVID-19 symptoms?" If Yes, ask: "When were you seen?" (e.g., date)     Yes remains on amoxicillin and tessalon  7. COUGH: "Do you have a cough?" If Yes, ask: "How bad is the cough?"       Yes productive cough mucus clear to  yellow in color. 8. FEVER: "Do you have a fever?" If Yes, ask: "What is your temperature, how was it measured, and when did it start?" No       9. BREATHING DIFFICULTY: "Are you having any trouble breathing?" If Yes, ask: "How bad is your breathing?" (e.g., mild, moderate, severe)    - MILD: No SOB at rest, mild SOB with walking, speaks normally in sentences, can lay down, no retractions, pulse < 100.    - MODERATE: SOB at rest, SOB with minimal exertion and prefers to sit, cannot lie down flat, speaks in phrases, mild retractions, audible wheezing, pulse 100-120.    - SEVERE: Very SOB at rest, speaks in single words, struggling to breathe, sitting hunched forward, retractions, pulse > 120      No breathing difficulty  10. HIGH RISK DISEASE: "Do you have any chronic medical problems?" (e.g., asthma, heart or lung disease, weak immune system, obesity, etc.)       bronchitis 11. PREGNANCY: "Is there any chance you are pregnant?" "When was your last menstrual period?"       na 12. OTHER SYMPTOMS: "Do you have any other symptoms?"  (e.g., fatigue, headache, muscle pain, weakness)       Hoarseness, severe back/ ribs and chest  pain due to coughing  Protocols used: CORONAVIRUS (COVID-19) PERSISTING SYMPTOMS FOLLOW-UP CALL-A-AH

## 2019-10-25 ENCOUNTER — Telehealth (INDEPENDENT_AMBULATORY_CARE_PROVIDER_SITE_OTHER): Payer: 59 | Admitting: Family Medicine

## 2019-10-25 ENCOUNTER — Telehealth: Payer: Self-pay | Admitting: Family Medicine

## 2019-10-25 ENCOUNTER — Other Ambulatory Visit: Payer: Self-pay

## 2019-10-25 ENCOUNTER — Encounter: Payer: Self-pay | Admitting: Family Medicine

## 2019-10-25 DIAGNOSIS — M62830 Muscle spasm of back: Secondary | ICD-10-CM | POA: Diagnosis not present

## 2019-10-25 DIAGNOSIS — U071 COVID-19: Secondary | ICD-10-CM | POA: Insufficient documentation

## 2019-10-25 DIAGNOSIS — J9801 Acute bronchospasm: Secondary | ICD-10-CM | POA: Diagnosis not present

## 2019-10-25 MED ORDER — GUAIFENESIN ER 600 MG PO TB12: 600.0000 mg | ORAL_TABLET | Freq: Two times a day (BID) | ORAL | 0 refills | Status: DC

## 2019-10-25 MED ORDER — BENZONATATE 200 MG PO CAPS: 200.0000 mg | ORAL_CAPSULE | Freq: Three times a day (TID) | ORAL | 0 refills | Status: DC | PRN

## 2019-10-25 MED ORDER — CYCLOBENZAPRINE HCL 10 MG PO TABS: 10.0000 mg | ORAL_TABLET | Freq: Three times a day (TID) | ORAL | 1 refills | Status: DC | PRN

## 2019-10-25 MED ORDER — GUAIFENESIN-CODEINE 100-10 MG/5ML PO SYRP: 5.0000 mL | ORAL_SOLUTION | Freq: Four times a day (QID) | ORAL | 0 refills | Status: DC | PRN

## 2019-10-25 MED ORDER — ALBUTEROL SULFATE HFA 108 (90 BASE) MCG/ACT IN AERS: 2.0000 | INHALATION_SPRAY | RESPIRATORY_TRACT | 2 refills | Status: DC | PRN

## 2019-10-25 MED ORDER — GUAIFENESIN ER 600 MG PO TB12: 600.0000 mg | ORAL_TABLET | Freq: Two times a day (BID) | ORAL | 0 refills | Status: AC

## 2019-10-25 NOTE — Assessment & Plan Note (Signed)
Muscle spasm of back reported, likely from persistent coughing.  Has had relief in the past of back spasm with cyclobenzaprine.  Will renew this prescription.  Plan: 1. Begin cyclobenzaprine 10mg  (can take 1/2 tablet = 5mg ) up to 3x per day as needed for back spasm 2. RTC if symptoms worsen or fail to improve

## 2019-10-25 NOTE — Telephone Encounter (Signed)
Copied from [place] [id number]. Topic: General - Other >> Oct 25, 2019  8:39 AM Celene Kras wrote: Reason for CRM: Pt called and is requesting to have a nurse give a call back. She is requesting to have something to help with her cough and muscle relaxer because she states that she pulled a muscle from coughing. Please advise.     [place] [address] [place] [facility] [place] [address] [address] Phone: [phone] Fax: [phone] Hours: Not open 24 hours

## 2019-10-25 NOTE — Telephone Encounter (Signed)
OV 10/25/2019

## 2019-10-25 NOTE — Progress Notes (Signed)
Virtual Visit via Telephone  The purpose of this virtual visit is to provide medical care while limiting exposure to the novel coronavirus (COVID19) for both patient and office staff.  Consent was obtained for phone visit:  Yes.   Answered questions that patient had about telehealth interaction:  Yes.   I discussed the limitations, risks, security and privacy concerns of performing an evaluation and management service by telephone. I also discussed with the patient that there may be a patient responsible charge related to this service. The patient expressed understanding and agreed to proceed.  Patient is at home and is accessed via telephone Services are provided by Harlin Rain, FNP-C from [facility])  ---------------------------------------------------------------------- Chief Complaint  Patient presents with  . Cough    Pt diagnose with COVID 10/06/2019 but still having persistent coughing, Laryngitis, back pain that she associates from coughing so much    S: Reviewed CMA documentation. I have called patient and gathered additional HPI as follows:  Shannon Small presents to clinic via telemedicine for virtual visit over the telephone.  Reports she had bronchitis in Mid-September, followed by having COVID diagnosed on 10/06/2019.  Had been treated with antibiotics, steroids and an inhaler, is having persistent coughing, feels that she may have pulled a muscle in her back from coughing.  Has some hoarseness with continued talking.  Has almost completed her amoxicillin antibiotic course and is requesting a refill on the antibiotic and cough medicine.  Denies any continued fever, chills, sweats, body ache, SOB, DOE, CP, sinus pressure/pain, headache, abdominal pain, n/v/d.  Patient is currently home Denies any high risk travel to areas of current concern for COVID19. Denies any known or suspected exposure to person with or possibly with COVID19.  Past Medical  History:  Diagnosis Date  . Acid reflux 09/09/2014  . Adjustment disorder with depressed mood 11/14/2006   ASSESSMENT: Bereavement. Recommend counseling with pastor or psychologist.   . Benign neoplasm of skin 11/14/2006  . Cephalalgia 02/10/2006  . Chronic infection of sinus 09/09/2014  . Extreme obesity 02/10/2006  . Hypertension   . LBP (low back pain) 01/15/2007   Chronic back strain and spasms due to obesity.   . Sleep related leg cramps 09/09/2014   Social History   Tobacco Use  . Smoking status: Never Smoker  . Smokeless tobacco: Never Used  Vaping Use  . Vaping Use: Never used  Substance Use Topics  . Alcohol use: Yes    Alcohol/week: 0.0 standard drinks    Comment: rarely  . Drug use: No    Current Outpatient Medications:  .  acetaminophen (TYLENOL) 500 MG tablet, Take 2 tablets (1,000 mg total) by mouth every 8 (eight) hours as needed for mild pain, moderate pain, fever or headache., Disp: 50 tablet, Rfl: 0 .  albuterol (VENTOLIN HFA) 108 (90 Base) MCG/ACT inhaler, Inhale 2 puffs into the lungs every 4 (four) hours as needed for wheezing or shortness of breath (cough)., Disp: 6.7 g, Rfl: 2 .  benzonatate (TESSALON) 200 MG capsule, Take 1 capsule (200 mg total) by mouth 3 (three) times daily as needed., Disp: 30 capsule, Rfl: 0 .  clotrimazole-betamethasone (LOTRISONE) cream, APPLY TOPICALLY TO GENITAL AREA FOR VAGINAL ITCHING AND IRRITATION FOR UP TO 2 WEEKS, THEN STOP, Disp: 45 g, Rfl: 3 .  cyclobenzaprine (FLEXERIL) 10 MG tablet, Take 1 tablet (10 mg total) by mouth 3 (three) times daily as needed., Disp: 90 tablet, Rfl: 1 .  furosemide (LASIX) 20 MG  tablet, TAKE 1 TO 2 TABLETS BY MOUTH ONCE DAILY AS NEEDED FOR EDEMA. USE FOR SHORT TERM ONLY UP TO 3 TO 5 DAYS THEN STOP AND MAY REPEAT., Disp: 90 tablet, Rfl: 0 .  hydrochlorothiazide (HYDRODIURIL) 25 MG tablet, Take 1 tablet by mouth once daily, Disp: 90 tablet, Rfl: 3 .  hydrocortisone 2.5 % cream, APPLY  CREAM TO AFFECTED AREA  TWICE DAILY AS NEEDED ECZEMA, Disp: 28 g, Rfl: 2 .  hydrOXYzine (ATARAX/VISTARIL) 25 MG tablet, Take 1 tablet (25 mg total) by mouth 3 (three) times daily as needed for itching., Disp: 60 tablet, Rfl: 2 .  ibuprofen (ADVIL) 800 MG tablet, TAKE 1 TABLET BY MOUTH EVERY 8 HOURS AS NEEDED, Disp: 90 tablet, Rfl: 3 .  ketoconazole (NIZORAL) 2 % cream, Apply topically daily as needed for irritation., Disp: 30 g, Rfl: 2 .  lisinopril (ZESTRIL) 20 MG tablet, Take 1 tablet by mouth once daily, Disp: 90 tablet, Rfl: 3 .  orphenadrine (NORFLEX) 100 MG tablet, TAKE 1 TABLET BY MOUTH TWICE DAILY AS NEEDED FOR MUSCLE SPASM, Disp: 60 tablet, Rfl: 0 .  docusate sodium (COLACE) 100 MG capsule, Take 1 capsule (100 mg total) by mouth 2 (two) times daily. (Patient not taking: Reported on 10/25/2019), Disp: 60 capsule, Rfl: 0 .  fluconazole (DIFLUCAN) 150 MG tablet, Take one tablet by mouth on Day 1. Repeat dose 2nd tablet on Day 3. (Patient not taking: Reported on 10/25/2019), Disp: 2 tablet, Rfl: 1 .  fluticasone (FLONASE) 50 MCG/ACT nasal spray, Place 2 sprays into both nostrils daily. (Patient not taking: Reported on 10/25/2019), Disp: 16 g, Rfl: 6 .  guaiFENesin (MUCINEX) 600 MG 12 hr tablet, Take 1 tablet (600 mg total) by mouth 2 (two) times daily for 10 days., Disp: 20 tablet, Rfl: 0 .  guaiFENesin-codeine (ROBITUSSIN AC) 100-10 MG/5ML syrup, Take 5-10 mLs by mouth 4 (four) times daily as needed for cough., Disp: 180 mL, Rfl: 0 .  naproxen (NAPROSYN) 500 MG tablet, Take 1 tablet (500 mg total) by mouth 2 (two) times daily with a meal. For 2-4 weeks then as needed (Patient not taking: Reported on 10/25/2019), Disp: 60 tablet, Rfl: 0  Depression screen [facility] 2/9 03/05/2019 01/28/2019 08/20/2018  Decreased Interest 0 0 0  Down, Depressed, Hopeless 0 0 0  PHQ - 2 Score 0 0 0  Altered sleeping 0 0 -  Tired, decreased energy 0 0 -  Change in appetite 0 0 -  Feeling bad or failure about yourself  0 0 -  Trouble  concentrating 0 0 -  Moving slowly or fidgety/restless 0 0 -  Suicidal thoughts 0 0 -  PHQ-9 Score 0 0 -  Difficult doing work/chores Not difficult at all Not difficult at all -    GAD 7 : Generalized Anxiety Score 03/05/2019  Nervous, Anxious, on Edge 0  Control/stop worrying 0  Worry too much - different things 0  Trouble relaxing 0  Restless 0  Easily annoyed or irritable 0  Afraid - awful might happen 0  Total GAD 7 Score 0  Anxiety Difficulty Not difficult at all    -------------------------------------------------------------------------- O: No physical exam performed due to remote telephone encounter.  Physical Exam: Patient remotely monitored without video.  Verbal communication appropriate.  Cognition normal.  No results found for this or any previous visit (from the past 2160 hour(s)).  -------------------------------------------------------------------------- A&P:  Problem List Items Addressed This Visit      Other   Cough due to bronchospasm  Likely persistent cough s/p COVID-19 infection.  Discussed anticipated length of time for COVID recovery with cough could be a few weeks, could have persistent cough for a few months, it is still early in the recovery process.  Encouraged to continue tessalon perles as needed, can refill her Robitussin AC to take as directed.  Will refill albuterol inhaler to use 1-2 puffs every 4-6 hours as needed for shortness of breath/cough or wheezing.  Discussed use of mucinex 600mg  every 12 hours to help break up residual mucus.  Encourage coughing and deep breathing.  Discussed antibiotic resistance concerns and that was not appropriate for a refill on amoxicillin or additional antibiotic at this time.  Patient verbalized understanding of treatment plan.      Relevant Medications   benzonatate (TESSALON) 200 MG capsule   albuterol (VENTOLIN HFA) 108 (90 Base) MCG/ACT inhaler   guaiFENesin-codeine (ROBITUSSIN AC) 100-10 MG/5ML syrup    guaiFENesin (MUCINEX) 600 MG 12 hr tablet   Muscle spasm of back    Muscle spasm of back reported, likely from persistent coughing.  Has had relief in the past of back spasm with cyclobenzaprine.  Will renew this prescription.  Plan: 1. Begin cyclobenzaprine 10mg  (can take 1/2 tablet = 5mg ) up to 3x per day as needed for back spasm 2. RTC if symptoms worsen or fail to improve      Relevant Medications   cyclobenzaprine (FLEXERIL) 10 MG tablet      Meds ordered this encounter  Medications  . benzonatate (TESSALON) 200 MG capsule    Sig: Take 1 capsule (200 mg total) by mouth 3 (three) times daily as needed.    Dispense:  30 capsule    Refill:  0  . albuterol (VENTOLIN HFA) 108 (90 Base) MCG/ACT inhaler    Sig: Inhale 2 puffs into the lungs every 4 (four) hours as needed for wheezing or shortness of breath (cough).    Dispense:  6.7 g    Refill:  2  . cyclobenzaprine (FLEXERIL) 10 MG tablet    Sig: Take 1 tablet (10 mg total) by mouth 3 (three) times daily as needed.    Dispense:  90 tablet    Refill:  1  . guaiFENesin-codeine (ROBITUSSIN AC) 100-10 MG/5ML syrup    Sig: Take 5-10 mLs by mouth 4 (four) times daily as needed for cough.    Dispense:  180 mL    Refill:  0  . guaiFENesin (MUCINEX) 600 MG 12 hr tablet    Sig: Take 1 tablet (600 mg total) by mouth 2 (two) times daily for 10 days.    Dispense:  20 tablet    Refill:  0    Follow-up: - Return if symptoms worsen or fail to improve with current treatment  Patient verbalizes understanding with the above medical recommendations including the limitation of remote medical advice.  Specific follow-up and call-back criteria were given for patient to follow-up or seek medical care more urgently if needed.  - Time spent in direct consultation with patient on phone: 6 minutes  Harlin Rain, [facility] 10/25/2019, 3:26 PM

## 2019-10-25 NOTE — Patient Instructions (Addendum)
I have sent refills of your prescriptions to your pharmacy on file.  Be sure not to drive or operate heavy machinery while taking the Robitussin AC or cyclobenzaprine, as these medications can cause sedation.  We will plan to see you back if your symptoms worsen or fail to improve  You will receive a survey after today's visit either digitally by e-mail or paper by USPS mail. Your experiences and feedback matter to [place].  Please respond so we know how we are doing as we provide care for you.  Call us with any questions/concerns/needs.  It is my goal to be available to you for your health concerns.  Thanks for choosing me to be a partner in your healthcare needs!  Harlin Rain, FNP-C Family Nurse Practitioner [facility] Phone: [phone]

## 2019-10-25 NOTE — Telephone Encounter (Signed)
Patient need appointment ---scheduled around 2:40 pm with Elmyra Ricks.

## 2019-10-25 NOTE — Assessment & Plan Note (Signed)
Likely persistent cough s/p COVID-19 infection.  Discussed anticipated length of time for COVID recovery with cough could be a few weeks, could have persistent cough for a few months, it is still early in the recovery process.  Encouraged to continue tessalon perles as needed, can refill her Robitussin AC to take as directed.  Will refill albuterol inhaler to use 1-2 puffs every 4-6 hours as needed for shortness of breath/cough or wheezing.  Discussed use of mucinex 600mg  every 12 hours to help break up residual mucus.  Encourage coughing and deep breathing.  Discussed antibiotic resistance concerns and that was not appropriate for a refill on amoxicillin or additional antibiotic at this time.  Patient verbalized understanding of treatment plan.

## 2019-10-28 ENCOUNTER — Telehealth: Payer: Self-pay | Admitting: Family Medicine

## 2019-10-28 ENCOUNTER — Other Ambulatory Visit: Payer: Self-pay | Admitting: Family Medicine

## 2019-10-28 DIAGNOSIS — B372 Candidiasis of skin and nail: Secondary | ICD-10-CM

## 2019-10-28 DIAGNOSIS — M62830 Muscle spasm of back: Secondary | ICD-10-CM

## 2019-10-28 DIAGNOSIS — U071 COVID-19: Secondary | ICD-10-CM

## 2019-10-28 DIAGNOSIS — S39012A Strain of muscle, fascia and tendon of lower back, initial encounter: Secondary | ICD-10-CM

## 2019-10-28 MED ORDER — PREDNISONE 50 MG PO TABS: ORAL_TABLET | ORAL | 0 refills | Status: DC

## 2019-10-28 NOTE — Telephone Encounter (Signed)
Patient called to ask the nurse or doctor to call in some medication for the sores in her mouth.  She stated that she thought the doctor was going to call it in with her other medications, but there was nothing at the pharmacy.  Please advise and call patient to discuss if needed at [phone]

## 2019-10-28 NOTE — Telephone Encounter (Signed)
Prednisone sent to pharmacy

## 2019-10-28 NOTE — Telephone Encounter (Signed)
Patient is notified.

## 2019-10-28 NOTE — Telephone Encounter (Signed)
Patient is requesting steroid.

## 2019-10-31 ENCOUNTER — Telehealth: Payer: Self-pay | Admitting: Family Medicine

## 2019-10-31 NOTE — Telephone Encounter (Signed)
Would be inappropriate for a refill on antibiotics without seeing patient or discussing symptoms.

## 2019-10-31 NOTE — Telephone Encounter (Signed)
The pt was notified that she need an appt. She was upset and stated she is not coming back in the office to get an antibiotic. I informed the patient that that was the providers recommendation. I also notified the patient that her abx is still working. She just finish taking her last antibiotic on yesterday afternoon. I informed her if her symptoms worsen or doesn't improve she can get back in contact with [place]. She verbalize understanding.

## 2019-10-31 NOTE — Telephone Encounter (Signed)
Patient called to request more medication, Amoxicillin.  She stated she is still coughing up green mucus and is about to run out of the medication.  Please advise and let patient know if this is possible.  CB# [phone]

## 2019-10-31 NOTE — Telephone Encounter (Signed)
Patient notified--her reply is that she does not have money for multiple appointment can you send for last time she is improving but still has some greenish mucus left --she denies appt ?

## 2019-10-31 NOTE — Telephone Encounter (Signed)
Let me know if I need to schedule this patient's appointment ?

## 2019-10-31 NOTE — Telephone Encounter (Signed)
Lets schedule for an appointment

## 2019-11-05 ENCOUNTER — Ambulatory Visit: Payer: Self-pay | Admitting: *Deleted

## 2019-11-05 ENCOUNTER — Emergency Department: Payer: 59

## 2019-11-05 ENCOUNTER — Emergency Department
Admission: EM | Admit: 2019-11-05 | Discharge: 2019-11-05 | Disposition: A | Discharge status: Home / Self Care | Payer: 59 | Attending: Emergency Medicine | Admitting: Emergency Medicine

## 2019-11-05 ENCOUNTER — Other Ambulatory Visit: Payer: Self-pay

## 2019-11-05 DIAGNOSIS — R0602 Shortness of breath: Secondary | ICD-10-CM | POA: Diagnosis not present

## 2019-11-05 DIAGNOSIS — Z8616 Personal history of COVID-19: Secondary | ICD-10-CM | POA: Insufficient documentation

## 2019-11-05 DIAGNOSIS — R000 Tachycardia, unspecified: Secondary | ICD-10-CM | POA: Diagnosis not present

## 2019-11-05 DIAGNOSIS — Z79899 Other long term (current) drug therapy: Secondary | ICD-10-CM | POA: Insufficient documentation

## 2019-11-05 DIAGNOSIS — R059 Cough, unspecified: Secondary | ICD-10-CM | POA: Diagnosis not present

## 2019-11-05 DIAGNOSIS — I10 Essential (primary) hypertension: Secondary | ICD-10-CM | POA: Diagnosis not present

## 2019-11-05 LAB — URINALYSIS, COMPLETE (UACMP) WITH MICROSCOPIC
Bacteria, UA: NONE SEEN
Bilirubin Urine: NEGATIVE
Glucose, UA: NEGATIVE mg/dL
Hgb urine dipstick: NEGATIVE
Ketones, ur: NEGATIVE mg/dL
Leukocytes,Ua: NEGATIVE
Nitrite: NEGATIVE
Protein, ur: NEGATIVE mg/dL
Specific Gravity, Urine: 1.005 (ref 1.005–1.030)
Squamous Epithelial / HPF: NONE SEEN (ref 0–5)
WBC, UA: NONE SEEN WBC/hpf (ref 0–5)
pH: 7 (ref 5.0–8.0)

## 2019-11-05 LAB — CBC WITH DIFFERENTIAL/PLATELET
Abs Immature Granulocytes: 0.09 10*3/uL — ABNORMAL HIGH (ref 0.00–0.07)
Basophils Absolute: 0 10*3/uL (ref 0.0–0.1)
Basophils Relative: 0 %
Eosinophils Absolute: 0 10*3/uL (ref 0.0–0.5)
Eosinophils Relative: 0 %
HCT: 40.8 % (ref 36.0–46.0)
Hemoglobin: 13 g/dL (ref 12.0–15.0)
Immature Granulocytes: 1 %
Lymphocytes Relative: 10 %
Lymphs Abs: 1.2 10*3/uL (ref 0.7–4.0)
MCH: 27.5 pg (ref 26.0–34.0)
MCHC: 31.9 g/dL (ref 30.0–36.0)
MCV: 86.3 fL (ref 80.0–100.0)
Monocytes Absolute: 0.1 10*3/uL (ref 0.1–1.0)
Monocytes Relative: 1 %
Neutro Abs: 10 10*3/uL — ABNORMAL HIGH (ref 1.7–7.7)
Neutrophils Relative %: 88 %
Platelets: 268 10*3/uL (ref 150–400)
RBC: 4.73 MIL/uL (ref 3.87–5.11)
RDW: 13.9 % (ref 11.5–15.5)
WBC: 11.4 10*3/uL — ABNORMAL HIGH (ref 4.0–10.5)
nRBC: 0 % (ref 0.0–0.2)

## 2019-11-05 LAB — COMPREHENSIVE METABOLIC PANEL
ALT: 25 U/L (ref 0–44)
AST: 28 U/L (ref 15–41)
Albumin: 3.8 g/dL (ref 3.5–5.0)
Alkaline Phosphatase: 74 U/L (ref 38–126)
Anion gap: 10 (ref 5–15)
BUN: 26 mg/dL — ABNORMAL HIGH (ref 6–20)
CO2: 21 mmol/L — ABNORMAL LOW (ref 22–32)
Calcium: 9.2 mg/dL (ref 8.9–10.3)
Chloride: 109 mmol/L (ref 98–111)
Creatinine, Ser: 0.68 mg/dL (ref 0.44–1.00)
GFR, Estimated: >60 mL/min (ref >60)
Glucose, Bld: 156 mg/dL — ABNORMAL HIGH (ref 70–99)
Potassium: 4.4 mmol/L (ref 3.5–5.1)
Sodium: 140 mmol/L (ref 135–145)
Total Bilirubin: 0.6 mg/dL (ref 0.3–1.2)
Total Protein: 7.8 g/dL (ref 6.5–8.1)

## 2019-11-05 LAB — BRAIN NATRIURETIC PEPTIDE: B Natriuretic Peptide: 214.2 pg/mL — ABNORMAL HIGH (ref 0.0–100.0)

## 2019-11-05 LAB — TROPONIN I (HIGH SENSITIVITY)
Troponin I (High Sensitivity): 51 ng/L — ABNORMAL HIGH (ref <18)
Troponin I (High Sensitivity): 65 ng/L — ABNORMAL HIGH (ref <18)

## 2019-11-05 LAB — FIBRIN DERIVATIVES D-DIMER (ARMC ONLY): Fibrin derivatives D-dimer (ARMC): 622.52 ng/mL (FEU) — ABNORMAL HIGH (ref 0.00–499.00)

## 2019-11-05 MED ORDER — HYDROCHLOROTHIAZIDE 25 MG PO TABS
25.0000 mg | ORAL_TABLET | Freq: Once | ORAL | Status: AC
  Administered 2019-11-05: 25 mg via ORAL
  Filled 2019-11-05: qty 1

## 2019-11-05 MED ORDER — IPRATROPIUM-ALBUTEROL 0.5-2.5 (3) MG/3ML IN SOLN: 3.0000 mL | Freq: Once | RESPIRATORY_TRACT | Status: DC

## 2019-11-05 MED ORDER — ACETAMINOPHEN 325 MG PO TABS
650.0000 mg | ORAL_TABLET | Freq: Once | ORAL | Status: AC
  Administered 2019-11-05: 650 mg via ORAL
  Filled 2019-11-05: qty 2

## 2019-11-05 MED ORDER — LISINOPRIL 10 MG PO TABS
20.0000 mg | ORAL_TABLET | Freq: Once | ORAL | Status: AC
  Administered 2019-11-05: 20 mg via ORAL
  Filled 2019-11-05: qty 2

## 2019-11-05 MED ORDER — FUROSEMIDE 10 MG/ML IJ SOLN
20.0000 mg | Freq: Once | INTRAMUSCULAR | Status: AC
  Administered 2019-11-05: 20 mg via INTRAVENOUS
  Filled 2019-11-05: qty 4

## 2019-11-05 MED ORDER — IOHEXOL 350 MG/ML SOLN
100.0000 mL | Freq: Once | INTRAVENOUS | Status: AC | PRN
  Administered 2019-11-05: 100 mL via INTRAVENOUS

## 2019-11-05 NOTE — Discharge Instructions (Addendum)
Your shortness of breath likely was a combination of several issues - you may have some remaining irritation/inflammation of the lungs after covid, as well as the increase in your heart rate after the inhaler, and some anxiety.  Your tests are also showing a small amount of fluid in the lungs (called edema) - this is usually from some strain on the heart and may be related to your high blood pressure.  We have given you some medication here to help drain this fluid off.   It is very important that you restart taking your blood pressure medications.  You should follow up with a cardiologist- we have given you information for one, or you can go through your primary care doctor.  Return to the ER immediately for new, worsening or continued shortness of breath, chest pain, cough, fever, weakness, swelling, or any other new or worsening symptoms that concern you.

## 2019-11-05 NOTE — ED Notes (Signed)
Pt resting comfortably at this time. No distress noted. Son at bedside 20G IV placed in L [facility] for CTA study. Purewick container emptied of 500 ml of straw colored urine.

## 2019-11-05 NOTE — ED Notes (Addendum)
D/C and f/up discussed with pt, pt verbalized understanding. No distress noted. VSS. No signature pad at bedside.

## 2019-11-05 NOTE — ED Notes (Signed)
Pt resting at this time. No distress noted at this time. IV medications infusing without difficulty. Call bell in reach.  VSS.

## 2019-11-05 NOTE — ED Triage Notes (Signed)
EMS called to patient's home for breathing difficulty starting at 0200. Pt with hx of HTN. Hypertensive with EMS at 190/110, tachycardic at 110-130, 97% on room air. Given supplemental O2 with EMS for comfort with improvement in respiratory rate from 30-22 breathes per minute. EtCO2 with EMS 35.

## 2019-11-05 NOTE — ED Provider Notes (Signed)
[facility] Emergency Department Provider Note   ____________________________________________   First MD Initiated Contact with Patient 11/05/19 [phone]     (approximate)  I have reviewed the triage vital signs and the nursing notes.   HISTORY  Chief Complaint Shortness of breath   HPI Shannon Small is a 59 y.o. female brought to the ED via EMS from home with a chief complaint of shortness of breath.  Patient with a history of hypertension who has been noncompliant with her medicines, morbid obesity, COVID-19 infection diagnosed 10/06/2019 who complains of cough and shortness of breath.  Patient reports she had bronchitis in mid September, followed by Covid diagnosis on 10/06/2019.  She has been treated with antibiotics, steroids and an inhaler.  Had a telemedicine visit on 10/25/2019 for persistent coughing, felt like she pulled a muscle in her back from coughing.  She was requesting another refill on her amoxicillin but was told that would not be necessary.  Currently taking Tessalon, Robitussin, Mucinex and albuterol inhaler.  Started on Flexeril and another course of prednisone.  EMS reports room air saturations 97% with extreme anxiety, hypertensive 190/110, tachycardic upwards of 130s, supplemental oxygen given for comfort with improvement in respiratory rate.  Patient denies fever, chest pain, abdominal pain, nausea, vomiting or diarrhea.  Urgently needs to urinate upon her arrival to the treatment room.     Past Medical History:  Diagnosis Date  . Acid reflux 09/09/2014  . Adjustment disorder with depressed mood 11/14/2006   ASSESSMENT: Bereavement. Recommend counseling with pastor or psychologist.   . Benign neoplasm of skin 11/14/2006  . Cephalalgia 02/10/2006  . Chronic infection of sinus 09/09/2014  . Extreme obesity 02/10/2006  . Hypertension   . LBP (low back pain) 01/15/2007   Chronic back strain and spasms due to obesity.   . Sleep related leg cramps  09/09/2014    Patient Active Problem List   Diagnosis Date Noted  . Cough due to bronchospasm 10/25/2019  . Muscle spasm of back 10/25/2019  . COVID-19 virus infection 10/25/2019  . Pure hypercholesterolemia 05/30/2019  . Lymphedema 09/03/2018  . Chronic venous insufficiency 09/03/2018  . Morbid obesity ([place]) 09/26/2017  . Osteoarthritis of knee 09/26/2017  . Right knee pain 09/21/2017  . Chronic cough 03/02/2016  . Allergic rhinitis due to allergen 03/02/2016  . Pre-diabetes 03/01/2016  . Urinary incontinence, mixed 03/01/2016  . Hypertension 09/09/2014  . Acid reflux 09/09/2014  . Sleep related leg cramps 09/09/2014  . Low back pain 01/15/2007  . Benign neoplasm of skin 11/14/2006  . Cephalalgia 02/10/2006  . Morbid obesity with BMI of 50.0-59.9, adult ([place]) 02/10/2006    Past Surgical History:  Procedure Laterality Date  . ABDOMINAL HYSTERECTOMY    . RHINOPLASTY  1982    Prior to Admission medications   Medication Sig Start Date End Date Taking? Authorizing Provider  acetaminophen (TYLENOL) 500 MG tablet Take 2 tablets (1,000 mg total) by mouth every 8 (eight) hours as needed for mild pain, moderate pain, fever or headache. 01/17/19 01/17/20  Rudene Re, MD  albuterol (VENTOLIN HFA) 108 (90 Base) MCG/ACT inhaler Inhale 2 puffs into the lungs every 4 (four) hours as needed for wheezing or shortness of breath (cough). 10/25/19   Malfi, Lupita Raider, FNP  benzonatate (TESSALON) 200 MG capsule Take 1 capsule (200 mg total) by mouth 3 (three) times daily as needed. 10/25/19   Malfi, Lupita Raider, FNP  clotrimazole-betamethasone (LOTRISONE) cream APPLY TOPICALLY TO GENITAL AREA FOR VAGINAL  ITCHING AND IRRITATION FOR UP TO 2 WEEKS, THEN STOP 04/10/19   Karamalegos, Devonne Doughty, DO  cyclobenzaprine (FLEXERIL) 10 MG tablet Take 1 tablet (10 mg total) by mouth 3 (three) times daily as needed. 10/25/19   Malfi, Lupita Raider, FNP  docusate sodium (COLACE) 100 MG capsule Take 1 capsule (100 mg  total) by mouth 2 (two) times daily. Patient not taking: Reported on 10/25/2019 01/17/19 01/17/20  Rudene Re, MD  fluconazole (DIFLUCAN) 150 MG tablet Take one tablet by mouth on Day 1. Repeat dose 2nd tablet on Day 3. Patient not taking: Reported on 10/25/2019 08/01/19   Olin Hauser, DO  fluticasone [facility]) 50 MCG/ACT nasal spray Place 2 sprays into both nostrils daily. Patient not taking: Reported on 10/25/2019 08/08/19   Olin Hauser, DO  furosemide (LASIX) 20 MG tablet TAKE 1 TO 2 TABLETS BY MOUTH ONCE DAILY AS NEEDED FOR EDEMA. USE FOR SHORT TERM ONLY UP TO 3 TO 5 DAYS THEN STOP AND MAY REPEAT. 01/09/19   Karamalegos, Devonne Doughty, DO  guaiFENesin-codeine (ROBITUSSIN AC) 100-10 MG/5ML syrup Take 5-10 mLs by mouth 4 (four) times daily as needed for cough. 10/25/19   Malfi, Lupita Raider, FNP  hydrochlorothiazide (HYDRODIURIL) 25 MG tablet Take 1 tablet by mouth once daily 04/10/19   Parks Ranger, Devonne Doughty, DO  hydrocortisone 2.5 % cream APPLY  CREAM TO AFFECTED AREA TWICE DAILY AS NEEDED ECZEMA 02/07/18   Parks Ranger, Devonne Doughty, DO  hydrOXYzine (ATARAX/VISTARIL) 25 MG tablet Take 1 tablet (25 mg total) by mouth 3 (three) times daily as needed for itching. 08/02/19   Parks Ranger, Devonne Doughty, DO  ibuprofen (ADVIL) 800 MG tablet TAKE 1 TABLET BY MOUTH EVERY 8 HOURS AS NEEDED 10/28/19   Parks Ranger, Devonne Doughty, DO  ketoconazole (NIZORAL) 2 % cream APPLY TOPICALLY DAILY AS NEEDED FOR  IRRITATION 10/28/19   Parks Ranger, Devonne Doughty, DO  lisinopril (ZESTRIL) 20 MG tablet Take 1 tablet by mouth once daily 04/10/19   Karamalegos, Devonne Doughty, DO  naproxen (NAPROSYN) 500 MG tablet Take 1 tablet (500 mg total) by mouth 2 (two) times daily with a meal. For 2-4 weeks then as needed Patient not taking: Reported on 10/25/2019 07/01/19   Olin Hauser, DO  orphenadrine (NORFLEX) 100 MG tablet TAKE 1 TABLET BY MOUTH TWICE DAILY AS NEEDED FOR MUSCLE SPASM 08/28/19   Parks Ranger,  Devonne Doughty, DO  predniSONE (DELTASONE) 50 MG tablet Take 1 tablet daily x 5 days 10/28/19   Verl Bangs, FNP    Allergies Patient has no known allergies.  Family History  Problem Relation Age of Onset  . Stomach cancer Father   . Kidney cancer Father   . Diabetes Maternal Grandmother   . Stroke Maternal Grandmother   . Stroke Maternal Grandfather   . Heart disease Paternal Grandmother   . Stroke Paternal Grandfather   . Diabetes Mother   . Alcohol abuse Brother   . Breast cancer Paternal Aunt   . Lung cancer Paternal Aunt   . Bladder Cancer Neg Hx   . Prostate cancer Neg Hx     Social History Social History   Tobacco Use  . Smoking status: Never Smoker  . Smokeless tobacco: Never Used  Vaping Use  . Vaping Use: Never used  Substance Use Topics  . Alcohol use: Yes    Alcohol/week: 0.0 standard drinks    Comment: rarely  . Drug use: No    Review of Systems  Constitutional: No fever/chills Eyes: No visual changes.  ENT: No sore throat. Cardiovascular: Denies chest pain. Respiratory: Positive for cough and shortness of breath. Gastrointestinal: No abdominal pain.  No nausea, no vomiting.  No diarrhea.  No constipation. Genitourinary: Negative for dysuria. Musculoskeletal: Negative for back pain. Skin: Negative for rash. Neurological: Negative for headaches, focal weakness or numbness.   ____________________________________________   PHYSICAL EXAM:  VITAL SIGNS: ED Triage Vitals  Enc Vitals Group     BP      Pulse      Resp      Temp      Temp src      SpO2      Weight      Height      Head Circumference      Peak Flow      Pain Score      Pain Loc      Pain Edu?      Excl. in [place]?     Constitutional: Alert and oriented.  Anxious appearing and in mild acute distress. Eyes: Conjunctivae are normal. PERRL. EOMI. Head: Atraumatic. Nose: No congestion/rhinnorhea. Mouth/Throat: Mucous membranes are moist.   Neck: No stridor.     Cardiovascular: Tachycardic rate, regular rhythm. Grossly normal heart sounds.  Good peripheral circulation. Respiratory: Normal respiratory effort.  No retractions. Lungs diminished bibasilarly. Gastrointestinal: Morbidly obese.  Soft and nontender. No distention. No abdominal bruits. No CVA tenderness. Musculoskeletal: No lower extremity tenderness.  2+ BLE nonpitting edema.  No joint effusions. Neurologic:  Normal speech and language. No gross focal neurologic deficits are appreciated.  Skin:  Skin is warm, dry and intact. No rash noted. Psychiatric: Mood and affect are normal. Speech and behavior are normal.  ____________________________________________   LABS (all labs ordered are listed, but only abnormal results are displayed)  Labs Reviewed  CBC WITH DIFFERENTIAL/PLATELET - Abnormal; Notable for the following components:      Result Value   WBC 11.4 (*)    Neutro Abs 10.0 (*)    Abs Immature Granulocytes 0.09 (*)    All other components within normal limits  COMPREHENSIVE METABOLIC PANEL - Abnormal; Notable for the following components:   CO2 21 (*)    Glucose, Bld 156 (*)    BUN 26 (*)    All other components within normal limits  FIBRIN DERIVATIVES D-DIMER ([facility] ONLY) - Abnormal; Notable for the following components:   Fibrin derivatives D-dimer ([facility]) 622.52 (*)    All other components within normal limits  TROPONIN I (HIGH SENSITIVITY) - Abnormal; Notable for the following components:   Troponin I (High Sensitivity) 51 (*)    All other components within normal limits  BRAIN NATRIURETIC PEPTIDE  URINALYSIS, COMPLETE (UACMP) WITH MICROSCOPIC   ____________________________________________  EKG  ED ECG REPORT I, Suhailah Kwan J, the attending physician, personally viewed and interpreted this ECG.   Date: 11/05/2019  EKG Time: 0555  Rate: 118  Rhythm: sinus tachycardia  Axis: Normal  Intervals:nonspecific intraventricular conduction delay  ST&T Change:  Nonspecific  ____________________________________________  RADIOLOGY I, Saga Balthazar J, personally viewed and evaluated these images (plain radiographs) as part of my medical decision making, as well as reviewing the written report by the radiologist.  ED MD interpretation: Mild pulmonary vascular congestion without overt edema  Official radiology report(s): DG Chest Port 1 View  Result Date: 11/05/2019 CLINICAL DATA:  Shortness of breath. EXAM: PORTABLE CHEST 1 VIEW COMPARISON:  01/15/2016 FINDINGS: 0557 hours. Low lung volumes. The cardio pericardial silhouette is enlarged. Interstitial markings are diffusely coarsened with chronic  features. There is pulmonary vascular congestion without overt pulmonary edema. The visualized bony structures of the thorax show no acute abnormality. Telemetry leads overlie the chest. IMPRESSION: Low volume film with enlargement of the cardiopericardial silhouette and pulmonary vascular congestion. Electronically Signed   By: Misty Stanley M.D.   On: 11/05/2019 06:14    ____________________________________________   PROCEDURES  Procedure(s) performed (including Critical Care):  .1-3 Lead EKG Interpretation Performed by: Paulette Blanch, MD Authorized by: Paulette Blanch, MD     Interpretation: abnormal     ECG rate:  118   ECG rate assessment: tachycardic     Rhythm: sinus tachycardia     Ectopy: none     Conduction: normal   Comments:     Patient placed on cardiac monitor to evaluate for arrhythmias     ____________________________________________   INITIAL IMPRESSION / ASSESSMENT AND PLAN / ED COURSE  As part of my medical decision making, I reviewed the following data within the [place] notes reviewed and incorporated, Labs reviewed, EKG interpreted, Old chart reviewed (telemedicine visits from 10/1 and 10/25/2019), Radiograph reviewed and Notes from prior ED visits     59 year old female presenting with cough and  shortness of breath; recent COVID-19 infection approximately 1 month ago status post antibiotics, steroids and inhalers. Differential includes, but is not limited to, viral syndrome, bronchitis including COPD exacerbation, pneumonia, reactive airway disease including asthma, CHF including exacerbation with or without pulmonary/interstitial edema, pneumothorax, ACS, thoracic trauma, and pulmonary embolism.  Will obtain cardiac work-up including BNP and D-dimer, chest x-ray.  Administer DuoNeb.  I have personally reviewed patient's records.  She tested positive at [facility] on 10/06/2019.  I am unable to see a record of this positive result in the computer system but notes that both of her telemedicine visits on 10/1 and 10/28/2019 also noted her positive Covid test.  Per hospital policy, would not retest less than 3 months.   Clinical Course as of Nov 04 708  Tue Nov 05, 2019  0604 Foley catheter per patient's request as she is leaking around the [facility] and has a continuous urge to urinate.  Will check UA.   [JS]  [id number] Patient feeling significantly better.  She looked through her phone in her purse but could not produce document of her positive Covid test on 9/26.  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  States the albuterol inhaler made her heart race and then she panicked and had difficulty breathing.  States she feels fine now and does not desire breathing treatment to due to the side effect she had at home from the albuterol inhaler. At this time patient is awaiting rest of her blood work and will require repeat troponin.  If unremarkable and patient still feeling better without hypoxia, then anticipate she may be able to be discharged home.  Care will be transferred to the oncoming provider Dr. Cherylann Banas.   [JS]    Clinical Course User Index [JS] Paulette Blanch, MD     ____________________________________________   FINAL CLINICAL IMPRESSION(S) / ED  DIAGNOSES  Final diagnoses:  Shortness of breath     ED Discharge Orders    None      *Please note:  Shannon Small was evaluated in Emergency Department on 11/05/2019 for the symptoms described in the history of present illness. She was evaluated in the context of the global COVID-19 pandemic, which necessitated consideration that the patient might  be at risk for infection with the SARS-CoV-2 virus that causes COVID-19. Institutional protocols and algorithms that pertain to the evaluation of patients at risk for COVID-19 are in a state of rapid change based on information released by regulatory bodies including the CDC and federal and state organizations. These policies and algorithms were followed during the patient's care in the ED.  Some ED evaluations and interventions may be delayed as a result of limited staffing during and the pandemic.*   Note:  This document was prepared using Dragon voice recognition software and may include unintentional dictation errors.   Paulette Blanch, MD 11/05/19 [phone]

## 2019-11-05 NOTE — ED Notes (Signed)
Pt at CT

## 2019-11-05 NOTE — ED Provider Notes (Signed)
-----------------------------------------   10:03 AM on 11/05/2019 -----------------------------------------  I took over care on this patient from Dr. Beather Arbour.  The patient was pending a repeat troponin and CT chest to rule out PE.  CT chest is negative for PE and shows mild bilateral lower lobe consolidations and mild edema.  Clinically there is no evidence of pneumonia given the patient's resolved symptoms and lack of fever or elevated WBC count.  Therefore the consolidations are consistent with atelectasis, versus recent Covid.  The mild edema on CT is concerning for possible mild new onset CHF.  This certainly could be contributing to the patient's symptoms especially since it seems that her shortness of breath occurs mainly when she is lying down.  Repeat troponin is slightly higher than the first, however this is not a clinically significant increase.  It is consistent with demand ischemia related to the patient's tachycardia and work of breathing as well as her persistently elevated blood pressure.  The patient reports that she has been noncompliant with her blood pressure medications for some time.  Repeat EKG shows no dynamic changes when compared to the EKG from earlier in this visit although the patient does have some new T wave inversions when compared to an EKG from earlier this year.  There are no significant ischemic findings.  On reassessment, the patient appears comfortable.  Her blood pressure is somewhat improved and the heart rate is in the 90s.  O2 saturation is also in the mid 90s on room air.  I discussed the results of the work-up with the patient.  Overall I suspect that she may be developing mild CHF, with her symptoms otherwise likely being related to the recent Covid.  Given these findings I offered the patient admission to the hospital, however she states that she feels much better and would like to go home.  She understands that based on the work-up today, there is the  possibility of strain on her heart which can potentially be life-threatening.  She agrees to restart her blood pressure medication and will follow up with her PMD.  I also have given her a cardiology referral.  I gave her very thorough return precautions and she expressed understanding.  She also understands she may return at anytime if she changes her mind and wishes to resume her work-up or be observed in the hospital.   ED ECG REPORT I, Arta Silence, the attending physician, personally viewed and interpreted this ECG.  Date: 11/05/2019 EKG Time: 0954 Rate: 92 Rhythm: normal sinus rhythm QRS Axis: normal Intervals: Nonspecific IVCD ST/T Wave abnormalities: Nonspecific T wave abnormalities Narrative Interpretation: no evidence of acute ischemia; no dynamic changes when compared to EKG of 0555 today     Arta Silence, MD 11/05/19 1007

## 2019-11-05 NOTE — Telephone Encounter (Signed)
C/o chest pain, SOB and anxiety since discharging from [facility]- ED. Patient reports she was seen in  ED today and she is very anxious to resume albuterol inhaler due to recent episode of SOB and "panic attack". Patient is post covid x 5 weeks of symptoms. Patient denies chest pain and SOB at this time but reports she becomes anxious regarding her breathing. Virtual appt made for 11/06/19 . Patient more relaxed at this time. Emotional support given. Patient wants to know if she should resume lisinopril and hydrodiuril tomorrow.Patient reports she will not take her albuterol inhaler due to recent ED visit and how medication made her feel.  Patient aware she should continue  her prescribed medications. Care advise given. Patient verbalized understanding of care advise and to call back or go to ED if symptoms worsen. Patient reports she does not want to go back to ED.   Reason for Disposition  Patient sounds very upset or troubled to the triager  Answer Assessment - Initial Assessment Questions 1. CONCERN: "What happened that made you call today?"     Still having chest pain and SOB and anxiety from being discharged from [facility]- ED 2. ANXIETY SYMPTOM SCREENING: "Can you describe how you have been feeling?"  (e.g., tense, restless, panicky, anxious, keyed up, trouble sleeping, trouble concentrating)     SOB, anxious, scared  3. ONSET: "How long have you been feeling this way?"     Since I got home 4. RECURRENT: "Have you felt this way before?"  If Yes, ask: "What happened that time?" "What helped these feelings go away in the past?"      Yes . Hx covid symptoms x 5 weeks  7. FUNCTIONAL IMPAIRMENT: "How have things been going for you overall? Have you had more difficulty than usual doing your normal daily activities?"  (e.g., better, same, worse; self-care, school, work, interactions)     Fighting covid symptoms x 5 weeks   10. STRESSORS: "Has there been any new stress or recent changes in your life?"        Just discharged from ED at [facility] for panic attack, SOB after taking mucinex and albuterol inhaler 13. OTHER SYMPTOMS: "Do you have any other physical symptoms right now?" (e.g., chest pain, palpitations, difficulty breathing, fever)       Chest pain, anxiety  14. PREGNANCY: "Is there any chance you are pregnant?" "When was your last menstrual period?"       na  Protocols used: ANXIETY AND PANIC ATTACK-A-AH

## 2019-11-06 ENCOUNTER — Encounter: Payer: Self-pay | Admitting: Family Medicine

## 2019-11-06 ENCOUNTER — Ambulatory Visit (INDEPENDENT_AMBULATORY_CARE_PROVIDER_SITE_OTHER): Payer: 59 | Admitting: Family Medicine

## 2019-11-06 DIAGNOSIS — J40 Bronchitis, not specified as acute or chronic: Secondary | ICD-10-CM

## 2019-11-06 DIAGNOSIS — Z8616 Personal history of COVID-19: Secondary | ICD-10-CM

## 2019-11-06 DIAGNOSIS — R0601 Orthopnea: Secondary | ICD-10-CM | POA: Diagnosis not present

## 2019-11-06 DIAGNOSIS — J9801 Acute bronchospasm: Secondary | ICD-10-CM

## 2019-11-06 DIAGNOSIS — R600 Localized edema: Secondary | ICD-10-CM | POA: Diagnosis not present

## 2019-11-06 MED ORDER — BENZONATATE 200 MG PO CAPS: 200.0000 mg | ORAL_CAPSULE | Freq: Three times a day (TID) | ORAL | 1 refills | Status: DC | PRN

## 2019-11-06 MED ORDER — AZITHROMYCIN 250 MG PO TABS: ORAL_TABLET | ORAL | 0 refills | Status: DC

## 2019-11-06 MED ORDER — FUROSEMIDE 20 MG PO TABS: 20.0000 mg | ORAL_TABLET | Freq: Every day | ORAL | 1 refills | Status: DC | PRN

## 2019-11-06 NOTE — Patient Instructions (Addendum)
[facility]) HeartCare at [place], [place] [address] Main: [phone]   Start fluid pill furosemide lasix 1-2 pills a day as needed for swelling fluid breathing  Refill cough perls  If you have any significant chest pain that does not go away within 30 minutes, is accompanied by nausea, sweating, shortness of breath, or made worse by activity, this may be evidence of a heart attack, especially if symptoms worsening instead of improving, please call 911 or go directly to the emergency room immediately for evaluation.    Please schedule a Follow-up Appointment to: Return if symptoms worsen or fail to improve.  If you have any other questions or concerns, please feel free to call the office or send a message through [place]. You may also schedule an earlier appointment if necessary.  Additionally, you may be receiving a survey about your experience at our office within a few days to 1 week by e-mail or mail. We value your feedback.  [name], DO [place]

## 2019-11-06 NOTE — Progress Notes (Signed)
Virtual Visit via Telephone The purpose of this virtual visit is to provide medical care while limiting exposure to the novel coronavirus (COVID19) for both patient and office staff.  Consent was obtained for phone visit:  Yes.   Answered questions that patient had about telehealth interaction:  Yes.   I discussed the limitations, risks, security and privacy concerns of performing an evaluation and management service by telephone. I also discussed with the patient that there may be a patient responsible charge related to this service. The patient expressed understanding and agreed to proceed.  Patient Location: Home Provider Location: [facility])  ---------------------------------------------------------------------- Chief Complaint  Patient presents with  . Hospitalization Follow-up    SOB    S: Reviewed CMA documentation. 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 Brief summary of recent course, patient had symptoms of recent COVID infection 10/06/19, she had recent bronchitis episode and persistent cough productive and then worsened with dyspnea, had episode other night woke up used inhaler and had severe worse problem and difficulty breathing EMS called, presented to ED on 10/26, testing in ED with elevated BNP, troponin, had elevated D-dimer, had CTA Chest was negative for PE and did show some pleural effusion , treated with diuretics and overall some improvement, was advised for referral to Cardiologist, had abnormality on EKG as well.  - Today reports overall has done fairly well after discharge from ED. Symptoms of cough have  improved but still productive now has some yellow thicker sputum at times. She has some persistent edema and dyspnea at times if lay down. But overall still improved.  I have reviewed the discharge medication list, and have reconciled the current and discharge medications today.  Denies any fevers, chills, sweats, body ache, sinus pain or pressure, headache, abdominal pain, diarrhea  Past Medical History:  Diagnosis Date  . Acid reflux 09/09/2014  . Adjustment disorder with depressed mood 11/14/2006   ASSESSMENT: Bereavement. Recommend counseling with pastor or psychologist.   . Benign neoplasm of skin 11/14/2006  . Cephalalgia 02/10/2006  . Chronic infection of sinus 09/09/2014  . Extreme obesity 02/10/2006  . Hypertension   . LBP (low back pain) 01/15/2007   Chronic back strain and spasms due to obesity.   . Sleep related leg cramps 09/09/2014   Social History   Tobacco Use  . Smoking status: Never Smoker  . Smokeless tobacco: Never Used  Vaping Use  . Vaping Use: Never used  Substance Use Topics  . Alcohol use: Yes    Alcohol/week: 0.0 standard drinks    Comment: rarely  . Drug use: No    Current Outpatient Medications:  .  acetaminophen (TYLENOL) 500 MG tablet, Take 2 tablets (1,000 mg total) by mouth every 8 (eight) hours as needed for mild pain, moderate pain, fever or headache., Disp: 50 tablet, Rfl: 0 .  benzonatate (TESSALON) 200 MG capsule, Take 1 capsule (200 mg total) by mouth 3 (three) times daily as needed., Disp: 30 capsule, Rfl: 1 .  clotrimazole-betamethasone (LOTRISONE) cream, APPLY TOPICALLY TO GENITAL AREA FOR VAGINAL ITCHING AND IRRITATION FOR UP TO 2 WEEKS, THEN STOP, Disp: 45 g, Rfl: 3 .  cyclobenzaprine (  FLEXERIL) 10 MG tablet, Take 1 tablet (10 mg total) by mouth 3 (three) times daily as needed., Disp: 90 tablet, Rfl: 1 .  docusate sodium (COLACE) 100 MG capsule, Take 1 capsule (100 mg total) by mouth 2 (two) times daily., Disp: 60 capsule, Rfl: 0 .  fluconazole  (DIFLUCAN) 150 MG tablet, Take one tablet by mouth on Day 1. Repeat dose 2nd tablet on Day 3., Disp: 2 tablet, Rfl: 1 .  fluticasone (FLONASE) 50 MCG/ACT nasal spray, Place 2 sprays into both nostrils daily., Disp: 16 g, Rfl: 6 .  furosemide (LASIX) 20 MG tablet, Take 1-2 tablets (20-40 mg total) by mouth daily as needed for fluid or edema., Disp: 30 tablet, Rfl: 1 .  guaiFENesin-codeine (ROBITUSSIN AC) 100-10 MG/5ML syrup, Take 5-10 mLs by mouth 4 (four) times daily as needed for cough., Disp: 180 mL, Rfl: 0 .  hydrochlorothiazide (HYDRODIURIL) 25 MG tablet, Take 1 tablet by mouth once daily, Disp: 90 tablet, Rfl: 3 .  hydrocortisone 2.5 % cream, APPLY  CREAM TO AFFECTED AREA TWICE DAILY AS NEEDED ECZEMA, Disp: 28 g, Rfl: 2 .  hydrOXYzine (ATARAX/VISTARIL) 25 MG tablet, Take 1 tablet (25 mg total) by mouth 3 (three) times daily as needed for itching., Disp: 60 tablet, Rfl: 2 .  ibuprofen (ADVIL) 800 MG tablet, TAKE 1 TABLET BY MOUTH EVERY 8 HOURS AS NEEDED, Disp: 90 tablet, Rfl: 0 .  ketoconazole (NIZORAL) 2 % cream, APPLY TOPICALLY DAILY AS NEEDED FOR  IRRITATION, Disp: 30 g, Rfl: 0 .  lisinopril (ZESTRIL) 20 MG tablet, Take 1 tablet by mouth once daily, Disp: 90 tablet, Rfl: 3 .  naproxen (NAPROSYN) 500 MG tablet, Take 1 tablet (500 mg total) by mouth 2 (two) times daily with a meal. For 2-4 weeks then as needed, Disp: 60 tablet, Rfl: 0 .  orphenadrine (NORFLEX) 100 MG tablet, TAKE 1 TABLET BY MOUTH TWICE DAILY AS NEEDED FOR MUSCLE SPASM, Disp: 60 tablet, Rfl: 0 .  azithromycin (ZITHROMAX Z-PAK) 250 MG tablet, Take 2 tabs (500mg  total) on Day 1. Take 1 tab (250mg ) daily for next 4 days., Disp: 6 tablet, Rfl: 0  Depression screen [facility] 2/9 03/05/2019 01/28/2019 08/20/2018  Decreased Interest 0 0 0  Down, Depressed, Hopeless 0 0 0  PHQ - 2 Score 0 0 0  Altered sleeping 0 0 -  Tired, decreased energy 0 0 -  Change in appetite 0 0 -  Feeling bad or failure about yourself  0 0 -  Trouble concentrating  0 0 -  Moving slowly or fidgety/restless 0 0 -  Suicidal thoughts 0 0 -  PHQ-9 Score 0 0 -  Difficult doing work/chores Not difficult at all Not difficult at all -    GAD 7 : Generalized Anxiety Score 03/05/2019  Nervous, Anxious, on Edge 0  Control/stop worrying 0  Worry too much - different things 0  Trouble relaxing 0  Restless 0  Easily annoyed or irritable 0  Afraid - awful might happen 0  Total GAD 7 Score 0  Anxiety Difficulty Not difficult at all    -------------------------------------------------------------------------- O: No physical exam performed due to remote telephone encounter.  Lab results reviewed.  ED ECG REPORT - copied from ED record and reviewed.  Date: 11/05/2019 EKG Time: 0954 Rate: 92 Rhythm: normal sinus rhythm QRS Axis: normal Intervals: Nonspecific IVCD ST/T Wave abnormalities: Nonspecific T wave abnormalities Narrative Interpretation: no evidence of acute ischemia; no dynamic changes when compared to EKG of 0555 today  Recent Results (from the past 2160 hour(s))  Brain natriuretic peptide     Status: Abnormal   Collection Time: 11/05/19  5:58 AM  Result Value Ref Range   B Natriuretic Peptide 214.2 (H) 0.0 - 100.0 pg/mL    Comment: Performed at [facility], [address] [place], [place] [address]  CBC with Differential     Status: Abnormal   Collection Time: 11/05/19  5:59 AM  Result Value Ref Range   WBC 11.4 (H) 4.0 - 10.5 K/uL   RBC 4.73 3.87 - 5.11 MIL/uL   Hemoglobin 13.0 12.0 - 15.0 g/dL   HCT 40.8 36 - 46 %   MCV 86.3 80.0 - 100.0 fL   MCH 27.5 26.0 - 34.0 pg   MCHC 31.9 30.0 - 36.0 g/dL   RDW 13.9 11.5 - 15.5 %   Platelets 268 150 - 400 K/uL   nRBC 0.0 0.0 - 0.2 %   Neutrophils Relative % 88 %   Neutro Abs 10.0 (H) 1.7 - 7.7 K/uL   Lymphocytes Relative 10 %   Lymphs Abs 1.2 0.7 - 4.0 K/uL   Monocytes Relative 1 %   Monocytes Absolute 0.1 0.1 - 1.0 K/uL   Eosinophils Relative 0 %   Eosinophils Absolute  0.0 0.0 - 0.5 K/uL   Basophils Relative 0 %   Basophils Absolute 0.0 0.0 - 0.1 K/uL   Immature Granulocytes 1 %   Abs Immature Granulocytes 0.09 (H) 0.00 - 0.07 K/uL    Comment: Performed at [facility], [address] [place], [place] [address]  Comprehensive metabolic panel     Status: Abnormal   Collection Time: 11/05/19  5:59 AM  Result Value Ref Range   Sodium 140 135 - 145 mmol/L   Potassium 4.4 3.5 - 5.1 mmol/L   Chloride 109 98 - 111 mmol/L   CO2 21 (L) 22 - 32 mmol/L   Glucose, Bld 156 (H) 70 - 99 mg/dL    Comment: Glucose reference range applies only to samples taken after fasting for at least 8 hours.   BUN 26 (H) 6 - 20 mg/dL   Creatinine, Ser 0.68 0.44 - 1.00 mg/dL   Calcium 9.2 8.9 - 10.3 mg/dL   Total Protein 7.8 6.5 - 8.1 g/dL   Albumin 3.8 3.5 - 5.0 g/dL   AST 28 15 - 41 U/L   ALT 25 0 - 44 U/L   Alkaline Phosphatase 74 38 - 126 U/L   Total Bilirubin 0.6 0.3 - 1.2 mg/dL   GFR, Estimated >60 >60 mL/min    Comment: (NOTE) Calculated using the CKD-EPI Creatinine Equation (2021)    Anion gap 10 5 - 15    Comment: Performed at [facility], [place], [place] [address]  Troponin I (High Sensitivity)     Status: Abnormal   Collection Time: 11/05/19  5:59 AM  Result Value Ref Range   Troponin I (High Sensitivity) 51 (H) <18 ng/L    Comment: (NOTE) Elevated high sensitivity troponin I (hsTnI) values and significant  changes across serial measurements may suggest ACS but many other  chronic and acute conditions are known to elevate hsTnI results.  Refer to the "Links" section for chest pain algorithms and additional  guidance. Performed at [facility], [address], [place], [place] [address]   Fibrin derivatives D-Dimer     Status: Abnormal   Collection Time: 11/05/19  5:59 AM  Result Value Ref Range  Fibrin derivatives D-dimer ([facility]) 622.52 (H) 0.00 - 499.00 ng/mL (FEU)    Comment: (NOTE) <> Exclusion of  Venous Thromboembolism (VTE) - OUTPATIENT ONLY   (Emergency Department or [place])    0-499 ng/ml (FEU): With a low to intermediate pretest probability                      for VTE this test result excludes the diagnosis                      of VTE.   >499 ng/ml (FEU) : VTE not excluded; additional work up for VTE is                      required.  <> Testing on Inpatients and Evaluation of Disseminated Intravascular   Coagulation (DIC) Reference Range:   0-499 ng/ml (FEU) Performed at [facility], [address] [place], [place] [address]   Urinalysis, Complete w Microscopic Urine, Clean Catch     Status: Abnormal   Collection Time: 11/05/19  7:28 AM  Result Value Ref Range   Color, Urine COLORLESS (A) YELLOW   APPearance CLEAR (A) CLEAR   Specific Gravity, Urine 1.005 1.005 - 1.030   pH 7.0 5.0 - 8.0   Glucose, UA NEGATIVE NEGATIVE mg/dL   Hgb urine dipstick NEGATIVE NEGATIVE   Bilirubin Urine NEGATIVE NEGATIVE   Ketones, ur NEGATIVE NEGATIVE mg/dL   Protein, ur NEGATIVE NEGATIVE mg/dL   Nitrite NEGATIVE NEGATIVE   Leukocytes,Ua NEGATIVE NEGATIVE   RBC / HPF 0-5 0 - 5 RBC/hpf   WBC, UA NONE SEEN 0 - 5 WBC/hpf   Bacteria, UA NONE SEEN NONE SEEN   Squamous Epithelial / LPF NONE SEEN 0 - 5    Comment: Performed at [facility], [place], [place] [address]  Troponin I (High Sensitivity)     Status: Abnormal   Collection Time: 11/05/19  8:06 AM  Result Value Ref Range   Troponin I (High Sensitivity) 65 (H) <18 ng/L    Comment: (NOTE) Elevated high sensitivity troponin I (hsTnI) values and significant  changes across serial measurements may suggest ACS but many other  chronic and acute conditions are known to elevate hsTnI results.  Refer to the "Links" section for chest pain algorithms and additional  guidance. Performed at [facility], [address] [place], [place] [address]    I have personally reviewed the radiology  report from 11/05/19 on CTA.   CT Angio Chest PE W/Cm &/Or Wo CmPerformed 11/05/2019 Final result  Study Result CLINICAL DATA: Shortness of breath  EXAM: CT ANGIOGRAPHY CHEST WITH CONTRAST  TECHNIQUE: Multidetector CT imaging of the chest was performed using the standard protocol during bolus administration of intravenous contrast. Multiplanar CT image reconstructions and MIPs were obtained to evaluate the vascular anatomy.  CONTRAST: 15mL OMNIPAQUE IOHEXOL 350 MG/ML SOLN  COMPARISON: Chest x-ray 11/05/2019  FINDINGS: Cardiovascular: Satisfactory opacification of the pulmonary arteries to the lobar branch level. Respiratory motion artifact degrades evaluation of the segmental and subsegmental branch arteries, particularly within the lung bases. Within this limitation, there is no evidence of pulmonary embolism. Thoracic aorta nonaneurysmal. Mild cardiomegaly. No pericardial effusion.  Mediastinum/Nodes: No enlarged mediastinal, hilar, or axillary lymph nodes. Thyroid gland, trachea, and esophagus demonstrate no significant findings.  Lungs/Pleura: Trace bilateral pleural effusions. Mild dependent bibasilar consolidations. There is mild interlobular septal thickening with subtle ground-glass attenuation particularly within the perihilar  and bibasilar regions, suggesting component of edema. No pneumothorax.  Upper Abdomen: No acute abnormality.  Musculoskeletal: No chest wall abnormality. No acute or significant osseous findings.  Review of the MIP images confirms the above findings.  IMPRESSION: 1. No evidence of pulmonary embolism to the lobar branch level. 2. Trace bilateral pleural effusions with mild dependent bibasilar consolidations, which may represent atelectasis or pneumonia. 3. Cardiomegaly with mild interstitial edema.   Electronically Signed By: Davina Poke D.O. On: 11/05/2019  08:15   -------------------------------------------------------------------------- A&P:  Problem List Items Addressed This Visit    Cough due to bronchospasm - Primary   Relevant Medications   benzonatate (TESSALON) 200 MG capsule    Other Visit Diagnoses    Bronchitis       Relevant Medications   azithromycin (ZITHROMAX Z-PAK) 250 MG tablet   Bilateral lower extremity edema       Relevant Medications   furosemide (LASIX) 20 MG tablet   Other Relevant Orders   Ambulatory referral to Cardiology   Orthopnea       Relevant Orders   Ambulatory referral to Cardiology   History of COVID-19         Clinically with constellation of symptoms primarily dyspnea/orthopnea, edema, and now productive cough History of COVID19 within past month ED visit reviewed from yesterday 10/26 Extensive testing, see CTA above and lab results - concerning with elevated BNP, Troponin, pleural effusion, and abnormal EKG at risk of new CHF post covid  Re order Furosemide 20mg  take 1-2 pill daily PRN edema, dyspnea Trial on Azithromycin Z-pak given productive sputum and lingering symptoms, reviewed CTA showed questionable pneumonia but seems that clinical correlation at time of ED visit was more for post covid residual changes.  Re order benzonatate PRN cough  Referral to Cardiology for further work up evaluation.  Return criteria reviewed if significant change or worsening.  Meds ordered this encounter  Medications  . furosemide (LASIX) 20 MG tablet    Sig: Take 1-2 tablets (20-40 mg total) by mouth daily as needed for fluid or edema.    Dispense:  30 tablet    Refill:  1  . azithromycin (ZITHROMAX Z-PAK) 250 MG tablet    Sig: Take 2 tabs (500mg  total) on Day 1. Take 1 tab (250mg ) daily for next 4 days.    Dispense:  6 tablet    Refill:  0  . benzonatate (TESSALON) 200 MG capsule    Sig: Take 1 capsule (200 mg total) by mouth 3 (three) times daily as needed.    Dispense:  30 capsule    Refill:   1    Follow-up: - Return as needed if not improved  Patient verbalizes understanding with the above medical recommendations including the limitation of remote medical advice.  Specific follow-up and call-back criteria were given for patient to follow-up or seek medical care more urgently if needed.   - Time spent in direct consultation with patient on phone: 15 minutes   Shannon Small, [facility] 11/06/2019, 4:13 PM

## 2019-11-07 ENCOUNTER — Ambulatory Visit: Payer: Self-pay

## 2019-11-07 NOTE — Telephone Encounter (Signed)
Pt. Reports she had a virtual visit yesterday. States "I'm supposed to have a cardiology referral, and I would like it as soon as possible." States her shortness of breath is worse with walking. States "Please tell my doctor I need to be seen as soon as possible." Please advise pt.  Answer Assessment - Initial Assessment Questions 1. RESPIRATORY STATUS: "Describe your breathing?" (e.g., wheezing, shortness of breath, unable to speak, severe coughing)      Shortness of breath 2. ONSET: "When did this breathing problem begin?"      Worse today 3. PATTERN "Does the difficult breathing come and go, or has it been constant since it started?"      Comes and goes 4. SEVERITY: "How bad is your breathing?" (e.g., mild, moderate, severe)    - MILD: No SOB at rest, mild SOB with walking, speaks normally in sentences, can lay down, no retractions, pulse < 100.    - MODERATE: SOB at rest, SOB with minimal exertion and prefers to sit, cannot lie down flat, speaks in phrases, mild retractions, audible wheezing, pulse 100-120.    - SEVERE: Very SOB at rest, speaks in single words, struggling to breathe, sitting hunched forward, retractions, pulse > 120      Mild 5. RECURRENT SYMPTOM: "Have you had difficulty breathing before?" If Yes, ask: "When was the last time?" and "What happened that time?"      Yes 6. CARDIAC HISTORY: "Do you have any history of heart disease?" (e.g., heart attack, angina, bypass surgery, angioplasty)      Yes 7. LUNG HISTORY: "Do you have any history of lung disease?"  (e.g., pulmonary embolus, asthma, emphysema)     No 8. CAUSE: "What do you think is causing the breathing problem?"      Unsure 9. OTHER SYMPTOMS: "Do you have any other symptoms? (e.g., dizziness, runny nose, cough, chest pain, fever)     No 10. PREGNANCY: "Is there any chance you are pregnant?" "When was your last menstrual period?"       No 11. TRAVEL: "Have you traveled out of the country in the last month?"  (e.g., travel history, exposures)       No  Protocols used: BREATHING DIFFICULTY-A-AH

## 2019-11-07 NOTE — Telephone Encounter (Signed)
FYI---Advised patient that referral was already placed by Dr Raliegh Ip yesterday and she should be getting phone call soon from cardiologist office also Lexine Baton is aware.

## 2019-11-22 ENCOUNTER — Encounter: Payer: Self-pay | Admitting: Cardiology

## 2019-11-22 ENCOUNTER — Other Ambulatory Visit: Payer: Self-pay

## 2019-11-22 ENCOUNTER — Ambulatory Visit (INDEPENDENT_AMBULATORY_CARE_PROVIDER_SITE_OTHER): Payer: 59 | Admitting: Cardiology

## 2019-11-22 VITALS — BP 140/80 | HR 98 | Ht 65.0 in | Wt 321.2 lb

## 2019-11-22 DIAGNOSIS — R0600 Dyspnea, unspecified: Secondary | ICD-10-CM | POA: Diagnosis not present

## 2019-11-22 DIAGNOSIS — I517 Cardiomegaly: Secondary | ICD-10-CM

## 2019-11-22 DIAGNOSIS — I10 Essential (primary) hypertension: Secondary | ICD-10-CM

## 2019-11-22 NOTE — Patient Instructions (Signed)
Medication Instructions:  Your physician recommends that you continue on your current medications as directed. Please refer to the Current Medication list given to you today.  *If you need a refill on your cardiac medications before your next appointment, please call your pharmacy*   Lab Work: None ordered.  If you have labs (blood work) drawn today and your tests are completely normal, you will receive your results only by: Marland Kitchen MyChart Message (if you have MyChart) OR . A paper copy in the mail If you have any lab test that is abnormal or we need to change your treatment, we will call you to review the results.   Testing/Procedures:  1) Your physician has requested that you have an echocardiogram. Echocardiography is a painless test that uses sound waves to create images of your heart. It provides your doctor with information about the size and shape of your heart and how well your heart's chambers and valves are working. This procedure takes approximately one hour. There are no restrictions for this procedure.   2) [place]       Your caregiver has ordered a Stress Test with nuclear imaging. The purpose of this test is to evaluate the blood supply to your heart muscle. This procedure is referred to as a "Non-Invasive Stress Test." This is because other than having an IV started in your vein, nothing is inserted or "invades" your body. Cardiac stress tests are done to find areas of poor blood flow to the heart by determining the extent of coronary artery disease (CAD). Some patients exercise on a treadmill, which naturally increases the blood flow to your heart, while others who are  unable to walk on a treadmill due to physical limitations have a pharmacologic/chemical stress agent called Lexiscan . This medicine will mimic walking on a treadmill by temporarily increasing your coronary blood flow.      PLEASE REPORT TO [facility] MEDICAL MALL ENTRANCE   THE VOLUNTEERS AT THE FIRST DESK WILL  DIRECT YOU WHERE TO GO     *Please note: these test may take anywhere between 2-4 hours to complete       Date of Procedure:_____________________________________   Arrival Time for Procedure:______________________________    PLEASE NOTIFY THE OFFICE AT LEAST 24 HOURS IN ADVANCE IF YOU ARE UNABLE TO KEEP YOUR APPOINTMENT.  [place] 24 HOURS IN ADVANCE IF YOU ARE UNABLE TO KEEP YOUR APPOINTMENT. [phone]         How to prepare for your Myoview test:         ____:  Hold diabetes medication the morning of procedure:   ____:  Hold betablocker(s) night before procedure and morning of procedure:     _XX__:  Hold other medications as follows:  furosemide (LASIX) 20 MG tablet & hydrochlorothiazide (HYDRODIURIL) 25 MG tablet    1. Do not eat or drink after midnight  2. No caffeine for 24 hours prior to test  3. No smoking 24 hours prior to test.  4. Unless instructed otherwise, Take your medication with a small sips of water 5.         Ladies, please do not wear dresses. Skirts or pants are appropriate. Please wear a short sleeve shirt.  6. No perfume, cologne or lotion.  7. Wear comfortable walking shoes. No heels!      Follow-Up: At [facility], you and your health needs are our priority.  As part of our continuing mission to  provide you with exceptional heart care, we have created designated Provider Care Teams.  These Care Teams include your primary Cardiologist (physician) and Advanced Practice Providers (APPs -  Physician Assistants and Nurse Practitioners) who all work together to provide you with the care you need, when you need it.  We recommend signing up for the patient portal called "MyChart".  Sign up information is provided on this After Visit Summary.  MyChart is used to connect with patients for Virtual Visits (Telemedicine).  Patients are able to view lab/test results, encounter notes, upcoming  appointments, etc.  Non-urgent messages can be sent to your provider as well.   To learn more about what you can do with MyChart, go to [email or web address].    Your next appointment:    Follow up AFTER Echo and Myoview  The format for your next appointment:   In Person  Provider:   Kate Sable, MD

## 2019-11-22 NOTE — Progress Notes (Signed)
Cardiology Office Note:    Date:  11/22/2019   ID:  Shannon Small, DOB 1960/12/27, MRN [id number]  PCP:  Olin Hauser, DO  [place] Cardiologist:  No primary care provider on file.  [facility] Electrophysiologist:  None   Referring MD: [name] *   Chief Complaint  Patient presents with  . New Patient (Initial Visit)    Ref by Dr. Parks Ranger for edema. Meds reviewed by the pt. verbally. Pt. c/o chest pain, shortness of breath, LE edema, and fluttering in chest.    Shannon Small is a 59 y.o. female who is being seen today for the evaluation of edema at the request of [name] *.   History of Present Illness:    Shannon Small is a 59 y.o. female with a hx of hypertension, obesity, GERD, chronic back pain who presents due to lower extremity edema, shortness of breath.  Patient states having dyspnea on exertion since acquiring Covid roughly 2 months ago.  Also developed some bronchitis and was given antibiotics by primary care provider.  She states being under a lot of stress with brother currently in the ED with possible cancer, daughter with bipolar depression who depends on her, her mother also depends on her for transportation and bills.  Has occasional chest discomfort not related with exertion.  Has knee pain, needs to lose weight in order to have surgery performed.  Lost about 70 pounds on keto diet, but gained it back due to family related stress.  Patient is intact to the ED 10/26 a month and a half ago due to worsening shortness of breath.  Attributed to possible bronchitis.  Chest CTA was negative for PE, trace pleural effusion noted.  BNP elevated at 214.  Managed with diuretics with some improvement and subsequently discharged.  Has not taken diuretics the past couple of days because this will prevent her from her daily routine due to frequent bathroom stops.    Past Medical History:  Diagnosis Date  . Acid reflux 09/09/2014  .  Adjustment disorder with depressed mood 11/14/2006   ASSESSMENT: Bereavement. Recommend counseling with pastor or psychologist.   . Benign neoplasm of skin 11/14/2006  . Cephalalgia 02/10/2006  . Chronic infection of sinus 09/09/2014  . Extreme obesity 02/10/2006  . Hypertension   . LBP (low back pain) 01/15/2007   Chronic back strain and spasms due to obesity.   . Sleep related leg cramps 09/09/2014    Past Surgical History:  Procedure Laterality Date  . ABDOMINAL HYSTERECTOMY    . RHINOPLASTY  1982    Current Medications: Current Meds  Medication Sig  . acetaminophen (TYLENOL) 500 MG tablet Take 2 tablets (1,000 mg total) by mouth every 8 (eight) hours as needed for mild pain, moderate pain, fever or headache.  . clotrimazole-betamethasone (LOTRISONE) cream APPLY TOPICALLY TO GENITAL AREA FOR VAGINAL ITCHING AND IRRITATION FOR UP TO 2 WEEKS, THEN STOP  . cyclobenzaprine (FLEXERIL) 10 MG tablet Take 1 tablet (10 mg total) by mouth 3 (three) times daily as needed.  . docusate sodium (COLACE) 100 MG capsule Take 1 capsule (100 mg total) by mouth 2 (two) times daily.  . furosemide (LASIX) 20 MG tablet Take 1-2 tablets (20-40 mg total) by mouth daily as needed for fluid or edema.  . hydrochlorothiazide (HYDRODIURIL) 25 MG tablet Take 1 tablet by mouth once daily  . hydrocortisone 2.5 % cream APPLY  CREAM TO AFFECTED AREA TWICE DAILY AS NEEDED ECZEMA  .  hydrOXYzine (ATARAX/VISTARIL) 25 MG tablet Take 1 tablet (25 mg total) by mouth 3 (three) times daily as needed for itching.  Marland Kitchen ibuprofen (ADVIL) 800 MG tablet TAKE 1 TABLET BY MOUTH EVERY 8 HOURS AS NEEDED  . ketoconazole (NIZORAL) 2 % cream APPLY TOPICALLY DAILY AS NEEDED FOR  IRRITATION  . lisinopril (ZESTRIL) 20 MG tablet Take 1 tablet by mouth once daily  . orphenadrine (NORFLEX) 100 MG tablet TAKE 1 TABLET BY MOUTH TWICE DAILY AS NEEDED FOR MUSCLE SPASM     Allergies:   Patient has no known allergies.   Social History   Socioeconomic  History  . Marital status: Married    Spouse name: Not on file  . Number of children: Not on file  . Years of education: Not on file  . Highest education level: Not on file  Occupational History  . Not on file  Tobacco Use  . Smoking status: Never Smoker  . Smokeless tobacco: Never Used  Vaping Use  . Vaping Use: Never used  Substance and Sexual Activity  . Alcohol use: Yes    Alcohol/week: 0.0 standard drinks    Comment: rarely  . Drug use: No  . Sexual activity: Not on file  Other Topics Concern  . Not on file  Social History Narrative  . Not on file   Social Determinants of Health   Financial Resource Strain:   . Difficulty of Paying Living Expenses: Not on file  Food Insecurity:   . Worried About Charity fundraiser in the Last Year: Not on file  . Ran Out of Food in the Last Year: Not on file  Transportation Needs:   . Lack of Transportation (Medical): Not on file  . Lack of Transportation (Non-Medical): Not on file  Physical Activity:   . Days of Exercise per Week: Not on file  . Minutes of Exercise per Session: Not on file  Stress:   . Feeling of Stress : Not on file  Social Connections:   . Frequency of Communication with Friends and Family: Not on file  . Frequency of Social Gatherings with Friends and Family: Not on file  . Attends Religious Services: Not on file  . Active Member of Clubs or Organizations: Not on file  . Attends Archivist Meetings: Not on file  . Marital Status: Not on file     Family History: The patient's family history includes Alcohol abuse in her brother; Breast cancer in her paternal aunt; Diabetes in her maternal grandmother and mother; Heart disease in her paternal grandmother; Kidney cancer in her father; Lung cancer in her paternal aunt; Stomach cancer in her father; Stroke in her maternal grandfather, maternal grandmother, and paternal grandfather. There is no history of Bladder Cancer or Prostate cancer.  ROS:    Please see the history of present illness.     All other systems reviewed and are negative.  EKGs/Labs/Other Studies Reviewed:    The following studies were reviewed today:   EKG:  EKG is  ordered today.  The ekg ordered today demonstrates sinus rhythm, LVH.  Recent Labs: 11/05/2019: ALT 25; B Natriuretic Peptide 214.2; BUN 26; Creatinine, Ser 0.68; Hemoglobin 13.0; Platelets 268; Potassium 4.4; Sodium 140  Recent Lipid Panel    Component Value Date/Time   CHOL 202 (H) 03/05/2019 1007   TRIG 83 03/05/2019 1007   HDL 74 03/05/2019 1007   CHOLHDL 2.7 03/05/2019 1007   VLDL 17 03/28/2016 0001   LDLCALC 111 (H) 03/05/2019  1007     Risk Assessment/Calculations:      Physical Exam:    VS:  BP 140/80 (BP Location: Right Wrist, Patient Position: Sitting, Cuff Size: Normal)   Pulse 98   Ht 5\' 5"  (1.651 m)   Wt (!) 321 lb 4 oz (145.7 kg)   SpO2 99%   BMI 53.46 kg/m     Wt Readings from Last 3 Encounters:  11/22/19 (!) 321 lb 4 oz (145.7 kg)  08/13/19 (!) 329 lb (149.2 kg)  07/01/19 (!) 326 lb 12.8 oz (148.2 kg)     GEN:  Well nourished, well developed in no acute distress HEENT: Normal NECK: No JVD; No carotid bruits LYMPHATICS: No lymphadenopathy CARDIAC: RRR, no murmurs, rubs, gallops RESPIRATORY: Decreased breath sounds at bases ABDOMEN: Soft, non-tender, non-distended MUSCULOSKELETAL:  No edema; No deformity  SKIN: Warm and dry NEUROLOGIC:  Alert and oriented x 3 PSYCHIATRIC:  Normal affect   ASSESSMENT:    1. Dyspnea, unspecified type   2. Primary hypertension   3. Morbid obesity ([place])   4. LVH (left ventricular hypertrophy)    PLAN:    In order of problems listed above:  1. Patient with dyspnea on exertion since acquiring Covid.  Risk factors of hypertension, morbid obesity.  Will get echo and Lexiscan Myoview to evaluate cardiac function.  Obesity/deconditioning might be contributing.  Also might have diastolic dysfunction in light of recent chest CT  showing pleural effusions.  Encouraged to take Lasix as prescribed. 2.  of hypertension, continue current BP meds. 3. Patient is morbidly obese, weight loss advised. 4. EKG shows LVH per voltage criteria, echocardiogram as above.  We will up after echo and Lexiscan.    Shared Decision Making/Informed Consent      Medication Adjustments/Labs and Tests Ordered: Current medicines are reviewed at length with the patient today.  Concerns regarding medicines are outlined above.  Orders Placed This Encounter  Procedures  . NM Myocar Multi W/Spect W/Wall Motion / EF  . EKG 12-Lead  . ECHOCARDIOGRAM COMPLETE   No orders of the defined types were placed in this encounter.   Patient Instructions  Medication Instructions:  Your physician recommends that you continue on your current medications as directed. Please refer to the Current Medication list given to you today.  *If you need a refill on your cardiac medications before your next appointment, please call your pharmacy*   Lab Work: None ordered.  If you have labs (blood work) drawn today and your tests are completely normal, you will receive your results only by: Marland Kitchen MyChart Message (if you have MyChart) OR . A paper copy in the mail If you have any lab test that is abnormal or we need to change your treatment, we will call you to review the results.   Testing/Procedures:  1) Your physician has requested that you have an echocardiogram. Echocardiography is a painless test that uses sound waves to create images of your heart. It provides your doctor with information about the size and shape of your heart and how well your heart's chambers and valves are working. This procedure takes approximately one hour. There are no restrictions for this procedure.   2) [place]       Your caregiver has ordered a Stress Test with nuclear imaging. The purpose of this test is to evaluate the blood supply to your heart muscle. This procedure  is referred to as a "Non-Invasive Stress Test." This is because other than having an IV started  in your vein, nothing is inserted or "invades" your body. Cardiac stress tests are done to find areas of poor blood flow to the heart by determining the extent of coronary artery disease (CAD). Some patients exercise on a treadmill, which naturally increases the blood flow to your heart, while others who are  unable to walk on a treadmill due to physical limitations have a pharmacologic/chemical stress agent called Lexiscan . This medicine will mimic walking on a treadmill by temporarily increasing your coronary blood flow.      PLEASE REPORT TO [facility] ENTRANCE   THE VOLUNTEERS AT THE FIRST DESK WILL DIRECT YOU WHERE TO GO     *Please note: these test may take anywhere between 2-4 hours to complete       Date of Procedure:_____________________________________   Arrival Time for Procedure:______________________________    PLEASE NOTIFY THE OFFICE AT LEAST 24 HOURS IN ADVANCE IF YOU ARE UNABLE TO KEEP YOUR APPOINTMENT.  [place] 24 HOURS IN ADVANCE IF YOU ARE UNABLE TO KEEP YOUR APPOINTMENT. [phone]         How to prepare for your Myoview test:         ____:  Hold diabetes medication the morning of procedure:   ____:  Hold betablocker(s) night before procedure and morning of procedure:     _XX__:  Hold other medications as follows:  furosemide (LASIX) 20 MG tablet & hydrochlorothiazide (HYDRODIURIL) 25 MG tablet    1. Do not eat or drink after midnight  2. No caffeine for 24 hours prior to test  3. No smoking 24 hours prior to test.  4. Unless instructed otherwise, Take your medication with a small sips of water 5.         Ladies, please do not wear dresses. Skirts or pants are appropriate. Please wear a short sleeve shirt.  6. No perfume, cologne or lotion.  7. Wear comfortable walking shoes. No heels!       Follow-Up: At [facility], you and your health needs are our priority.  As part of our continuing mission to provide you with exceptional heart care, we have created designated Provider Care Teams.  These Care Teams include your primary Cardiologist (physician) and Advanced Practice Providers (APPs -  Physician Assistants and Nurse Practitioners) who all work together to provide you with the care you need, when you need it.  We recommend signing up for the patient portal called "MyChart".  Sign up information is provided on this After Visit Summary.  MyChart is used to connect with patients for Virtual Visits (Telemedicine).  Patients are able to view lab/test results, encounter notes, upcoming appointments, etc.  Non-urgent messages can be sent to your provider as well.   To learn more about what you can do with MyChart, go to [email or web address].    Your next appointment:    Follow up AFTER Echo and Myoview  The format for your next appointment:   In Person  Provider:   Kate Sable, MD          Signed, Kate Sable, MD  11/22/2019 1:02 PM    [place]

## 2019-11-25 ENCOUNTER — Ambulatory Visit: Payer: 59 | Admitting: Cardiology

## 2019-11-26 ENCOUNTER — Encounter: Payer: Self-pay | Admitting: Family Medicine

## 2019-11-26 ENCOUNTER — Other Ambulatory Visit: Payer: Self-pay

## 2019-11-26 ENCOUNTER — Ambulatory Visit (INDEPENDENT_AMBULATORY_CARE_PROVIDER_SITE_OTHER): Payer: 59 | Admitting: Family Medicine

## 2019-11-26 DIAGNOSIS — R059 Cough, unspecified: Secondary | ICD-10-CM

## 2019-11-26 MED ORDER — PREDNISONE 10 MG PO TABS: ORAL_TABLET | ORAL | 0 refills | Status: AC

## 2019-11-26 NOTE — Assessment & Plan Note (Signed)
Likely secondary to PND from viral sinus infection.  Patient declines inhaler due to having increased heart rate when used last, requiring evaluation at the hospital.  Discussed likely viral process and has been going on < 2 days, would treat symptoms.  Will send in rx for prednisone taper over the next 9 days to help decrease inflammation and help her lungs.  To continue to take flonase and mucinex that she has at home and to RTC if symptoms worsen or fail to improve.

## 2019-11-26 NOTE — Progress Notes (Signed)
Virtual Visit via Telephone  The purpose of this virtual visit is to provide medical care while limiting exposure to the novel coronavirus (COVID19) for both patient and office staff.  Consent was obtained for phone visit:  Yes.   Answered questions that patient had about telehealth interaction:  Yes.   I discussed the limitations, risks, security and privacy concerns of performing an evaluation and management service by telephone. I also discussed with the patient that there may be a patient responsible charge related to this service. The patient expressed understanding and agreed to proceed.  Patient is at home and is accessed via telephone Services are provided by Harlin Rain, FNP-C from [facility])  ---------------------------------------------------------------------- Chief Complaint  Patient presents with  . Cough    severe headaches, greenish mucus, productive coughing w/ greenish phelgm x 2 days. The patient seems to think its an sinus infection    S: Reviewed CMA documentation. I have called patient and gathered additional HPI as follows:  Shannon Small presents for virtual telemedicine visit via phone.  Shannon Small has concerns for a bad headache for about 1 week or so, yesterday she started to notice some green scabs that were coming out of her nose.  Has been coughing up some green colored phlegm since yesterday.  Reports believes she has a sinus infection.  Was diagnosed with COVID on 10/08/2019 and recently was treated with azithromycin on 11/06/2019 by her PCP for bronchitis.   Patient is currently home Denies any high risk travel to areas of current concern for COVID19. Denies any known or suspected exposure to person with or possibly with COVID19.  Denies any fevers, chills, sweats, body ache, cough, shortness of breath, abdominal pain, diarrhea  Past Medical History:  Diagnosis Date  . Acid reflux 09/09/2014  . Adjustment disorder with  depressed mood 11/14/2006   ASSESSMENT: Bereavement. Recommend counseling with pastor or psychologist.   . Benign neoplasm of skin 11/14/2006  . Cephalalgia 02/10/2006  . Chronic infection of sinus 09/09/2014  . Extreme obesity 02/10/2006  . Hypertension   . LBP (low back pain) 01/15/2007   Chronic back strain and spasms due to obesity.   . Sleep related leg cramps 09/09/2014   Social History   Tobacco Use  . Smoking status: Never Smoker  . Smokeless tobacco: Never Used  Vaping Use  . Vaping Use: Never used  Substance Use Topics  . Alcohol use: Yes    Alcohol/week: 0.0 standard drinks    Comment: rarely  . Drug use: No    Current Outpatient Medications:  .  acetaminophen (TYLENOL) 500 MG tablet, Take 2 tablets (1,000 mg total) by mouth every 8 (eight) hours as needed for mild pain, moderate pain, fever or headache., Disp: 50 tablet, Rfl: 0 .  cyclobenzaprine (FLEXERIL) 10 MG tablet, Take 1 tablet (10 mg total) by mouth 3 (three) times daily as needed., Disp: 90 tablet, Rfl: 1 .  furosemide (LASIX) 20 MG tablet, Take 1-2 tablets (20-40 mg total) by mouth daily as needed for fluid or edema., Disp: 30 tablet, Rfl: 1 .  hydrochlorothiazide (HYDRODIURIL) 25 MG tablet, Take 1 tablet by mouth once daily, Disp: 90 tablet, Rfl: 3 .  hydrOXYzine (ATARAX/VISTARIL) 25 MG tablet, Take 1 tablet (25 mg total) by mouth 3 (three) times daily as needed for itching., Disp: 60 tablet, Rfl: 2 .  ibuprofen (ADVIL) 800 MG tablet, TAKE 1 TABLET BY MOUTH EVERY 8 HOURS AS NEEDED, Disp: 90 tablet, Rfl: 0 .  ketoconazole (NIZORAL) 2 % cream, APPLY TOPICALLY DAILY AS NEEDED FOR  IRRITATION, Disp: 30 g, Rfl: 0 .  lisinopril (ZESTRIL) 20 MG tablet, Take 1 tablet by mouth once daily, Disp: 90 tablet, Rfl: 3 .  clotrimazole-betamethasone (LOTRISONE) cream, APPLY TOPICALLY TO GENITAL AREA FOR VAGINAL ITCHING AND IRRITATION FOR UP TO 2 WEEKS, THEN STOP (Patient not taking: Reported on 11/26/2019), Disp: 45 g, Rfl: 3 .   docusate sodium (COLACE) 100 MG capsule, Take 1 capsule (100 mg total) by mouth 2 (two) times daily. (Patient not taking: Reported on 11/26/2019), Disp: 60 capsule, Rfl: 0 .  hydrocortisone 2.5 % cream, APPLY  CREAM TO AFFECTED AREA TWICE DAILY AS NEEDED ECZEMA (Patient not taking: Reported on 11/26/2019), Disp: 28 g, Rfl: 2 .  orphenadrine (NORFLEX) 100 MG tablet, TAKE 1 TABLET BY MOUTH TWICE DAILY AS NEEDED FOR MUSCLE SPASM (Patient not taking: Reported on 11/26/2019), Disp: 60 tablet, Rfl: 0 .  predniSONE (DELTASONE) 10 MG tablet, Take 3 tablets (30 mg total) by mouth daily with breakfast for 3 days, THEN 2 tablets (20 mg total) daily with breakfast for 3 days, THEN 1 tablet (10 mg total) daily with breakfast for 3 days., Disp: 18 tablet, Rfl: 0  Depression screen [facility] 2/9 03/05/2019 01/28/2019 08/20/2018  Decreased Interest 0 0 0  Down, Depressed, Hopeless 0 0 0  PHQ - 2 Score 0 0 0  Altered sleeping 0 0 -  Tired, decreased energy 0 0 -  Change in appetite 0 0 -  Feeling bad or failure about yourself  0 0 -  Trouble concentrating 0 0 -  Moving slowly or fidgety/restless 0 0 -  Suicidal thoughts 0 0 -  PHQ-9 Score 0 0 -  Difficult doing work/chores Not difficult at all Not difficult at all -    GAD 7 : Generalized Anxiety Score 03/05/2019  Nervous, Anxious, on Edge 0  Control/stop worrying 0  Worry too much - different things 0  Trouble relaxing 0  Restless 0  Easily annoyed or irritable 0  Afraid - awful might happen 0  Total GAD 7 Score 0  Anxiety Difficulty Not difficult at all    -------------------------------------------------------------------------- O: No physical exam performed due to remote telephone encounter.  Physical Exam: Patient remotely monitored without video.  Verbal communication appropriate.  Cognition normal.  Recent Results (from the past 2160 hour(s))  Brain natriuretic peptide     Status: Abnormal   Collection Time: 11/05/19  5:58 AM  Result Value Ref  Range   B Natriuretic Peptide 214.2 (H) 0.0 - 100.0 pg/mL    Comment: Performed at [facility], [address] [place], [place] [address]  CBC with Differential     Status: Abnormal   Collection Time: 11/05/19  5:59 AM  Result Value Ref Range   WBC 11.4 (H) 4.0 - 10.5 K/uL   RBC 4.73 3.87 - 5.11 MIL/uL   Hemoglobin 13.0 12.0 - 15.0 g/dL   HCT 40.8 36 - 46 %   MCV 86.3 80.0 - 100.0 fL   MCH 27.5 26.0 - 34.0 pg   MCHC 31.9 30.0 - 36.0 g/dL   RDW 13.9 11.5 - 15.5 %   Platelets 268 150 - 400 K/uL   nRBC 0.0 0.0 - 0.2 %   Neutrophils Relative % 88 %   Neutro Abs 10.0 (H) 1.7 - 7.7 K/uL   Lymphocytes Relative 10 %   Lymphs Abs 1.2 0.7 - 4.0 K/uL   Monocytes Relative 1 %  Monocytes Absolute 0.1 0.1 - 1.0 K/uL   Eosinophils Relative 0 %   Eosinophils Absolute 0.0 0.0 - 0.5 K/uL   Basophils Relative 0 %   Basophils Absolute 0.0 0.0 - 0.1 K/uL   Immature Granulocytes 1 %   Abs Immature Granulocytes 0.09 (H) 0.00 - 0.07 K/uL    Comment: Performed at [facility], [address], [place], [place] [address]  Comprehensive metabolic panel     Status: Abnormal   Collection Time: 11/05/19  5:59 AM  Result Value Ref Range   Sodium 140 135 - 145 mmol/L   Potassium 4.4 3.5 - 5.1 mmol/L   Chloride 109 98 - 111 mmol/L   CO2 21 (L) 22 - 32 mmol/L   Glucose, Bld 156 (H) 70 - 99 mg/dL    Comment: Glucose reference range applies only to samples taken after fasting for at least 8 hours.   BUN 26 (H) 6 - 20 mg/dL   Creatinine, Ser 0.68 0.44 - 1.00 mg/dL   Calcium 9.2 8.9 - 10.3 mg/dL   Total Protein 7.8 6.5 - 8.1 g/dL   Albumin 3.8 3.5 - 5.0 g/dL   AST 28 15 - 41 U/L   ALT 25 0 - 44 U/L   Alkaline Phosphatase 74 38 - 126 U/L   Total Bilirubin 0.6 0.3 - 1.2 mg/dL   GFR, Estimated >60 >60 mL/min    Comment: (NOTE) Calculated using the CKD-EPI Creatinine Equation (2021)    Anion gap 10 5 - 15    Comment: Performed at [facility], [address], [place] [address]  Troponin I (High Sensitivity)     Status: Abnormal   Collection Time: 11/05/19  5:59 AM  Result Value Ref Range   Troponin I (High Sensitivity) 51 (H) <18 ng/L    Comment: (NOTE) Elevated high sensitivity troponin I (hsTnI) values and significant  changes across serial measurements may suggest ACS but many other  chronic and acute conditions are known to elevate hsTnI results.  Refer to the "Links" section for chest pain algorithms and additional  guidance. Performed at [facility], [address] [place], [place] [address]   Fibrin derivatives D-Dimer     Status: Abnormal   Collection Time: 11/05/19  5:59 AM  Result Value Ref Range   Fibrin derivatives D-dimer ([facility]) 622.52 (H) 0.00 - 499.00 ng/mL (FEU)    Comment: (NOTE) <> Exclusion of Venous Thromboembolism (VTE) - OUTPATIENT ONLY   (Emergency Department or [place])    0-499 ng/ml (FEU): With a low to intermediate pretest probability                      for VTE this test result excludes the diagnosis                      of VTE.   >499 ng/ml (FEU) : VTE not excluded; additional work up for VTE is                      required.  <> Testing on Inpatients and Evaluation of Disseminated Intravascular   Coagulation (DIC) Reference Range:   0-499 ng/ml (FEU) Performed at [facility], [address] [place], [place] [address]   Urinalysis, Complete w Microscopic Urine, Clean Catch     Status: Abnormal   Collection Time: 11/05/19  7:28 AM  Result Value Ref Range   Color, Urine COLORLESS (A) YELLOW  APPearance CLEAR (A) CLEAR   Specific Gravity, Urine 1.005 1.005 - 1.030   pH 7.0 5.0 - 8.0   Glucose, UA NEGATIVE NEGATIVE mg/dL   Hgb urine dipstick NEGATIVE NEGATIVE   Bilirubin Urine NEGATIVE NEGATIVE   Ketones, ur NEGATIVE NEGATIVE mg/dL   Protein, ur NEGATIVE NEGATIVE mg/dL   Nitrite NEGATIVE NEGATIVE   Leukocytes,Ua NEGATIVE NEGATIVE   RBC / HPF 0-5 0 - 5 RBC/hpf   WBC, UA  NONE SEEN 0 - 5 WBC/hpf   Bacteria, UA NONE SEEN NONE SEEN   Squamous Epithelial / LPF NONE SEEN 0 - 5    Comment: Performed at [facility], [place], [place] [address]  Troponin I (High Sensitivity)     Status: Abnormal   Collection Time: 11/05/19  8:06 AM  Result Value Ref Range   Troponin I (High Sensitivity) 65 (H) <18 ng/L    Comment: (NOTE) Elevated high sensitivity troponin I (hsTnI) values and significant  changes across serial measurements may suggest ACS but many other  chronic and acute conditions are known to elevate hsTnI results.  Refer to the "Links" section for chest pain algorithms and additional  guidance. Performed at [facility], [address] [place], [place] [address]     -------------------------------------------------------------------------- A&P:  Problem List Items Addressed This Visit      Other   Cough - Primary    Likely secondary to PND from viral sinus infection.  Patient declines inhaler due to having increased heart rate when used last, requiring evaluation at the hospital.  Discussed likely viral process and has been going on < 2 days, would treat symptoms.  Will send in rx for prednisone taper over the next 9 days to help decrease inflammation and help her lungs.  To continue to take flonase and mucinex that she has at home and to RTC if symptoms worsen or fail to improve.      Relevant Medications   predniSONE (DELTASONE) 10 MG tablet      Meds ordered this encounter  Medications  . predniSONE (DELTASONE) 10 MG tablet    Sig: Take 3 tablets (30 mg total) by mouth daily with breakfast for 3 days, THEN 2 tablets (20 mg total) daily with breakfast for 3 days, THEN 1 tablet (10 mg total) daily with breakfast for 3 days.    Dispense:  18 tablet    Refill:  0    Follow-up: - Return if symptoms worsen or fail to improve  Patient verbalizes understanding with the above medical recommendations including  the limitation of remote medical advice.  Specific follow-up and call-back criteria were given for patient to follow-up or seek medical care more urgently if needed.  - Time spent in direct consultation with patient on phone: 9 minutes  Harlin Rain, [facility] 11/26/2019, 3:24 PM

## 2019-11-29 ENCOUNTER — Telehealth: Payer: Self-pay | Admitting: Cardiology

## 2019-11-29 NOTE — Telephone Encounter (Signed)
Spoke with patient and informed her that is was okay to continue taking the steroids, that it would not inhibit her Myoview.  Patient was grateful for the call back.

## 2019-11-29 NOTE — Telephone Encounter (Signed)
Patient was recently placed on steroids by her PCP trying to open airway. Patient began taking these 11/18 and needs to know if she needs to stop due to her myoview Monday  Please advise

## 2019-12-02 ENCOUNTER — Encounter
Admission: RE | Admit: 2019-12-02 | Discharge: 2019-12-02 | Disposition: A | Discharge status: Home / Self Care | Payer: 59 | Source: Ambulatory Visit | Attending: Cardiology | Admitting: Cardiology

## 2019-12-02 ENCOUNTER — Other Ambulatory Visit: Payer: Self-pay

## 2019-12-02 DIAGNOSIS — I10 Essential (primary) hypertension: Secondary | ICD-10-CM | POA: Diagnosis not present

## 2019-12-02 DIAGNOSIS — R0600 Dyspnea, unspecified: Secondary | ICD-10-CM | POA: Insufficient documentation

## 2019-12-02 MED ORDER — REGADENOSON 0.4 MG/5ML IV SOLN
0.4000 mg | Freq: Once | INTRAVENOUS | Status: AC
  Administered 2019-12-02: 0.4 mg via INTRAVENOUS
  Filled 2019-12-02: qty 5

## 2019-12-02 MED ORDER — TECHNETIUM TC 99M TETROFOSMIN IV KIT
30.0000 | PACK | Freq: Once | INTRAVENOUS | Status: AC | PRN
  Administered 2019-12-02: 29.84 via INTRAVENOUS

## 2019-12-03 ENCOUNTER — Other Ambulatory Visit: Payer: Self-pay

## 2019-12-03 ENCOUNTER — Other Ambulatory Visit: Payer: Self-pay | Admitting: Family Medicine

## 2019-12-03 ENCOUNTER — Encounter
Admission: RE | Admit: 2019-12-03 | Discharge: 2019-12-03 | Disposition: A | Discharge status: Home / Self Care | Payer: 59 | Source: Ambulatory Visit | Attending: Cardiology | Admitting: Cardiology

## 2019-12-03 DIAGNOSIS — S39012A Strain of muscle, fascia and tendon of lower back, initial encounter: Secondary | ICD-10-CM

## 2019-12-03 DIAGNOSIS — M62830 Muscle spasm of back: Secondary | ICD-10-CM

## 2019-12-03 DIAGNOSIS — B372 Candidiasis of skin and nail: Secondary | ICD-10-CM

## 2019-12-03 DIAGNOSIS — I872 Venous insufficiency (chronic) (peripheral): Secondary | ICD-10-CM

## 2019-12-03 MED ORDER — TECHNETIUM TC 99M TETROFOSMIN IV KIT
28.8200 | PACK | Freq: Once | INTRAVENOUS | Status: AC | PRN
  Administered 2019-12-03: 28.82 via INTRAVENOUS

## 2019-12-03 NOTE — Telephone Encounter (Signed)
Requested medication (s) are due for refill today:yes  Requested medication (s) are on the active medication list: yes   Last refill: 08/28/19  #60  0 refills  Future visit scheduled no  Notes to clinic:  Not delegated  Requested Prescriptions  Pending Prescriptions Disp Refills   orphenadrine (NORFLEX) 100 MG tablet [Pharmacy Med Name: Orphenadrine Citrate ER 100 MG Oral Tablet Extended Release 12 Hour] 60 tablet 0    Sig: TAKE 1 TABLET BY MOUTH TWICE DAILY AS NEEDED FOR MUSCLE SPASM      Not Delegated - Analgesics:  Muscle Relaxants Failed - 12/03/2019  6:57 PM      Failed - This refill cannot be delegated      Passed - Valid encounter within last 6 months    Recent Outpatient Visits           1 week ago Cough   [facility] [place], Lupita Raider, FNP   3 weeks ago Cough due to bronchospasm   [place], DO   1 month ago Cough due to bronchospasm   [facility], Lupita Raider, FNP   1 month ago COVID-19 virus infection   [facility] [place], Devonne Doughty, DO   3 months ago Acute non-recurrent maxillary sinusitis   [place], DO       Future Appointments             In 3 weeks Agbor-Etang, Aaron Edelman, MD [facility], [facility]              triamcinolone cream (KENALOG) 0.5 % [Pharmacy Med Name: Triamcinolone Acetonide 0.5 % External Cream] 30 g 0    Sig: APPLY  CREAM TOPICALLY TO AFFECTED AREA TWICE DAILY FOR  UP  TO  2  WEEKS      Dermatology:  Corticosteroids Passed - 12/03/2019  6:57 PM      Passed - Valid encounter within last 12 months    Recent Outpatient Visits           1 week ago Cough   [facility] [place], Lupita Raider, FNP   3 weeks ago Cough due to bronchospasm   [employer], DO   1 month ago Cough due to bronchospasm   [facility],  Lupita Raider, FNP   1 month ago COVID-19 virus infection   [facility] [place], Devonne Doughty, DO   3 months ago Acute non-recurrent maxillary sinusitis   [place], DO       Future Appointments             In 3 weeks Agbor-Etang, Aaron Edelman, MD [facility], [facility]             Signed Prescriptions Disp Refills   ibuprofen (ADVIL) 800 MG tablet 90 tablet 1    Sig: TAKE 1 TABLET BY MOUTH EVERY 8 HOURS AS NEEDED      Analgesics:  NSAIDS Passed - 12/03/2019  6:57 PM      Passed - Cr in normal range and within 360 days    Creat  Date Value Ref Range Status  03/05/2019 0.63 0.50 - 1.05 mg/dL Final    Comment:    For patients >53 years of age, the reference limit for Creatinine is approximately 13% higher for people identified as African-American. Marland Kitchen  Creatinine, Ser  Date Value Ref Range Status  11/05/2019 0.68 0.44 - 1.00 mg/dL Final          Passed - HGB in normal range and within 360 days    Hemoglobin  Date Value Ref Range Status  11/05/2019 13.0 12.0 - 15.0 g/dL Final   HGB  Date Value Ref Range Status  07/30/2013 12.3 12.0 - 16.0 g/dL Final          Passed - Patient is not pregnant      Passed - Valid encounter within last 12 months    Recent Outpatient Visits           1 week ago Cough   [facility], Lupita Raider, FNP   3 weeks ago Cough due to bronchospasm   [place], DO   1 month ago Cough due to bronchospasm   [place], FNP   1 month ago COVID-19 virus infection   [facility] [place], Devonne Doughty, DO   3 months ago Acute non-recurrent maxillary sinusitis   [place], DO       Future Appointments             In 3 weeks Agbor-Etang, Aaron Edelman, MD [facility], [facility]               ketoconazole (NIZORAL) 2 % cream 30 g 1    Sig: APPLY  CREAM TOPICALLY ONCE DAILY AS NEEDED FOR  IRRITATION      Over the Counter:  OTC Passed - 12/03/2019  6:57 PM      Passed - Valid encounter within last 12 months    Recent Outpatient Visits           1 week ago Cough   [facility] [place], Lupita Raider, FNP   3 weeks ago Cough due to bronchospasm   [place], DO   1 month ago Cough due to bronchospasm   [place], FNP   1 month ago COVID-19 virus infection   [place], DO   3 months ago Acute non-recurrent maxillary sinusitis   [place], Devonne Doughty, DO       Future Appointments             In 3 weeks Agbor-Etang, Aaron Edelman, MD [facility], [place]

## 2019-12-04 ENCOUNTER — Telehealth: Payer: Self-pay | Admitting: Cardiology

## 2019-12-04 NOTE — Telephone Encounter (Signed)
Patient calling back to [facility] results

## 2019-12-04 NOTE — Telephone Encounter (Signed)
Patient calling to check on status.

## 2019-12-09 LAB — NM MYOCAR MULTI W/SPECT W/WALL MOTION / EF
Estimated workload: 1 METS
Exercise duration (min): 0 min
Exercise duration (sec): 0 s
LV dias vol: 267 mL (ref 46–106)
LV sys vol: 197 mL
MPHR: 162 {beats}/min
Peak HR: 112 {beats}/min
Percent HR: 69 %
Rest HR: 84 {beats}/min
SDS: 11
SRS: 6
SSS: 10
TID: 1.13

## 2019-12-09 NOTE — Telephone Encounter (Signed)
Spoke with patient and gave her Dr. Bernette Redbird result note from her Myoview. She also stated that she could not make it to her Echo scheduled for 12/19/19. We rescheduled her for 12/31/19, and her follow up accordingly.

## 2019-12-16 ENCOUNTER — Encounter: Payer: Self-pay | Admitting: Family Medicine

## 2019-12-16 ENCOUNTER — Ambulatory Visit (INDEPENDENT_AMBULATORY_CARE_PROVIDER_SITE_OTHER): Payer: 59 | Admitting: Family Medicine

## 2019-12-16 ENCOUNTER — Other Ambulatory Visit: Payer: Self-pay

## 2019-12-16 VITALS — BP 150/71 | HR 94 | Temp 97.7°F | Resp 16 | Ht 65.0 in | Wt 326.0 lb

## 2019-12-16 DIAGNOSIS — F4323 Adjustment disorder with mixed anxiety and depressed mood: Secondary | ICD-10-CM

## 2019-12-16 DIAGNOSIS — J0110 Acute frontal sinusitis, unspecified: Secondary | ICD-10-CM | POA: Diagnosis not present

## 2019-12-16 MED ORDER — BENZONATATE 200 MG PO CAPS: 200.0000 mg | ORAL_CAPSULE | Freq: Two times a day (BID) | ORAL | 0 refills | Status: DC | PRN

## 2019-12-16 MED ORDER — SERTRALINE HCL 50 MG PO TABS: 50.0000 mg | ORAL_TABLET | Freq: Every day | ORAL | 2 refills | Status: DC

## 2019-12-16 MED ORDER — AMOXICILLIN-POT CLAVULANATE 875-125 MG PO TABS: 1.0000 | ORAL_TABLET | Freq: Two times a day (BID) | ORAL | 0 refills | Status: DC

## 2019-12-16 NOTE — Patient Instructions (Addendum)
Thank you for coming to the office today.  Start Sertraline generic Sertraline 50mg  daily in morning with meal. For next 2-4 weeks may gradually improve mood and anxiety, should take full effect by 1 month, we can adjust dose in future if need, contact us by 4 weeks. If you are needing more counseling help, questions, switch of med or other - we should talk again, if it is just to notify us that you are doing well and inc dose we can do that no problem.  Start Augmentin for sinuses 10 day course  Add Tessalon perl refill.  Use vaseline and neosporin on nose  Please schedule a Follow-up Appointment to: Return in about 4 weeks (around 01/13/2020), or if symptoms worsen or fail to improve, for 4 week as needed anxiety/mood adjustment.  If you have any other questions or concerns, please feel free to call the office or send a message through [place]. You may also schedule an earlier appointment if necessary.  Additionally, you may be receiving a survey about your experience at our office within a few days to 1 week by e-mail or mail. We value your feedback.  [name], DO [place]
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
## 2019-12-17 ENCOUNTER — Telehealth: Payer: Self-pay

## 2019-12-17 NOTE — Telephone Encounter (Signed)
Can you notify patient that - work note was added to MyChart - it was sent to patient on mychart and she should have access to it there as of this morning now 12/17/19  If she still needs an actual paper copy printed out and signed, let me know.  [name], [facility] 12/17/2019, 10:45 AM

## 2019-12-17 NOTE — Telephone Encounter (Signed)
Copied from [place] [id number]. Topic: Quick Communication - See Telephone Encounter >> Dec 16, 2019  3:39 PM [name] wrote: CRM for notification. See Telephone encounter for: 12/16/19. Pt Had been treated for a sinus infection today and had asked for a note for Sunday the 5th not working due to sickness. Pt wants the note mailed to her Shannon Small address as she does not feel like coming to get it.

## 2019-12-17 NOTE — Telephone Encounter (Signed)
Copied from [place] [id number]. Topic: Quick Communication - See Telephone Encounter >> Dec 16, 2019  3:39 PM Shannon Small wrote: CRM for notification. See Telephone encounter for: 12/16/19. Pt Had been treated for a sinus infection today and had asked for a note for Sunday the 5th not working due to sickness. Pt wants the note mailed to her Shannon Small address as she does not feel like coming to get it.

## 2019-12-17 NOTE — Telephone Encounter (Signed)
Patient wants letter mailed.

## 2019-12-19 ENCOUNTER — Other Ambulatory Visit: Payer: 59

## 2019-12-20 ENCOUNTER — Telehealth: Payer: Self-pay | Admitting: Family Medicine

## 2019-12-20 DIAGNOSIS — J9801 Acute bronchospasm: Secondary | ICD-10-CM

## 2019-12-20 MED ORDER — PREDNISONE 20 MG PO TABS: ORAL_TABLET | ORAL | 0 refills | Status: DC

## 2019-12-20 NOTE — Telephone Encounter (Signed)
Patient notified

## 2019-12-20 NOTE — Telephone Encounter (Signed)
Pt stated she is in need of Prednisone due to being very horse and can hardly talk/ please advise

## 2019-12-20 NOTE — Telephone Encounter (Signed)
Please notify patient.  Sent prednisone to her pharmacy, 7 day taper. Take with other meds, finish course. Able to prescribe this now because we had just evaluated her recently.  Follow-up if not improving, or may seek care sooner at urgent care  [name], [facility] 12/20/2019, 11:36 AM

## 2019-12-26 ENCOUNTER — Ambulatory Visit: Payer: 59 | Admitting: Cardiology

## 2019-12-31 ENCOUNTER — Other Ambulatory Visit: Payer: 59

## 2020-01-02 NOTE — Telephone Encounter (Signed)
Should proceed to UC for evaluation

## 2020-01-02 NOTE — Telephone Encounter (Signed)
Pt has called in wanting Dr Raliegh Ip to call in more medication as this is not helping her at all, "wants something much stronger"  thinks getting worse Pt states symptoms worsening are coughing and congestion...cough med not working at all.  Pt sounded  SOB, upon questioning pt stated that getting dressed in the mornings she is very out of breath, but then gets a little better, Told pt that nurse would cb. [phone]

## 2020-01-02 NOTE — Telephone Encounter (Signed)
The pt refuse to be evaluated again. She said she was told that Dr. Raliegh Ip. will call in something stronger if her symptoms doesn't improve. Dr Raliegh Ip note states that the patient need to return if symptoms doesn't improve.

## 2020-01-06 ENCOUNTER — Telehealth: Payer: Self-pay | Admitting: Family Medicine

## 2020-01-06 DIAGNOSIS — T3695XA Adverse effect of unspecified systemic antibiotic, initial encounter: Secondary | ICD-10-CM

## 2020-01-06 MED ORDER — FLUCONAZOLE 150 MG PO TABS: ORAL_TABLET | ORAL | 1 refills | Status: DC

## 2020-01-06 NOTE — Telephone Encounter (Signed)
Patient notified

## 2020-01-06 NOTE — Telephone Encounter (Signed)
Please notify patient that Diflucan was sent to her [facility] pharmacy  Saralyn Pilar, DO [facility] 01/06/2020, 12:27 PM

## 2020-01-06 NOTE — Telephone Encounter (Signed)
Pt states all the abx she was put on has given her a yeast infection.  She is requesting something to help her.  She states she is burning and itching.  [place] ([place]), [place] - [address] Phone:  [phone]  Fax:  [phone]

## 2020-01-09 ENCOUNTER — Ambulatory Visit: Payer: 59 | Admitting: Cardiology

## 2020-01-17 ENCOUNTER — Other Ambulatory Visit: Payer: Self-pay | Admitting: Family Medicine

## 2020-01-17 DIAGNOSIS — S39012A Strain of muscle, fascia and tendon of lower back, initial encounter: Secondary | ICD-10-CM

## 2020-01-17 DIAGNOSIS — I872 Venous insufficiency (chronic) (peripheral): Secondary | ICD-10-CM

## 2020-01-17 DIAGNOSIS — B372 Candidiasis of skin and nail: Secondary | ICD-10-CM

## 2020-01-17 DIAGNOSIS — M62830 Muscle spasm of back: Secondary | ICD-10-CM

## 2020-01-17 NOTE — Telephone Encounter (Signed)
Requested medication (s) are due for refill today: yes  Requested medication (s) are on the active medication list:yes  Last refill:  12/04/2019  Future visit scheduled: no  Notes to clinic:  this refill cannot be delegated    Requested Prescriptions  Pending Prescriptions Disp Refills   orphenadrine (NORFLEX) 100 MG tablet [Pharmacy Med Name: Orphenadrine Citrate ER 100 MG Oral Tablet Extended Release 12 Hour] 60 tablet 0    Sig: TAKE 1 TABLET BY MOUTH TWICE DAILY AS NEEDED FOR MUSCLE SPASM      Not Delegated - Analgesics:  Muscle Relaxants Failed - 01/17/2020 11:25 AM      Failed - This refill cannot be delegated      Passed - Valid encounter within last 6 months    Recent Outpatient Visits           1 month ago Acute non-recurrent frontal sinusitis   [facility] [place], Devonne Doughty, DO   1 month ago Cough   [facility], Lupita Raider, FNP   2 months ago Cough due to bronchospasm   [place], DO   2 months ago Cough due to bronchospasm   [facility], Lupita Raider, FNP   3 months ago COVID-19 virus infection   [place], DO       Future Appointments             In 3 weeks Agbor-Etang, Aaron Edelman, MD [facility]              Signed Prescriptions Disp Refills   ketoconazole (NIZORAL) 2 % cream 30 g 0    Sig: APPLY  CREAM TOPICALLY ONCE DAILY AS NEEDED FOR IRRITATION      Over the Counter:  OTC Passed - 01/17/2020 11:25 AM      Passed - Valid encounter within last 12 months    Recent Outpatient Visits           1 month ago Acute non-recurrent frontal sinusitis   [facility], Devonne Doughty, DO   1 month ago Cough   [facility], [place], FNP   2 months ago Cough due to bronchospasm   [place], DO   2 months  ago Cough due to bronchospasm   [place], FNP   3 months ago COVID-19 virus infection   [place], DO       Future Appointments             In 3 weeks Agbor-Etang, Aaron Edelman, MD [facility], [facility]               triamcinolone cream (KENALOG) 0.5 % 30 g 0    Sig: APPLY CREAM TOPICALLY TO AFFECTED AREA TWICE DAILY FOR UP TO 2 WEEKS      Dermatology:  Corticosteroids Passed - 01/17/2020 11:25 AM      Passed - Valid encounter within last 12 months    Recent Outpatient Visits           1 month ago Acute non-recurrent frontal sinusitis   [place], DO   1 month ago Cough   [facility] [place], Lupita Raider, FNP   2 months ago Cough due to bronchospasm   [facility]  Olin Hauser, DO   2 months ago Cough due to bronchospasm   [place], FNP   3 months ago COVID-19 virus infection   [place], DO       Future Appointments             In 3 weeks Agbor-Etang, Aaron Edelman, MD [facility], [facility]

## 2020-01-24 ENCOUNTER — Ambulatory Visit: Payer: 59 | Admitting: Family Medicine

## 2020-02-04 ENCOUNTER — Ambulatory Visit (INDEPENDENT_AMBULATORY_CARE_PROVIDER_SITE_OTHER): Payer: 59

## 2020-02-04 ENCOUNTER — Other Ambulatory Visit: Payer: Self-pay

## 2020-02-04 DIAGNOSIS — R0600 Dyspnea, unspecified: Secondary | ICD-10-CM | POA: Diagnosis not present

## 2020-02-05 LAB — ECHOCARDIOGRAM COMPLETE
AR max vel: 1.61 cm2
AV Area VTI: 1.53 cm2
AV Area mean vel: 1.58 cm2
AV Mean grad: 5 mmHg
AV Peak grad: 9.1 mmHg
Ao pk vel: 1.51 m/s
Area-P 1/2: 4.24 cm2
S' Lateral: 4.97 cm
Single Plane A4C EF: 27.1 %

## 2020-02-06 ENCOUNTER — Telehealth: Payer: Self-pay | Admitting: Cardiology

## 2020-02-06 NOTE — Telephone Encounter (Signed)
Patient calling for echo results.       Shannon Sable, MD  02/05/2020 7:08 PM EST      Please advise patient that her ejection fraction was moderate to severely reduced. Have patient take Lasix as prescribed. Keep follow-up appointment. We will have to adjust her medications and schedule left heart cath. Thank you

## 2020-02-06 NOTE — Telephone Encounter (Signed)
Called patient and gave her the following result note: Please advise patient that her ejection fraction was moderate to severely reduced. Have patient take Lasix as prescribed. Keep follow-up appointment. We will have to adjust her medications and schedule left heart cath. Thank you  Patient verbalized understanding and agreed with plan.

## 2020-02-11 ENCOUNTER — Other Ambulatory Visit: Payer: Self-pay

## 2020-02-11 ENCOUNTER — Ambulatory Visit (INDEPENDENT_AMBULATORY_CARE_PROVIDER_SITE_OTHER): Payer: 59 | Admitting: Cardiology

## 2020-02-11 ENCOUNTER — Encounter: Payer: Self-pay | Admitting: Cardiology

## 2020-02-11 ENCOUNTER — Other Ambulatory Visit: Payer: Self-pay | Admitting: Family Medicine

## 2020-02-11 VITALS — BP 130/62 | HR 85 | Ht 65.0 in | Wt 324.0 lb

## 2020-02-11 DIAGNOSIS — I10 Essential (primary) hypertension: Secondary | ICD-10-CM | POA: Diagnosis not present

## 2020-02-11 DIAGNOSIS — I5020 Unspecified systolic (congestive) heart failure: Secondary | ICD-10-CM

## 2020-02-11 MED ORDER — METOPROLOL SUCCINATE ER 25 MG PO TB24: 25.0000 mg | ORAL_TABLET | Freq: Every day | ORAL | 5 refills | Status: DC

## 2020-02-11 MED ORDER — SODIUM CHLORIDE 0.9% FLUSH: 3.0000 mL | Freq: Two times a day (BID) | INTRAVENOUS | Status: AC

## 2020-02-11 NOTE — Progress Notes (Signed)
Cardiology Office Note:    Date:  02/11/2020   ID:  Shannon Small, DOB 20-Feb-1960, MRN [id number]  PCP:  Olin Hauser, DO  [place] Cardiologist:  No primary care provider on file.  [facility] Electrophysiologist:  None   Referring MD: [name] *   Chief Complaint  Patient presents with  . Follow-up    Myoview & Echo results. Patient c/o shortness of breath, chest pain and rapid irregular heart beats "fluttering" at times. Medications reviewed by the patient verbally.     History of Present Illness:    Shannon Small is a 60 y.o. female with a hx of hypertension, obesity, GERD, chronic back pain who presents for follow-up.  Last seen due to shortness of breath.  Symptoms began after acquiring COVID-19.  Also has some stressors in the family with brother having stage IV colon cancer.  Due to risk factors, echo and Myoview was ordered to evaluate cardiac function and presence of ischemia.  Still has shortness of breath with exertion.  Prior notes Evaluated in the ED for dyspnea, chest CTA was negative for PE, small pleural effusions noted.  Started on diuretics with improvement in symptoms, patient stopped taking diuretics due to frequent bathroom use.   Past Medical History:  Diagnosis Date  . Acid reflux 09/09/2014  . Adjustment disorder with depressed mood 11/14/2006   ASSESSMENT: Bereavement. Recommend counseling with pastor or psychologist.   . Benign neoplasm of skin 11/14/2006  . Cephalalgia 02/10/2006  . Chronic infection of sinus 09/09/2014  . Extreme obesity 02/10/2006  . Hypertension   . LBP (low back pain) 01/15/2007   Chronic back strain and spasms due to obesity.   . Sleep related leg cramps 09/09/2014    Past Surgical History:  Procedure Laterality Date  . ABDOMINAL HYSTERECTOMY    . RHINOPLASTY  1982    Current Medications: Current Meds  Medication Sig  . benzonatate (TESSALON) 200 MG capsule Take 1 capsule (200 mg total) by mouth  2 (two) times daily as needed for cough.  . clotrimazole-betamethasone (LOTRISONE) cream APPLY TOPICALLY TO GENITAL AREA FOR VAGINAL ITCHING AND IRRITATION FOR UP TO 2 WEEKS, THEN STOP  . cyclobenzaprine (FLEXERIL) 10 MG tablet Take 1 tablet (10 mg total) by mouth 3 (three) times daily as needed.  . furosemide (LASIX) 20 MG tablet Take 1-2 tablets (20-40 mg total) by mouth daily as needed for fluid or edema. (Patient taking differently: Take 20 mg by mouth daily.)  . hydrocortisone 2.5 % cream APPLY  CREAM TO AFFECTED AREA TWICE DAILY AS NEEDED ECZEMA  . hydrOXYzine (ATARAX/VISTARIL) 25 MG tablet Take 1 tablet (25 mg total) by mouth 3 (three) times daily as needed for itching.  Marland Kitchen ibuprofen (ADVIL) 800 MG tablet TAKE 1 TABLET BY MOUTH EVERY 8 HOURS AS NEEDED  . ketoconazole (NIZORAL) 2 % cream APPLY  CREAM TOPICALLY ONCE DAILY AS NEEDED FOR IRRITATION  . lisinopril (ZESTRIL) 20 MG tablet Take 1 tablet by mouth once daily  . metoprolol succinate (TOPROL XL) 25 MG 24 hr tablet Take 1 tablet (25 mg total) by mouth daily.  . orphenadrine (NORFLEX) 100 MG tablet TAKE 1 TABLET BY MOUTH TWICE DAILY AS NEEDED FOR MUSCLE SPASM  . triamcinolone cream (KENALOG) 0.5 % APPLY CREAM TOPICALLY TO AFFECTED AREA TWICE DAILY FOR UP TO 2 WEEKS   Current Facility-Administered Medications for the 02/11/20 encounter (Office Visit) with Kate Sable, MD  Medication  . sodium chloride flush (NS) 0.9 % injection  3 mL     Allergies:   Patient has no known allergies.   Social History   Socioeconomic History  . Marital status: Married    Spouse name: Not on file  . Number of children: Not on file  . Years of education: Not on file  . Highest education level: Not on file  Occupational History  . Not on file  Tobacco Use  . Smoking status: Never Smoker  . Smokeless tobacco: Never Used  Vaping Use  . Vaping Use: Never used  Substance and Sexual Activity  . Alcohol use: Yes    Alcohol/week: 0.0 standard  drinks    Comment: rarely  . Drug use: No  . Sexual activity: Not on file  Other Topics Concern  . Not on file  Social History Narrative  . Not on file   Social Determinants of Health   Financial Resource Strain: Not on file  Food Insecurity: Not on file  Transportation Needs: Not on file  Physical Activity: Not on file  Stress: Not on file  Social Connections: Not on file     Family History: The patient's family history includes Alcohol abuse in her brother; Breast cancer in her paternal aunt; Diabetes in her maternal grandmother and mother; Heart disease in her paternal grandmother; Kidney cancer in her father; Lung cancer in her paternal aunt; Stomach cancer in her father; Stroke in her maternal grandfather, maternal grandmother, and paternal grandfather. There is no history of Bladder Cancer or Prostate cancer.  ROS:   Please see the history of present illness.     All other systems reviewed and are negative.  EKGs/Labs/Other Studies Reviewed:    The following studies were reviewed today:   EKG:  EKG is  ordered today.  The ekg ordered today demonstrates sinus rhythm, PACs  Recent Labs: 11/05/2019: ALT 25; B Natriuretic Peptide 214.2; BUN 26; Creatinine, Ser 0.68; Hemoglobin 13.0; Platelets 268; Potassium 4.4; Sodium 140  Recent Lipid Panel    Component Value Date/Time   CHOL 202 (H) 03/05/2019 1007   TRIG 83 03/05/2019 1007   HDL 74 03/05/2019 1007   CHOLHDL 2.7 03/05/2019 1007   VLDL 17 03/28/2016 0001   LDLCALC 111 (H) 03/05/2019 1007     Risk Assessment/Calculations:      Physical Exam:    VS:  BP 130/62 (BP Location: Left Wrist, Patient Position: Sitting, Cuff Size: Normal)   Pulse 85   Ht 5\' 5"  (1.651 m)   Wt (!) 324 lb (147 kg)   SpO2 98%   BMI 53.92 kg/m     Wt Readings from Last 3 Encounters:  02/11/20 (!) 324 lb (147 kg)  12/16/19 (!) 326 lb (147.9 kg)  11/22/19 (!) 321 lb 4 oz (145.7 kg)     GEN:  Well nourished, well developed in no  acute distress HEENT: Normal NECK: No JVD; No carotid bruits LYMPHATICS: No lymphadenopathy CARDIAC: RRR, no murmurs, rubs, gallops RESPIRATORY: Decreased breath sounds at bases ABDOMEN: Soft, non-tender, non-distended MUSCULOSKELETAL:  No edema; No deformity  SKIN: Warm and dry NEUROLOGIC:  Alert and oriented x 3 PSYCHIATRIC:  Normal affect   ASSESSMENT:    1. HFrEF (heart failure with reduced ejection fraction) ([place])   2. Primary hypertension   3. Morbid obesity ([place])    PLAN:    In order of problems listed above:  1. Dyspnea on exertion, echo showed moderate to severely reduced EF, EF 30 to 35%.  [place] with no significant ischemia, EF  was low at 29%.  Stop lisinopril, start Entresto 24-26 mg twice daily.  Start Toprol-XL 25 mg daily.  Continue Lasix 20 mg daily.  Plan for right and left heart cath.  If cost is an issue regarding [name], will plan to continue lisinopril. 2. hypertension, Toprol-XL, Entresto.  3. Patient is morbidly obese, weight loss advised.  Follow-up after right and left heart cath.    Shared Decision Making/Informed Consent      Medication Adjustments/Labs and Tests Ordered: Current medicines are reviewed at length with the patient today.  Concerns regarding medicines are outlined above.  Orders Placed This Encounter  Procedures  . Basic metabolic panel  . CBC  . EKG 12-Lead   Meds ordered this encounter  Medications  . metoprolol succinate (TOPROL XL) 25 MG 24 hr tablet    Sig: Take 1 tablet (25 mg total) by mouth daily.    Dispense:  30 tablet    Refill:  5  . sodium chloride flush (NS) 0.9 % injection 3 mL    Patient Instructions  Medication Instructions:   1.  START taking Toprol XL 25 MG: Take one tab by mouth daily.  *If you need a refill on your cardiac medications before your next appointment, please call your pharmacy*   Lab Work: 1.  BMP, CBC drawn today.  2.  Please drive through our COVID pre-testing site in  the front [facility] on Friday 02/14/20 between      the hours of 8AM-1PM.   Testing/Procedures:   You are scheduled for a Cardiac Catheterization on Monday, February 7 with Dr. Kathlyn [place].  1. Please arrive at the [facility] at 8:30 AM. Free valet parking service is available.   Special note: Every effort is made to have your procedure done on time. Please understand that emergencies sometimes delay scheduled procedures.  2. Diet: Do not eat solid foods after midnight.  The patient may have clear liquids until 5am upon the day of the procedure.  3. Medication instructions in preparation for your procedure:      HOLD your Lasix and your Lisinopril the day of your procedure.   5. Plan for one night stay--bring personal belongings. 6. Bring a current list of your medications and current insurance cards. 7. You MUST have a responsible person to drive you home. 8. Someone MUST be with you the first 24 hours after you arrive home or your discharge will be delayed. 9. Please wear clothes that are easy to get on and off and wear slip-on shoes.   Follow-Up: At [facility], you and your health needs are our priority.  As part of our continuing mission to provide you with exceptional heart care, we have created designated Provider Care Teams.  These Care Teams include your primary Cardiologist (physician) and Advanced Practice Providers (APPs -  Physician Assistants and Nurse Practitioners) who all work together to provide you with the care you need, when you need it.  We recommend signing up for the patient portal called "MyChart".  Sign up information is provided on this After Visit Summary.  MyChart is used to connect with patients for Virtual Visits (Telemedicine).  Patients are able to view lab/test results, encounter notes, upcoming appointments, etc.  Non-urgent messages can be sent to your provider as well.   To learn more about what you can do with MyChart, go to  [email or web address].    Your next appointment:   2-3 weeks   The format for  your next appointment:   In Person  Provider:   Kate Sable, MD   Other Instructions      Signed, Kate Sable, MD  02/11/2020 11:06 AM    [place]

## 2020-02-11 NOTE — H&P (View-Only) (Signed)
Cardiology Office Note:    Date:  02/11/2020   ID:  Shannon Small, DOB 20-Feb-1960, MRN [id number]  PCP:  Olin Hauser, DO  [place] Cardiologist:  No primary care provider on file.  [facility] Electrophysiologist:  None   Referring MD: [name] *   Chief Complaint  Patient presents with  . Follow-up    Myoview & Echo results. Patient c/o shortness of breath, chest pain and rapid irregular heart beats "fluttering" at times. Medications reviewed by the patient verbally.     History of Present Illness:    Shannon Small is a 60 y.o. female with a hx of hypertension, obesity, GERD, chronic back pain who presents for follow-up.  Last seen due to shortness of breath.  Symptoms began after acquiring COVID-19.  Also has some stressors in the family with brother having stage IV colon cancer.  Due to risk factors, echo and Myoview was ordered to evaluate cardiac function and presence of ischemia.  Still has shortness of breath with exertion.  Prior notes Evaluated in the ED for dyspnea, chest CTA was negative for PE, small pleural effusions noted.  Started on diuretics with improvement in symptoms, patient stopped taking diuretics due to frequent bathroom use.   Past Medical History:  Diagnosis Date  . Acid reflux 09/09/2014  . Adjustment disorder with depressed mood 11/14/2006   ASSESSMENT: Bereavement. Recommend counseling with pastor or psychologist.   . Benign neoplasm of skin 11/14/2006  . Cephalalgia 02/10/2006  . Chronic infection of sinus 09/09/2014  . Extreme obesity 02/10/2006  . Hypertension   . LBP (low back pain) 01/15/2007   Chronic back strain and spasms due to obesity.   . Sleep related leg cramps 09/09/2014    Past Surgical History:  Procedure Laterality Date  . ABDOMINAL HYSTERECTOMY    . RHINOPLASTY  1982    Current Medications: Current Meds  Medication Sig  . benzonatate (TESSALON) 200 MG capsule Take 1 capsule (200 mg total) by mouth  2 (two) times daily as needed for cough.  . clotrimazole-betamethasone (LOTRISONE) cream APPLY TOPICALLY TO GENITAL AREA FOR VAGINAL ITCHING AND IRRITATION FOR UP TO 2 WEEKS, THEN STOP  . cyclobenzaprine (FLEXERIL) 10 MG tablet Take 1 tablet (10 mg total) by mouth 3 (three) times daily as needed.  . furosemide (LASIX) 20 MG tablet Take 1-2 tablets (20-40 mg total) by mouth daily as needed for fluid or edema. (Patient taking differently: Take 20 mg by mouth daily.)  . hydrocortisone 2.5 % cream APPLY  CREAM TO AFFECTED AREA TWICE DAILY AS NEEDED ECZEMA  . hydrOXYzine (ATARAX/VISTARIL) 25 MG tablet Take 1 tablet (25 mg total) by mouth 3 (three) times daily as needed for itching.  Marland Kitchen ibuprofen (ADVIL) 800 MG tablet TAKE 1 TABLET BY MOUTH EVERY 8 HOURS AS NEEDED  . ketoconazole (NIZORAL) 2 % cream APPLY  CREAM TOPICALLY ONCE DAILY AS NEEDED FOR IRRITATION  . lisinopril (ZESTRIL) 20 MG tablet Take 1 tablet by mouth once daily  . metoprolol succinate (TOPROL XL) 25 MG 24 hr tablet Take 1 tablet (25 mg total) by mouth daily.  . orphenadrine (NORFLEX) 100 MG tablet TAKE 1 TABLET BY MOUTH TWICE DAILY AS NEEDED FOR MUSCLE SPASM  . triamcinolone cream (KENALOG) 0.5 % APPLY CREAM TOPICALLY TO AFFECTED AREA TWICE DAILY FOR UP TO 2 WEEKS   Current Facility-Administered Medications for the 02/11/20 encounter (Office Visit) with Kate Sable, MD  Medication  . sodium chloride flush (NS) 0.9 % injection  3 mL     Allergies:   Patient has no known allergies.   Social History   Socioeconomic History  . Marital status: Married    Spouse name: Not on file  . Number of children: Not on file  . Years of education: Not on file  . Highest education level: Not on file  Occupational History  . Not on file  Tobacco Use  . Smoking status: Never Smoker  . Smokeless tobacco: Never Used  Vaping Use  . Vaping Use: Never used  Substance and Sexual Activity  . Alcohol use: Yes    Alcohol/week: 0.0 standard  drinks    Comment: rarely  . Drug use: No  . Sexual activity: Not on file  Other Topics Concern  . Not on file  Social History Narrative  . Not on file   Social Determinants of Health   Financial Resource Strain: Not on file  Food Insecurity: Not on file  Transportation Needs: Not on file  Physical Activity: Not on file  Stress: Not on file  Social Connections: Not on file     Family History: The patient's family history includes Alcohol abuse in her brother; Breast cancer in her paternal aunt; Diabetes in her maternal grandmother and mother; Heart disease in her paternal grandmother; Kidney cancer in her father; Lung cancer in her paternal aunt; Stomach cancer in her father; Stroke in her maternal grandfather, maternal grandmother, and paternal grandfather. There is no history of Bladder Cancer or Prostate cancer.  ROS:   Please see the history of present illness.     All other systems reviewed and are negative.  EKGs/Labs/Other Studies Reviewed:    The following studies were reviewed today:   EKG:  EKG is  ordered today.  The ekg ordered today demonstrates sinus rhythm, PACs  Recent Labs: 11/05/2019: ALT 25; B Natriuretic Peptide 214.2; BUN 26; Creatinine, Ser 0.68; Hemoglobin 13.0; Platelets 268; Potassium 4.4; Sodium 140  Recent Lipid Panel    Component Value Date/Time   CHOL 202 (H) 03/05/2019 1007   TRIG 83 03/05/2019 1007   HDL 74 03/05/2019 1007   CHOLHDL 2.7 03/05/2019 1007   VLDL 17 03/28/2016 0001   LDLCALC 111 (H) 03/05/2019 1007     Risk Assessment/Calculations:      Physical Exam:    VS:  BP 130/62 (BP Location: Left Wrist, Patient Position: Sitting, Cuff Size: Normal)   Pulse 85   Ht 5\' 5"  (1.651 m)   Wt (!) 324 lb (147 kg)   SpO2 98%   BMI 53.92 kg/m     Wt Readings from Last 3 Encounters:  02/11/20 (!) 324 lb (147 kg)  12/16/19 (!) 326 lb (147.9 kg)  11/22/19 (!) 321 lb 4 oz (145.7 kg)     GEN:  Well nourished, well developed in no  acute distress HEENT: Normal NECK: No JVD; No carotid bruits LYMPHATICS: No lymphadenopathy CARDIAC: RRR, no murmurs, rubs, gallops RESPIRATORY: Decreased breath sounds at bases ABDOMEN: Soft, non-tender, non-distended MUSCULOSKELETAL:  No edema; No deformity  SKIN: Warm and dry NEUROLOGIC:  Alert and oriented x 3 PSYCHIATRIC:  Normal affect   ASSESSMENT:    1. HFrEF (heart failure with reduced ejection fraction) ([place])   2. Primary hypertension   3. Morbid obesity ([place])    PLAN:    In order of problems listed above:  1. Dyspnea on exertion, echo showed moderate to severely reduced EF, EF 30 to 35%.  [place] with no significant ischemia, EF  was low at 29%.  Stop lisinopril, start Entresto 24-26 mg twice daily.  Start Toprol-XL 25 mg daily.  Continue Lasix 20 mg daily.  Plan for right and left heart cath.  If cost is an issue regarding [name], will plan to continue lisinopril. 2. hypertension, Toprol-XL, Entresto.  3. Patient is morbidly obese, weight loss advised.  Follow-up after right and left heart cath.    Shared Decision Making/Informed Consent      Medication Adjustments/Labs and Tests Ordered: Current medicines are reviewed at length with the patient today.  Concerns regarding medicines are outlined above.  Orders Placed This Encounter  Procedures  . Basic metabolic panel  . CBC  . EKG 12-Lead   Meds ordered this encounter  Medications  . metoprolol succinate (TOPROL XL) 25 MG 24 hr tablet    Sig: Take 1 tablet (25 mg total) by mouth daily.    Dispense:  30 tablet    Refill:  5  . sodium chloride flush (NS) 0.9 % injection 3 mL    Patient Instructions  Medication Instructions:   1.  START taking Toprol XL 25 MG: Take one tab by mouth daily.  *If you need a refill on your cardiac medications before your next appointment, please call your pharmacy*   Lab Work: 1.  BMP, CBC drawn today.  2.  Please drive through our COVID pre-testing site in  the [facility] on Friday 02/14/20 between      the hours of 8AM-1PM.   Testing/Procedures:   You are scheduled for a Cardiac Catheterization on Monday, February 7 with Dr. Kathlyn Sacramento.  1. Please arrive at the [facility] at 8:30 AM. Free valet parking service is available.   Special note: Every effort is made to have your procedure done on time. Please understand that emergencies sometimes delay scheduled procedures.  2. Diet: Do not eat solid foods after midnight.  The patient may have clear liquids until 5am upon the day of the procedure.  3. Medication instructions in preparation for your procedure:      HOLD your Lasix and your Lisinopril the day of your procedure.   5. Plan for one night stay--bring personal belongings. 6. Bring a current list of your medications and current insurance cards. 7. You MUST have a responsible person to drive you home. 8. Someone MUST be with you the first 24 hours after you arrive home or your discharge will be delayed. 9. Please wear clothes that are easy to get on and off and wear slip-on shoes.   Follow-Up: At [facility], you and your health needs are our priority.  As part of our continuing mission to provide you with exceptional heart care, we have created designated Provider Care Teams.  These Care Teams include your primary Cardiologist (physician) and Advanced Practice Providers (APPs -  Physician Assistants and Nurse Practitioners) who all work together to provide you with the care you need, when you need it.  We recommend signing up for the patient portal called "MyChart".  Sign up information is provided on this After Visit Summary.  MyChart is used to connect with patients for Virtual Visits (Telemedicine).  Patients are able to view lab/test results, encounter notes, upcoming appointments, etc.  Non-urgent messages can be sent to your provider as well.   To learn more about what you can do with MyChart, go to  [email or web address].    Your next appointment:   2-3 weeks   The format for  your next appointment:   In Person  Provider:   Kate Sable, MD   Other Instructions      Signed, Kate Sable, MD  02/11/2020 11:06 AM    [place]

## 2020-02-11 NOTE — Patient Instructions (Signed)
Medication Instructions:   1.  START taking Toprol XL 25 MG: Take one tab by mouth daily.  *If you need a refill on your cardiac medications before your next appointment, please call your pharmacy*   Lab Work: 1.  BMP, CBC drawn today.  2.  Please drive through our COVID pre-testing site in the front [facility] on Friday 02/14/20 between      the hours of 8AM-1PM.   Testing/Procedures:   You are scheduled for a Cardiac Catheterization on Monday, February 7 with Dr. Kathlyn Sacramento.  1. Please arrive at the [facility] at 8:30 AM. Free valet parking service is available.   Special note: Every effort is made to have your procedure done on time. Please understand that emergencies sometimes delay scheduled procedures.  2. Diet: Do not eat solid foods after midnight.  The patient may have clear liquids until 5am upon the day of the procedure.  3. Medication instructions in preparation for your procedure:      HOLD your Lasix and your Lisinopril the day of your procedure.   5. Plan for one night stay--bring personal belongings. 6. Bring a current list of your medications and current insurance cards. 7. You MUST have a responsible person to drive you home. 8. Someone MUST be with you the first 24 hours after you arrive home or your discharge will be delayed. 9. Please wear clothes that are easy to get on and off and wear slip-on shoes.   Follow-Up: At [facility], you and your health needs are our priority.  As part of our continuing mission to provide you with exceptional heart care, we have created designated Provider Care Teams.  These Care Teams include your primary Cardiologist (physician) and Advanced Practice Providers (APPs -  Physician Assistants and Nurse Practitioners) who all work together to provide you with the care you need, when you need it.  We recommend signing up for the patient portal called "MyChart".  Sign up information is provided on this  After Visit Summary.  MyChart is used to connect with patients for Virtual Visits (Telemedicine).  Patients are able to view lab/test results, encounter notes, upcoming appointments, etc.  Non-urgent messages can be sent to your provider as well.   To learn more about what you can do with MyChart, go to [email or web address].    Your next appointment:   2-3 weeks   The format for your next appointment:   In Person  Provider:   Kate Sable, MD   Other Instructions

## 2020-02-12 LAB — CBC
Hematocrit: 41.6 % (ref 34.0–46.6)
Hemoglobin: 13.8 g/dL (ref 11.1–15.9)
MCH: 28.3 pg (ref 26.6–33.0)
MCHC: 33.2 g/dL (ref 31.5–35.7)
MCV: 85 fL (ref 79–97)
Platelets: 228 10*3/uL (ref 150–450)
RBC: 4.88 x10E6/uL (ref 3.77–5.28)
RDW: 12.7 % (ref 11.7–15.4)
WBC: 9.6 10*3/uL (ref 3.4–10.8)

## 2020-02-12 LAB — BASIC METABOLIC PANEL
BUN/Creatinine Ratio: 26 — ABNORMAL HIGH (ref 9–23)
BUN: 19 mg/dL (ref 6–24)
CO2: 22 mmol/L (ref 20–29)
Calcium: 9.4 mg/dL (ref 8.7–10.2)
Chloride: 102 mmol/L (ref 96–106)
Creatinine, Ser: 0.74 mg/dL (ref 0.57–1.00)
GFR calc Af Amer: 103 mL/min/{1.73_m2} (ref >59)
GFR calc non Af Amer: 89 mL/min/{1.73_m2} (ref >59)
Glucose: 96 mg/dL (ref 65–99)
Potassium: 4 mmol/L (ref 3.5–5.2)
Sodium: 137 mmol/L (ref 134–144)

## 2020-02-13 ENCOUNTER — Other Ambulatory Visit
Admission: RE | Admit: 2020-02-13 | Discharge: 2020-02-13 | Disposition: A | Discharge status: Home / Self Care | Payer: 59 | Source: Ambulatory Visit | Attending: Cardiology | Admitting: Cardiology

## 2020-02-13 ENCOUNTER — Other Ambulatory Visit: Payer: Self-pay

## 2020-02-13 ENCOUNTER — Telehealth: Payer: Self-pay | Admitting: Cardiovascular Disease

## 2020-02-13 DIAGNOSIS — Z01812 Encounter for preprocedural laboratory examination: Secondary | ICD-10-CM | POA: Insufficient documentation

## 2020-02-13 DIAGNOSIS — Z20822 Contact with and (suspected) exposure to covid-19: Secondary | ICD-10-CM | POA: Diagnosis not present

## 2020-02-13 NOTE — Telephone Encounter (Signed)
Shannon Small spoke with her and she is getting her test done today. Orders were called in.

## 2020-02-13 NOTE — Telephone Encounter (Signed)
Patient calling in stating that she wants to get her covid test done this morning for her cath on 2/7. Patient will be in town today and does not want to come out tomorrow. Please advise as the covid test does not seem to be scheduled at all

## 2020-02-14 LAB — SARS CORONAVIRUS 2 (TAT 6-24 HRS): SARS Coronavirus 2: NEGATIVE

## 2020-02-17 ENCOUNTER — Other Ambulatory Visit: Payer: Self-pay

## 2020-02-17 ENCOUNTER — Ambulatory Visit
Admission: RE | Admit: 2020-02-17 | Discharge: 2020-02-17 | Disposition: A | Discharge status: Home / Self Care | Payer: 59 | Attending: Cardiovascular Disease | Admitting: Cardiovascular Disease

## 2020-02-17 ENCOUNTER — Encounter: Payer: Self-pay | Admitting: Cardiovascular Disease

## 2020-02-17 ENCOUNTER — Encounter
Admission: RE | Disposition: A | Discharge status: Home / Self Care | Payer: Self-pay | Source: Home / Self Care | Attending: Cardiovascular Disease

## 2020-02-17 DIAGNOSIS — I2510 Atherosclerotic heart disease of native coronary artery without angina pectoris: Secondary | ICD-10-CM | POA: Diagnosis not present

## 2020-02-17 DIAGNOSIS — I272 Pulmonary hypertension, unspecified: Secondary | ICD-10-CM | POA: Diagnosis not present

## 2020-02-17 DIAGNOSIS — I428 Other cardiomyopathies: Secondary | ICD-10-CM | POA: Diagnosis not present

## 2020-02-17 DIAGNOSIS — R0609 Other forms of dyspnea: Secondary | ICD-10-CM | POA: Insufficient documentation

## 2020-02-17 DIAGNOSIS — I5020 Unspecified systolic (congestive) heart failure: Secondary | ICD-10-CM

## 2020-02-17 DIAGNOSIS — Z8616 Personal history of COVID-19: Secondary | ICD-10-CM | POA: Insufficient documentation

## 2020-02-17 DIAGNOSIS — Z79899 Other long term (current) drug therapy: Secondary | ICD-10-CM | POA: Diagnosis not present

## 2020-02-17 DIAGNOSIS — I110 Hypertensive heart disease with heart failure: Secondary | ICD-10-CM | POA: Insufficient documentation

## 2020-02-17 DIAGNOSIS — Z6841 Body Mass Index (BMI) 40.0 and over, adult: Secondary | ICD-10-CM | POA: Diagnosis not present

## 2020-02-17 DIAGNOSIS — Z90710 Acquired absence of both cervix and uterus: Secondary | ICD-10-CM | POA: Insufficient documentation

## 2020-02-17 DIAGNOSIS — R600 Localized edema: Secondary | ICD-10-CM

## 2020-02-17 HISTORY — PX: RIGHT/LEFT HEART CATH AND CORONARY ANGIOGRAPHY: CATH118266

## 2020-02-17 SURGERY — RIGHT/LEFT HEART CATH AND CORONARY ANGIOGRAPHY
Anesthesia: Moderate Sedation

## 2020-02-17 MED ORDER — FENTANYL CITRATE (PF) 100 MCG/2ML IJ SOLN
INTRAMUSCULAR | Status: DC | PRN
  Administered 2020-02-17: 25 ug via INTRAVENOUS
  Administered 2020-02-17: 50 ug via INTRAVENOUS

## 2020-02-17 MED ORDER — LABETALOL HCL 5 MG/ML IV SOLN: 10.0000 mg | INTRAVENOUS | Status: DC | PRN

## 2020-02-17 MED ORDER — HEPARIN SODIUM (PORCINE) 1000 UNIT/ML IJ SOLN
INTRAMUSCULAR | Status: DC | PRN
  Administered 2020-02-17: 6000 [IU] via INTRAVENOUS

## 2020-02-17 MED ORDER — SODIUM CHLORIDE 0.9 % IV SOLN: 250.0000 mL | INTRAVENOUS | Status: DC | PRN

## 2020-02-17 MED ORDER — MIDAZOLAM HCL 2 MG/2ML IJ SOLN
INTRAMUSCULAR | Status: AC
  Filled 2020-02-17: qty 2

## 2020-02-17 MED ORDER — FUROSEMIDE 40 MG PO TABS: 40.0000 mg | ORAL_TABLET | Freq: Every day | ORAL | 3 refills | Status: DC

## 2020-02-17 MED ORDER — VERAPAMIL HCL 2.5 MG/ML IV SOLN
INTRAVENOUS | Status: DC | PRN
  Administered 2020-02-17: 2.5 mg via INTRA_ARTERIAL

## 2020-02-17 MED ORDER — HEPARIN (PORCINE) IN NACL 1000-0.9 UT/500ML-% IV SOLN
INTRAVENOUS | Status: DC | PRN
  Administered 2020-02-17: 500 mL

## 2020-02-17 MED ORDER — SODIUM CHLORIDE 0.9 % IV SOLN: INTRAVENOUS | Status: DC

## 2020-02-17 MED ORDER — SODIUM CHLORIDE 0.9% FLUSH: 3.0000 mL | INTRAVENOUS | Status: DC | PRN

## 2020-02-17 MED ORDER — FENTANYL CITRATE (PF) 100 MCG/2ML IJ SOLN
INTRAMUSCULAR | Status: AC
  Filled 2020-02-17: qty 2

## 2020-02-17 MED ORDER — ACETAMINOPHEN 325 MG PO TABS: 650.0000 mg | ORAL_TABLET | ORAL | Status: DC | PRN

## 2020-02-17 MED ORDER — VERAPAMIL HCL 2.5 MG/ML IV SOLN
INTRAVENOUS | Status: AC
  Filled 2020-02-17: qty 2

## 2020-02-17 MED ORDER — LIDOCAINE HCL (PF) 1 % IJ SOLN
INTRAMUSCULAR | Status: AC
  Filled 2020-02-17: qty 30

## 2020-02-17 MED ORDER — MIDAZOLAM HCL 2 MG/2ML IJ SOLN
INTRAMUSCULAR | Status: DC | PRN
  Administered 2020-02-17: 0.5 mg via INTRAVENOUS
  Administered 2020-02-17: 1 mg via INTRAVENOUS

## 2020-02-17 MED ORDER — IOHEXOL 300 MG/ML  SOLN
INTRAMUSCULAR | Status: DC | PRN
  Administered 2020-02-17: 50 mL

## 2020-02-17 MED ORDER — HEPARIN SODIUM (PORCINE) 1000 UNIT/ML IJ SOLN
INTRAMUSCULAR | Status: AC
  Filled 2020-02-17: qty 1

## 2020-02-17 MED ORDER — SODIUM CHLORIDE 0.9% FLUSH: 3.0000 mL | Freq: Two times a day (BID) | INTRAVENOUS | Status: DC

## 2020-02-17 MED ORDER — HEPARIN (PORCINE) IN NACL 1000-0.9 UT/500ML-% IV SOLN
INTRAVENOUS | Status: AC
  Filled 2020-02-17: qty 1000

## 2020-02-17 MED ORDER — LIDOCAINE HCL (PF) 1 % IJ SOLN
INTRAMUSCULAR | Status: DC | PRN
  Administered 2020-02-17: 8 mL

## 2020-02-17 MED ORDER — ONDANSETRON HCL 4 MG/2ML IJ SOLN: 4.0000 mg | Freq: Four times a day (QID) | INTRAMUSCULAR | Status: DC | PRN

## 2020-02-17 MED ORDER — ACETAMINOPHEN 325 MG PO TABS
ORAL_TABLET | ORAL | Status: AC
  Administered 2020-02-17: 650 mg via ORAL
  Filled 2020-02-17: qty 2

## 2020-02-17 MED ORDER — ACETAMINOPHEN 325 MG PO TABS: 650.0000 mg | ORAL_TABLET | Freq: Once | ORAL | Status: AC

## 2020-02-17 SURGICAL SUPPLY — 10 items
CATH BALLN WEDGE 5F 110CM (CATHETERS) ×2 IMPLANT
CATH INFINITI 5FR JK (CATHETERS) ×2 IMPLANT
DEVICE RAD TR BAND REGULAR (VASCULAR PRODUCTS) ×2 IMPLANT
GLIDESHEATH SLEND SS 6F .021 (SHEATH) ×2 IMPLANT
GUIDEWIRE INQWIRE 1.5J.035X260 (WIRE) ×1 IMPLANT
INQWIRE 1.5J .035X260CM (WIRE) ×2
KIT MANI 3VAL PERCEP (MISCELLANEOUS) ×2 IMPLANT
PACK CARDIAC CATH (CUSTOM PROCEDURE TRAY) ×2 IMPLANT
PANNUS RETENTION SYSTEM 2 PAD (MISCELLANEOUS) ×2 IMPLANT
SHEATH GLIDE SLENDER 4/5FR (SHEATH) ×2 IMPLANT

## 2020-02-17 NOTE — Interval H&P Note (Signed)
History and Physical Interval Note:  02/17/2020 9:55 AM  Shannon Small  has presented today for surgery, with the diagnosis of RT LT Heart Cath   HF with reduced EF.  The various methods of treatment have been discussed with the patient and family. After consideration of risks, benefits and other options for treatment, the patient has consented to  Procedure(s): RIGHT/LEFT HEART CATH AND CORONARY ANGIOGRAPHY (N/A) as a surgical intervention.  The patient's history has been reviewed, patient examined, no change in status, stable for surgery.  I have reviewed the patient's chart and labs.  Questions were answered to the patient's satisfaction.     Kathlyn Sacramento

## 2020-02-21 ENCOUNTER — Telehealth: Payer: Self-pay | Admitting: Cardiology

## 2020-02-21 NOTE — Telephone Encounter (Signed)
Called patient back and spoke with her. She stated that she had blisters on her wrist since the day of her procedure. I asked how long she had worn a bandage for and she replied that she had just removed it this morning . I informed her that it is most likely a reaction to her bandaid and she stated that she did have an allergy to adhesive. She informed me that she removed the bandage they sent her home with the day after and then applied a bandaid of her own. I instructed her to keep it clean and dry, to wash with warm soapy water and let it remain open to air. She has been putting neosporin on it and I confirmed for her that that would be okay. She will notify us on Monday if the site is not healing and getting any worse.  Patient was grateful for the calll back and verbalized understanding .

## 2020-02-21 NOTE — Telephone Encounter (Signed)
Patient reporting reaction s/p RIGHT/LEFT HEART CATH AND CORONARY ANGIOGRAPHY Monday 02/17/20   Patient reports oozing blisters near site.  Patient thinks she may be allergic to medication used during procedure and wants advise.    Please call .

## 2020-02-27 ENCOUNTER — Ambulatory Visit: Payer: 59 | Admitting: Cardiology

## 2020-03-06 ENCOUNTER — Other Ambulatory Visit: Payer: Self-pay | Admitting: Family Medicine

## 2020-03-06 ENCOUNTER — Other Ambulatory Visit: Payer: Self-pay

## 2020-03-06 ENCOUNTER — Encounter: Payer: Self-pay | Admitting: Cardiology

## 2020-03-06 ENCOUNTER — Ambulatory Visit (INDEPENDENT_AMBULATORY_CARE_PROVIDER_SITE_OTHER): Payer: 59 | Admitting: Cardiology

## 2020-03-06 VITALS — BP 140/80 | HR 78 | Ht 65.0 in | Wt 330.4 lb

## 2020-03-06 DIAGNOSIS — I428 Other cardiomyopathies: Secondary | ICD-10-CM | POA: Diagnosis not present

## 2020-03-06 DIAGNOSIS — S39012A Strain of muscle, fascia and tendon of lower back, initial encounter: Secondary | ICD-10-CM

## 2020-03-06 DIAGNOSIS — B372 Candidiasis of skin and nail: Secondary | ICD-10-CM

## 2020-03-06 DIAGNOSIS — M62830 Muscle spasm of back: Secondary | ICD-10-CM

## 2020-03-06 DIAGNOSIS — I10 Essential (primary) hypertension: Secondary | ICD-10-CM

## 2020-03-06 DIAGNOSIS — I872 Venous insufficiency (chronic) (peripheral): Secondary | ICD-10-CM

## 2020-03-06 MED ORDER — SPIRONOLACTONE 25 MG PO TABS: 12.5000 mg | ORAL_TABLET | Freq: Every day | ORAL | 5 refills | Status: DC

## 2020-03-06 NOTE — Telephone Encounter (Signed)
Notes to clinic: Review for topical cream and shampoo. Looks like a short supply was giving Review for continued use   Requested Prescriptions  Pending Prescriptions Disp Refills   triamcinolone cream (KENALOG) 0.5 % [Pharmacy Med Name: Triamcinolone Acetonide 0.5 % External Cream] 30 g 0    Sig: APPLY CREAM TOPICALLY TO AFFECTED AREA TWICE DAILY FOR UP TO 2 WEEKS      Dermatology:  Corticosteroids Passed - 03/06/2020  9:10 AM      Passed - Valid encounter within last 12 months    Recent Outpatient Visits           2 months ago Acute non-recurrent frontal sinusitis   [facility], Devonne Doughty, DO   3 months ago Cough   [facility], [place], FNP   4 months ago Cough due to bronchospasm   [place], DO   4 months ago Cough due to bronchospasm   [place], FNP   4 months ago COVID-19 virus infection   [place], DO       Future Appointments             Today Kate Sable, MD [place], [facility]               ibuprofen (ADVIL) 800 MG tablet [Pharmacy Med Name: Ibuprofen 800 MG Oral Tablet] 90 tablet 0    Sig: TAKE 1 TABLET BY MOUTH EVERY 8 HOURS AS NEEDED      Analgesics:  NSAIDS Passed - 03/06/2020  9:10 AM      Passed - Cr in normal range and within 360 days    Creat  Date Value Ref Range Status  03/05/2019 0.63 0.50 - 1.05 mg/dL Final    Comment:    For patients >68 years of age, the reference limit for Creatinine is approximately 13% higher for people identified as African-American. .    Creatinine, Ser  Date Value Ref Range Status  02/11/2020 0.74 0.57 - 1.00 mg/dL Final          Passed - HGB in normal range and within 360 days    Hemoglobin  Date Value Ref Range Status  02/11/2020 13.8 11.1 - 15.9 g/dL Final          Passed - Patient is not pregnant       Passed - Valid encounter within last 12 months    Recent Outpatient Visits           2 months ago Acute non-recurrent frontal sinusitis   [place], DO   3 months ago Cough   [facility], Lupita Raider, FNP   4 months ago Cough due to bronchospasm   [place], DO   4 months ago Cough due to bronchospasm   [place], FNP   4 months ago COVID-19 virus infection   [place], DO       Future Appointments             Today Kate Sable, MD [place], [facility]               ketoconazole (NIZORAL) 2 % cream [Pharmacy Med Name: Ketoconazole 2 % External Cream] 30 g 0    Sig: APPLY CREAM TOPICALLY  ONCE DAILY AS NEEDED FOR IRRITATION      Over the Counter:  OTC Passed - 03/06/2020  9:10 AM      Passed - Valid encounter within last 12 months    Recent Outpatient Visits           2 months ago Acute non-recurrent frontal sinusitis   [place], DO   3 months ago Cough   [facility], Lupita Raider, FNP   4 months ago Cough due to bronchospasm   [place], DO   4 months ago Cough due to bronchospasm   [facility], Lupita Raider, FNP   4 months ago COVID-19 virus infection   [place], Devonne Doughty, DO       Future Appointments             Today Kate Sable, MD [place], [facility]               orphenadrine (NORFLEX) 100 MG tablet [Pharmacy Med Name: Orphenadrine Citrate ER 100 MG Oral Tablet Extended Release 12 Hour] 60 tablet 0    Sig: TAKE 1 TABLET BY MOUTH TWICE DAILY AS NEEDED FOR MUSCLE SPASM      Not Delegated - Analgesics:  Muscle Relaxants Failed - 03/06/2020  9:10 AM      Failed -  This refill cannot be delegated      Passed - Valid encounter within last 6 months    Recent Outpatient Visits           2 months ago Acute non-recurrent frontal sinusitis   [place], DO   3 months ago Cough   [facility], Lupita Raider, FNP   4 months ago Cough due to bronchospasm   [place], DO   4 months ago Cough due to bronchospasm   [place], FNP   4 months ago COVID-19 virus infection   [place], Devonne Doughty, DO       Future Appointments             Today Kate Sable, MD [facility], [place]

## 2020-03-06 NOTE — Patient Instructions (Addendum)
Medication Instructions:  Your physician has recommended you make the following change in your medication:   START taking Aldactone 12.5 MG daily.  *If you need a refill on your cardiac medications before your next appointment, please call your pharmacy*   Lab Work: None ordered If you have labs (blood work) drawn today and your tests are completely normal, you will receive your results only by: Marland Kitchen MyChart Message (if you have MyChart) OR . A paper copy in the mail If you have any lab test that is abnormal or we need to change your treatment, we will call you to review the results.   Testing/Procedures: None ordered   Follow-Up: At [facility], you and your health needs are our priority.  As part of our continuing mission to provide you with exceptional heart care, we have created designated Provider Care Teams.  These Care Teams include your primary Cardiologist (physician) and Advanced Practice Providers (APPs -  Physician Assistants and Nurse Practitioners) who all work together to provide you with the care you need, when you need it.  We recommend signing up for the patient portal called "MyChart".  Sign up information is provided on this After Visit Summary.  MyChart is used to connect with patients for Virtual Visits (Telemedicine).  Patients are able to view lab/test results, encounter notes, upcoming appointments, etc.  Non-urgent messages can be sent to your provider as well.   To learn more about what you can do with MyChart, go to [email or web address].    Your next appointment:   6 week(s)  The format for your next appointment:   In Person  Provider:   Kate Sable, MD   Other Instructions

## 2020-03-06 NOTE — Progress Notes (Signed)
Cardiology Office Note:    Date:  03/06/2020   ID:  Shannon Small, DOB 04/10/1960, MRN [id number]  PCP:  Olin Hauser, DO  [place] Cardiologist:  No primary care provider on file.  [facility] Electrophysiologist:  None   Referring Shannon Small: [name] *   Chief Complaint  Patient presents with  . Other    Post L/R cath c/o chest discomfort and face numbness. Meds reviewed verbally with pt.    History of Present Illness:    Shannon Small is a 60 y.o. female with a hx of hypertension, HFrEF, obesity, GERD, chronic back pain who presents for follow-up.    Last seen due to shortness of breath.  Echocardiogram showed moderate to severely reduced ejection fraction, EF 30 to 35%.  Underwent right heart cath with nonobstructive CAD, 25% proximal RCA lesion.  Started on Toprol, lisinopril.  [name] was ordered but patient did not obtain due to cost issues.  She takes Lasix as prescribed, although she complains of urinating too much. She plans on getting back on a keto diet, to help with weight loss.   Prior notes Echo 01/2020, EF 30 to 35% Right and left heart cath 02/2020 nonobstructive CAD, proximal RCA 20% stenosed  Evaluated in the ED for dyspnea, chest CTA was negative for PE, small pleural effusions noted.  Started on diuretics with improvement in symptoms, patient stopped taking diuretics due to frequent bathroom use.   Past Medical History:  Diagnosis Date  . Acid reflux 09/09/2014  . Adjustment disorder with depressed mood 11/14/2006   ASSESSMENT: Bereavement. Recommend counseling with pastor or psychologist.   . Benign neoplasm of skin 11/14/2006  . Cephalalgia 02/10/2006  . Chronic infection of sinus 09/09/2014  . Extreme obesity 02/10/2006  . Hypertension   . LBP (low back pain) 01/15/2007   Chronic back strain and spasms due to obesity.   . Sleep related leg cramps 09/09/2014    Past Surgical History:  Procedure Laterality Date  . ABDOMINAL  HYSTERECTOMY    . RHINOPLASTY  1982  . RIGHT/LEFT HEART CATH AND CORONARY ANGIOGRAPHY N/A 02/17/2020   Procedure: RIGHT/LEFT HEART CATH AND CORONARY ANGIOGRAPHY;  Surgeon: Wellington Hampshire, Shannon Small;  Location: [facility];  Service: Cardiovascular;  Laterality: N/A;    Current Medications: Current Meds  Medication Sig  . acetaminophen (TYLENOL) 500 MG tablet Take 1,000 mg by mouth every 6 (six) hours as needed for moderate pain or headache.  . benzonatate (TESSALON) 200 MG capsule Take 1 capsule (200 mg total) by mouth 2 (two) times daily as needed for cough.  . clotrimazole-betamethasone (LOTRISONE) cream APPLY TOPICALLY TO GENITAL AREA FOR VAGINAL ITCHING AND IRRITATION FOR UP TO 2 WEEKS, THEN STOP (Patient taking differently: Apply 1 application topically daily as needed (APPLY TOPICALLY TO GENITAL AREA FOR VAGINAL ITCHING AND IRRITATION).)  . furosemide (LASIX) 40 MG tablet Take 1 tablet (40 mg total) by mouth daily.  . hydrocortisone 2.5 % cream APPLY  CREAM TO AFFECTED AREA TWICE DAILY AS NEEDED ECZEMA (Patient taking differently: Apply 1 application topically 2 (two) times daily as needed (eczema).)  . hydrOXYzine (ATARAX/VISTARIL) 25 MG tablet Take 1 tablet (25 mg total) by mouth 3 (three) times daily as needed for itching.  Marland Kitchen ibuprofen (ADVIL) 800 MG tablet Take 800 mg by mouth every 8 (eight) hours as needed.  Marland Kitchen ketoconazole (NIZORAL) 2 % cream APPLY  CREAM TOPICALLY ONCE DAILY AS NEEDED FOR IRRITATION (Patient taking differently: Apply 1 application  topically daily as needed for irritation.)  . lisinopril (ZESTRIL) 20 MG tablet Take 1 tablet by mouth once daily (Patient taking differently: Take 20 mg by mouth daily.)  . metoprolol succinate (TOPROL XL) 25 MG 24 hr tablet Take 1 tablet (25 mg total) by mouth daily.  . orphenadrine (NORFLEX) 100 MG tablet TAKE 1 TABLET BY MOUTH TWICE DAILY AS NEEDED FOR MUSCLE SPASM (Patient taking differently: Take 100 mg by mouth 2 (two) times daily  as needed for muscle spasms.)  . spironolactone (ALDACTONE) 25 MG tablet Take 0.5 tablets (12.5 mg total) by mouth daily.  Marland Kitchen triamcinolone cream (KENALOG) 0.5 % APPLY CREAM TOPICALLY TO AFFECTED AREA TWICE DAILY FOR UP TO 2 WEEKS (Patient taking differently: Apply 1 application topically 2 (two) times daily as needed (irritation).)   Current Facility-Administered Medications for the 03/06/20 encounter (Office Visit) with Shannon Sable, Shannon Small  Medication  . sodium chloride flush (NS) 0.9 % injection 3 mL     Allergies:   Patient has no known allergies.   Social History   Socioeconomic History  . Marital status: Married    Spouse name: Not on file  . Number of children: Not on file  . Years of education: Not on file  . Highest education level: Not on file  Occupational History  . Not on file  Tobacco Use  . Smoking status: Never Smoker  . Smokeless tobacco: Never Used  Vaping Use  . Vaping Use: Never used  Substance and Sexual Activity  . Alcohol use: Yes    Alcohol/week: 0.0 standard drinks    Comment: rarely  . Drug use: No  . Sexual activity: Not on file  Other Topics Concern  . Not on file  Social History Narrative  . Not on file   Social Determinants of Health   Financial Resource Strain: Not on file  Food Insecurity: Not on file  Transportation Needs: Not on file  Physical Activity: Not on file  Stress: Not on file  Social Connections: Not on file     Family History: The patient's family history includes Alcohol abuse in her brother; Breast cancer in her paternal aunt; Diabetes in her maternal grandmother and mother; Heart disease in her paternal grandmother; Kidney cancer in her father; Lung cancer in her paternal aunt; Stomach cancer in her father; Stroke in her maternal grandfather, maternal grandmother, and paternal grandfather. There is no history of Bladder Cancer or Prostate cancer.  ROS:   Please see the history of present illness.     All other  systems reviewed and are negative.  EKGs/Labs/Other Studies Reviewed:    The following studies were reviewed today:   EKG:  EKG is  ordered today.  The ekg ordered today demonstrates sinus rhythm  Recent Labs: 11/05/2019: ALT 25; B Natriuretic Peptide 214.2 02/11/2020: BUN 19; Creatinine, Ser 0.74; Hemoglobin 13.8; Platelets 228; Potassium 4.0; Sodium 137  Recent Lipid Panel    Component Value Date/Time   CHOL 202 (H) 03/05/2019 1007   TRIG 83 03/05/2019 1007   HDL 74 03/05/2019 1007   CHOLHDL 2.7 03/05/2019 1007   VLDL 17 03/28/2016 0001   LDLCALC 111 (H) 03/05/2019 1007     Risk Assessment/Calculations:      Physical Exam:    VS:  BP 140/80 (BP Location: Right Wrist, Patient Position: Sitting, Cuff Size: Large)   Pulse 78   Ht 5\' 5"  (1.651 m)   Wt (!) 330 lb 6 oz (149.9 kg)   SpO2 99%  BMI 54.98 kg/m     Wt Readings from Last 3 Encounters:  03/06/20 (!) 330 lb 6 oz (149.9 kg)  02/17/20 (!) 324 lb (147 kg)  02/11/20 (!) 324 lb (147 kg)     GEN:  Well nourished, well developed in no acute distress HEENT: Normal NECK: No JVD; No carotid bruits LYMPHATICS: No lymphadenopathy CARDIAC: RRR, no murmurs, rubs, gallops RESPIRATORY: Decreased breath sounds at bases ABDOMEN: Soft, non-tender, non-distended MUSCULOSKELETAL:  No edema; No deformity  SKIN: Warm and dry NEUROLOGIC:  Alert and oriented x 3 PSYCHIATRIC:  Normal affect   ASSESSMENT:    1. NICM (nonischemic cardiomyopathy) ([place])   2. Primary hypertension   3. Morbid obesity ([place])    PLAN:    In order of problems listed above:  1. Nonischemic cardiomyopathy, EF 30 to 35%. Left heart cath 20% RCA stenosis. Start Aldactone 12.5 mg daily. Continue lisinopril 20, Toprol-XL 25. Lasix 40 mg daily. Plan to titrate CHF meds as blood pressure permits. Due to cost issues, patient not able to afford Ferne Coe. Morbid obesity presenting obtaining CMR to evaluate infiltrative etiology. 2. Hypertension, BP  elevated, Toprol-XL, lisinopril, start Aldactone. 3. Patient is morbidly obese, weight loss advised.  Follow-up in 6 weeks for CHF medication titration.    Shared Decision Making/Informed Consent      Medication Adjustments/Labs and Tests Ordered: Current medicines are reviewed at length with the patient today.  Concerns regarding medicines are outlined above.  Orders Placed This Encounter  Procedures  . EKG 12-Lead   Meds ordered this encounter  Medications  . spironolactone (ALDACTONE) 25 MG tablet    Sig: Take 0.5 tablets (12.5 mg total) by mouth daily.    Dispense:  30 tablet    Refill:  5    Patient Instructions  Medication Instructions:  Your physician has recommended you make the following change in your medication:   START taking Aldactone 12.5 MG daily.  *If you need a refill on your cardiac medications before your next appointment, please call your pharmacy*   Lab Work: None ordered If you have labs (blood work) drawn today and your tests are completely normal, you will receive your results only by: Marland Kitchen MyChart Message (if you have MyChart) OR . A paper copy in the mail If you have any lab test that is abnormal or we need to change your treatment, we will call you to review the results.   Testing/Procedures: None ordered   Follow-Up: At [facility], you and your health needs are our priority.  As part of our continuing mission to provide you with exceptional heart care, we have created designated Provider Care Teams.  These Care Teams include your primary Cardiologist (physician) and Advanced Practice Providers (APPs -  Physician Assistants and Nurse Practitioners) who all work together to provide you with the care you need, when you need it.  We recommend signing up for the patient portal called "MyChart".  Sign up information is provided on this After Visit Summary.  MyChart is used to connect with patients for Virtual Visits (Telemedicine).  Patients are  able to view lab/test results, encounter notes, upcoming appointments, etc.  Non-urgent messages can be sent to your provider as well.   To learn more about what you can do with MyChart, go to [email or web address].    Your next appointment:   6 week(s)  The format for your next appointment:   In Person  Provider:   Kate Sable, Shannon Small   Other Instructions  Signed, Shannon Sable, Shannon Small  03/06/2020 12:30 PM    [place]

## 2020-03-16 ENCOUNTER — Other Ambulatory Visit: Payer: Self-pay

## 2020-03-16 ENCOUNTER — Ambulatory Visit (INDEPENDENT_AMBULATORY_CARE_PROVIDER_SITE_OTHER): Payer: 59 | Admitting: Family Medicine

## 2020-03-16 ENCOUNTER — Encounter: Payer: Self-pay | Admitting: Family Medicine

## 2020-03-16 VITALS — BP 156/78 | HR 86 | Temp 97.3°F | Ht 65.0 in | Wt 329.0 lb

## 2020-03-16 DIAGNOSIS — J0110 Acute frontal sinusitis, unspecified: Secondary | ICD-10-CM | POA: Diagnosis not present

## 2020-03-16 DIAGNOSIS — I10 Essential (primary) hypertension: Secondary | ICD-10-CM | POA: Diagnosis not present

## 2020-03-16 DIAGNOSIS — S90415A Abrasion, left lesser toe(s), initial encounter: Secondary | ICD-10-CM

## 2020-03-16 MED ORDER — LISINOPRIL 20 MG PO TABS: 20.0000 mg | ORAL_TABLET | Freq: Every day | ORAL | 3 refills | Status: DC

## 2020-03-16 MED ORDER — AMOXICILLIN-POT CLAVULANATE 875-125 MG PO TABS: 1.0000 | ORAL_TABLET | Freq: Two times a day (BID) | ORAL | 0 refills | Status: DC

## 2020-03-16 MED ORDER — BENZONATATE 200 MG PO CAPS
200.0000 mg | ORAL_CAPSULE | Freq: Two times a day (BID) | ORAL | 2 refills | Status: DC | PRN
Start: 2020-03-16 — End: 2020-10-09

## 2020-03-16 NOTE — Progress Notes (Signed)
Subjective:    Patient ID: Shannon Small, female    DOB: Jun 05, 1960, 60 y.o.   MRN: [id number]  Shannon Small is a 60 y.o. female presenting on 03/16/2020 for Toe Pain (Left pinky toe and it happened Thursday. Pt has been taking Tylenol.)   HPI   Left 5th Toe Injury Accidental L 5th toe injury stubbed toe and caused abrasion on tip of toe, some bleeding. She was unable to work. Out of work Sat / Sun. Due to pain and wound care on toe. Now it is improving, still difficulty with pain on weight bearing.  Sinusitis Onset 5-7 days, worsening onset now with deeper sinus pain and pressure and congestion. Denies fever or chills  Post COVID Cough - still using tessalon perls PRN needs a refill.   Depression screen [facility] 2/9 12/16/2019 03/05/2019 01/28/2019  Decreased Interest 0 0 0  Down, Depressed, Hopeless 1 0 0  PHQ - 2 Score 1 0 0  Altered sleeping 1 0 0  Tired, decreased energy 3 0 0  Change in appetite 2 0 0  Feeling bad or failure about yourself  1 0 0  Trouble concentrating 0 0 0  Moving slowly or fidgety/restless 0 0 0  Suicidal thoughts 0 0 0  PHQ-9 Score 8 0 0  Difficult doing work/chores Somewhat difficult Not difficult at all Not difficult at all    Social History   Tobacco Use   Smoking status: Never Smoker   Smokeless tobacco: Never Used  Vaping Use   Vaping Use: Never used  Substance Use Topics   Alcohol use: Yes    Alcohol/week: 0.0 standard drinks    Comment: rarely   Drug use: No    Review of Systems Per HPI unless specifically indicated above     Objective:    BP (!) 156/78 (BP Location: Right Arm, Patient Position: Sitting, Cuff Size: Normal)    Pulse 86    Temp (!) 97.3 F (36.3 C) (Temporal)    Ht 5\' 5"  (1.651 m)    Wt (!) 329 lb (149.2 kg)    SpO2 100%    BMI 54.75 kg/m   Wt Readings from Last 3 Encounters:  03/16/20 (!) 329 lb (149.2 kg)  03/06/20 (!) 330 lb 6 oz (149.9 kg)  02/17/20 (!) 324 lb (147 kg)    Physical Exam Vitals and  nursing note reviewed.  Constitutional:      General: She is not in acute distress.    Appearance: She is well-developed and well-nourished. She is not diaphoretic.     Comments: Well-appearing, comfortable, cooperative  HENT:     Head: Normocephalic and atraumatic.     Mouth/Throat:     Mouth: Oropharynx is clear and moist.  Eyes:     General:        Right eye: No discharge.        Left eye: No discharge.     Conjunctiva/sclera: Conjunctivae normal.  Cardiovascular:     Rate and Rhythm: Normal rate.  Pulmonary:     Effort: Pulmonary effort is normal.  Musculoskeletal:        General: No edema.     Comments: Left foot 5th toe with tip of toe 1 x 1 cm abrasion, not bleeding, no deeper ulceration, seems to be healing well. No extending erythema or edema, no bruising or ecchymosis, can move toe without difficulty, pain on weight bearing.  Skin:    General: Skin is warm and dry.  Findings: No erythema or rash.  Neurological:     Mental Status: She is alert and oriented to person, place, and time.  Psychiatric:        Mood and Affect: Mood and affect normal.        Behavior: Behavior normal.     Comments: Well groomed, good eye contact, normal speech and thoughts    Results for orders placed or performed during the hospital encounter of 02/13/20  SARS CORONAVIRUS 2 (TAT 6-24 HRS) Nasopharyngeal Nasopharyngeal Swab   Specimen: Nasopharyngeal Swab  Result Value Ref Range   SARS Coronavirus 2 NEGATIVE NEGATIVE      Assessment & Plan:   Problem List Items Addressed This Visit   None   Visit Diagnoses    Abrasion of fifth toe of left foot, initial encounter    -  Primary   Acute non-recurrent frontal sinusitis       Relevant Medications   amoxicillin-clavulanate (AUGMENTIN) 875-125 MG tablet   benzonatate (TESSALON) 200 MG capsule   Essential hypertension       Relevant Medications   lisinopril (ZESTRIL) 20 MG tablet      #HTN Re order Lisinopril  #Post COVID  Cough Re order Tessalon perls benzonatate PRN  #L Fifth Toe abrasion Recent accidental mechanical injury now healing Work note for missed days Sat/Sun, return Fri 3/11 Use corn pad to off load and topical antibiotic ointment OTC PRN No cellulitis or other complication Offer X-ray if not improving  #Sinusitis Clinically with sinusitis now almost 1 week Rx Augmentin antibiotic  Meds ordered this encounter  Medications   amoxicillin-clavulanate (AUGMENTIN) 875-125 MG tablet    Sig: Take 1 tablet by mouth 2 (two) times daily.    Dispense:  20 tablet    Refill:  0   benzonatate (TESSALON) 200 MG capsule    Sig: Take 1 capsule (200 mg total) by mouth 2 (two) times daily as needed for cough.    Dispense:  60 capsule    Refill:  2   lisinopril (ZESTRIL) 20 MG tablet    Sig: Take 1 tablet (20 mg total) by mouth daily.    Dispense:  90 tablet    Refill:  3      Follow up plan: Return if symptoms worsen or fail to improve.   [name], [facility] 03/16/2020, 11:17 AM

## 2020-03-16 NOTE — Patient Instructions (Addendum)
Thank you for coming to the office today.   Use a corn pad and to cushion the tip of the toe.  Refilled Tessalon perls  Trial on Augmentin antibiotic.  Please schedule a Follow-up Appointment to: Return if symptoms worsen or fail to improve.  If you have any other questions or concerns, please feel free to call the office or send a message through [facility]. You may also schedule an earlier appointment if necessary.  Additionally, you may be receiving a survey about your experience at our office within a few days to 1 week by e-mail or mail. We value your feedback.  [name], DO [place]

## 2020-04-16 ENCOUNTER — Encounter: Payer: Self-pay | Admitting: Cardiology

## 2020-04-16 ENCOUNTER — Other Ambulatory Visit: Payer: Self-pay

## 2020-04-16 ENCOUNTER — Ambulatory Visit (INDEPENDENT_AMBULATORY_CARE_PROVIDER_SITE_OTHER): Payer: 59 | Admitting: Cardiology

## 2020-04-16 VITALS — BP 150/80 | HR 75 | Ht 66.0 in | Wt 320.0 lb

## 2020-04-16 DIAGNOSIS — I2510 Atherosclerotic heart disease of native coronary artery without angina pectoris: Secondary | ICD-10-CM

## 2020-04-16 DIAGNOSIS — I10 Essential (primary) hypertension: Secondary | ICD-10-CM | POA: Diagnosis not present

## 2020-04-16 DIAGNOSIS — I428 Other cardiomyopathies: Secondary | ICD-10-CM | POA: Diagnosis not present

## 2020-04-16 MED ORDER — ATORVASTATIN CALCIUM 20 MG PO TABS: 20.0000 mg | ORAL_TABLET | Freq: Every day | ORAL | 3 refills | Status: DC

## 2020-04-16 MED ORDER — ASPIRIN EC 81 MG PO TBEC: 81.0000 mg | DELAYED_RELEASE_TABLET | Freq: Every day | ORAL | 3 refills | Status: DC

## 2020-04-16 MED ORDER — SPIRONOLACTONE 25 MG PO TABS: 25.0000 mg | ORAL_TABLET | Freq: Every day | ORAL | 3 refills | Status: DC

## 2020-04-16 MED ORDER — LISINOPRIL 40 MG PO TABS: 40.0000 mg | ORAL_TABLET | Freq: Every day | ORAL | 3 refills | Status: DC

## 2020-04-16 NOTE — Progress Notes (Signed)
Cardiology Office Note:    Date:  04/16/2020   ID:  Shannon Small, DOB 1960/12/16, MRN [id number]  PCP:  Shannon Hauser, DO  [place] Cardiologist:  No primary care provider on file.  [facility] Electrophysiologist:  None   Referring MD: Shannon Small *   Chief Complaint  Patient presents with  . OTher    6 week follow up - Patient c.o SOB but admits that she has not been taken her fluid pill like she should but to a death in the family. Patient states that after this visit she will not be able to come back due to Husband retiring. Meds reviewed verbally with patient.     History of Present Illness:    Shannon Small is a 60 y.o. female with a hx of hypertension, NICM, EF 30-35%, Obesity, GERD, chronic back pain who presents for follow-up.   Patient being seen for nonischemic cardiomyopathy.  Aldactone started after last visit.  Tolerating Toprol-XL and lisinopril.  Lack of insurance, cost issues preventing use of Entresto.  Obesity preventing obtaining MRI.  She has a lot of stressors in her family.  Husband is about to retire so she is going to lose her insurance.  She lost her brother recently.  She is not sure if she will be able to cover cost for follow-up.  States not taking Lasix, endorses more out of breath.  Prior notes Echo 01/2020, EF 30 to 35% Right and left heart cath 02/2020 nonobstructive CAD, proximal RCA 20% stenosed  Evaluated in the ED for dyspnea, chest CTA was negative for PE, small pleural effusions noted.  Started on diuretics with improvement in symptoms, patient stopped taking diuretics due to frequent bathroom use.   Past Medical History:  Diagnosis Date  . Acid reflux 09/09/2014  . Adjustment disorder with depressed mood 11/14/2006   ASSESSMENT: Bereavement. Recommend counseling with pastor or psychologist.   . Benign neoplasm of skin 11/14/2006  . Cephalalgia 02/10/2006  . Chronic infection of sinus 09/09/2014  . Extreme obesity  02/10/2006  . Hypertension   . LBP (low back pain) 01/15/2007   Chronic back strain and spasms due to obesity.   . Sleep related leg cramps 09/09/2014    Past Surgical History:  Procedure Laterality Date  . ABDOMINAL HYSTERECTOMY    . RHINOPLASTY  1982  . RIGHT/LEFT HEART CATH AND CORONARY ANGIOGRAPHY N/A 02/17/2020   Procedure: RIGHT/LEFT HEART CATH AND CORONARY ANGIOGRAPHY;  Surgeon: Wellington Hampshire, MD;  Location: [facility];  Service: Cardiovascular;  Laterality: N/A;    Current Medications: Current Meds  Medication Sig  . acetaminophen (TYLENOL) 500 MG tablet Take 1,000 mg by mouth every 6 (six) hours as needed for moderate pain or headache.  Marland Kitchen amoxicillin-clavulanate (AUGMENTIN) 875-125 MG tablet Take 1 tablet by mouth 2 (two) times daily.  Marland Kitchen aspirin EC 81 MG tablet Take 1 tablet (81 mg total) by mouth daily. Swallow whole.  Marland Kitchen atorvastatin (LIPITOR) 20 MG tablet Take 1 tablet (20 mg total) by mouth daily.  . benzonatate (TESSALON) 200 MG capsule Take 1 capsule (200 mg total) by mouth 2 (two) times daily as needed for cough.  . clotrimazole-betamethasone (LOTRISONE) cream APPLY TOPICALLY TO GENITAL AREA FOR VAGINAL ITCHING AND IRRITATION FOR UP TO 2 WEEKS, THEN STOP  . furosemide (LASIX) 40 MG tablet Take 1 tablet (40 mg total) by mouth daily.  . hydrocortisone 2.5 % cream APPLY  CREAM TO AFFECTED AREA TWICE DAILY AS NEEDED  ECZEMA  . hydrOXYzine (ATARAX/VISTARIL) 25 MG tablet Take 1 tablet (25 mg total) by mouth 3 (three) times daily as needed for itching.  Marland Kitchen ibuprofen (ADVIL) 800 MG tablet Take 1 tablet (800 mg total) by mouth every 8 (eight) hours as needed for moderate pain.  Marland Kitchen ketoconazole (NIZORAL) 2 % cream Apply 1 application topically daily as needed for irritation.  Marland Kitchen lisinopril (ZESTRIL) 40 MG tablet Take 1 tablet (40 mg total) by mouth daily.  . metoprolol succinate (TOPROL XL) 25 MG 24 hr tablet Take 1 tablet (25 mg total) by mouth daily.  . orphenadrine (NORFLEX)  100 MG tablet Take 1 tablet (100 mg total) by mouth 2 (two) times daily as needed for muscle spasms.  Marland Kitchen triamcinolone cream (KENALOG) 0.5 % APPLY CREAM TOPICALLY TO AFFECTED AREA TWICE DAILY FOR UP TO 2 WEEKS  . [DISCONTINUED] lisinopril (ZESTRIL) 20 MG tablet Take 1 tablet (20 mg total) by mouth daily.  . [DISCONTINUED] spironolactone (ALDACTONE) 25 MG tablet Take 0.5 tablets (12.5 mg total) by mouth daily.   Current Facility-Administered Medications for the 04/16/20 encounter (Office Visit) with Kate Sable, MD  Medication  . sodium chloride flush (NS) 0.9 % injection 3 mL     Allergies:   Patient has no known allergies.   Social History   Socioeconomic History  . Marital status: Married    Spouse name: Not on file  . Number of children: Not on file  . Years of education: Not on file  . Highest education level: Not on file  Occupational History  . Not on file  Tobacco Use  . Smoking status: Never Smoker  . Smokeless tobacco: Never Used  Vaping Use  . Vaping Use: Never used  Substance and Sexual Activity  . Alcohol use: Yes    Alcohol/week: 0.0 standard drinks    Comment: rarely  . Drug use: No  . Sexual activity: Not on file  Other Topics Concern  . Not on file  Social History Narrative  . Not on file   Social Determinants of Health   Financial Resource Strain: Not on file  Food Insecurity: Not on file  Transportation Needs: Not on file  Physical Activity: Not on file  Stress: Not on file  Social Connections: Not on file     Family History: The patient's family history includes Alcohol abuse in her brother; Breast cancer in her paternal aunt; Diabetes in her maternal grandmother and mother; Heart disease in her paternal grandmother; Kidney cancer in her father; Lung cancer in her paternal aunt; Stomach cancer in her father; Stroke in her maternal grandfather, maternal grandmother, and paternal grandfather. There is no history of Bladder Cancer or Prostate  cancer.  ROS:   Please see the history of present illness.     All other systems reviewed and are negative.  EKGs/Labs/Other Studies Reviewed:    The following studies were reviewed today:   EKG:  EKG not ordered today.    Recent Labs: 11/05/2019: ALT 25; B Natriuretic Peptide 214.2 02/11/2020: BUN 19; Creatinine, Ser 0.74; Hemoglobin 13.8; Platelets 228; Potassium 4.0; Sodium 137  Recent Lipid Panel    Component Value Date/Time   CHOL 202 (H) 03/05/2019 1007   TRIG 83 03/05/2019 1007   HDL 74 03/05/2019 1007   CHOLHDL 2.7 03/05/2019 1007   VLDL 17 03/28/2016 0001   LDLCALC 111 (H) 03/05/2019 1007     Risk Assessment/Calculations:      Physical Exam:    VS:  BP Marland Kitchen)  150/80 (BP Location: Right Arm, Patient Position: Sitting, Cuff Size: Large)   Pulse 75   Ht 5\' 6"  (1.676 m)   Wt (!) 320 lb (145.2 kg)   SpO2 95%   BMI 51.65 kg/m     Wt Readings from Last 3 Encounters:  04/16/20 (!) 320 lb (145.2 kg)  03/16/20 (!) 329 lb (149.2 kg)  03/06/20 (!) 330 lb 6 oz (149.9 kg)     GEN:  Well nourished, well developed in no acute distress HEENT: Normal NECK: No JVD; No carotid bruits LYMPHATICS: No lymphadenopathy CARDIAC: RRR, no murmurs, rubs, gallops RESPIRATORY: Decreased breath sounds at bases ABDOMEN: Soft, non-tender, distended MUSCULOSKELETAL: trace edema; No deformity  SKIN: Warm and dry NEUROLOGIC:  Alert and oriented x 3 PSYCHIATRIC:  Normal affect   ASSESSMENT:    1. NICM (nonischemic cardiomyopathy) ([place])   2. Primary hypertension   3. Morbid obesity ([place])   4. Coronary artery disease involving native coronary artery of native heart without angina pectoris   5. Essential hypertension    PLAN:    In order of problems listed above:  1. Nonischemic cardiomyopathy, EF 30 to 35%. Left heart cath 20% RCA stenosis.  Increase Aldactone to 25 mg daily, increase lisinopril to 40 mg daily.  Continue Toprol-XL 25 mg daily.  Lasix 40 mg daily.  We will plan to  continue titrating CHF meds as blood pressure permits. Due to cost issues, patient not able to afford [employer]. Morbid obesity presenting obtaining CMR to evaluate infiltrative etiology. 2. Hypertension, BP elevated, increase Aldactone, lisinopril as above.  Continue Toprol-XL. 3. Patient is morbidly obese, weight loss advised. 4. Nonobstructive CAD, 20% RCA lesion.  Start aspirin 81 mg, t Lipitor 20 mg daily  Follow-up in 3-6 months if able.  Patient assistance paperwork provided by office.    Shared Decision Making/Informed Consent      Medication Adjustments/Labs and Tests Ordered: Current medicines are reviewed at length with the patient today.  Concerns regarding medicines are outlined above.  No orders of the defined types were placed in this encounter.  Meds ordered this encounter  Medications  . lisinopril (ZESTRIL) 40 MG tablet    Sig: Take 1 tablet (40 mg total) by mouth daily.    Dispense:  90 tablet    Refill:  3  . spironolactone (ALDACTONE) 25 MG tablet    Sig: Take 1 tablet (25 mg total) by mouth daily.    Dispense:  90 tablet    Refill:  3  . aspirin EC 81 MG tablet    Sig: Take 1 tablet (81 mg total) by mouth daily. Swallow whole.    Dispense:  90 tablet    Refill:  3  . atorvastatin (LIPITOR) 20 MG tablet    Sig: Take 1 tablet (20 mg total) by mouth daily.    Dispense:  90 tablet    Refill:  3    Patient Instructions   Medication Instructions:   Your physician has recommended you make the following change in your medication:   1.  INCREASE your Lisinopril to 40 MG daily. 2.  INCREASE your Aldactone to 25 MG daily. 3.  START taking Aspirin 81 MG daily. 4.  START taking Lipitor 20 MG daily.   *If you need a refill on your cardiac medications before your next appointment, please call your pharmacy*   Lab Work: None ordered   Testing/Procedures: None ordered   Follow-Up: At [facility], you and your health needs are  our  priority.  As part of our continuing mission to provide you with exceptional heart care, we have created designated Provider Care Teams.  These Care Teams include your primary Cardiologist (physician) and Advanced Practice Providers (APPs -  Physician Assistants and Nurse Practitioners) who all work together to provide you with the care you need, when you need it.  We recommend signing up for the patient portal called "MyChart".  Sign up information is provided on this After Visit Summary.  MyChart is used to connect with patients for Virtual Visits (Telemedicine).  Patients are able to view lab/test results, encounter notes, upcoming appointments, etc.  Non-urgent messages can be sent to your provider as well.   To learn more about what you can do with MyChart, go to [email or web address].    Your next appointment:   6 month(s)  The format for your next appointment:   In Person  Provider:   Kate Sable, MD   Other Instructions   Heart-Healthy Eating Plan Heart-healthy meal planning includes:  Eating less unhealthy fats.  Eating more healthy fats.  Making other changes in your diet. Talk with your doctor or a diet specialist (dietitian) to create an eating plan that is right for you. What is my plan? Your doctor may recommend an eating plan that includes:  Total fat: ______% or less of total calories a day.  Saturated fat: ______% or less of total calories a day.  Cholesterol: less than _________mg a day. What are tips for following this plan? Cooking Avoid frying your food. Try to bake, boil, grill, or broil it instead. You can also reduce fat by:  Removing the skin from poultry.  Removing all visible fats from meats.  Steaming vegetables in water or broth. Meal planning  At meals, divide your plate into four equal parts: ? Fill one-half of your plate with vegetables and green salads. ? Fill one-fourth of your plate with whole grains. ? Fill one-fourth of  your plate with lean protein foods.  Eat 4-5 servings of vegetables per day. A serving of vegetables is: ? 1 cup of raw or cooked vegetables. ? 2 cups of raw leafy greens.  Eat 4-5 servings of fruit per day. A serving of fruit is: ? 1 medium whole fruit. ?  cup of dried fruit. ?  cup of fresh, frozen, or canned fruit. ?  cup of 100% fruit juice.  Eat more foods that have soluble fiber. These are apples, broccoli, carrots, beans, peas, and barley. Try to get 20-30 g of fiber per day.  Eat 4-5 servings of nuts, legumes, and seeds per week: ? 1 serving of dried beans or legumes equals  cup after being cooked. ? 1 serving of nuts is  cup. ? 1 serving of seeds equals 1 tablespoon.   General information  Eat more home-cooked food. Eat less restaurant, buffet, and fast food.  Limit or avoid alcohol.  Limit foods that are high in starch and sugar.  Avoid fried foods.  Lose weight if you are overweight.  Keep track of how much salt (sodium) you eat. This is important if you have high blood pressure. Ask your doctor to tell you more about this.  Try to add vegetarian meals each week. Fats  Choose healthy fats. These include olive oil and canola oil, flaxseeds, walnuts, almonds, and seeds.  Eat more omega-3 fats. These include salmon, mackerel, sardines, tuna, flaxseed oil, and ground flaxseeds. Try to eat fish at least 2 times each week.  Check food labels. Avoid foods with trans fats or high amounts of saturated fat.  Limit saturated fats. ? These are often found in animal products, such as meats, butter, and cream. ? These are also found in plant foods, such as palm oil, palm kernel oil, and coconut oil.  Avoid foods with partially hydrogenated oils in them. These have trans fats. Examples are stick margarine, some tub margarines, cookies, crackers, and other baked goods. What foods can I eat? Fruits All fresh, canned (in natural juice), or frozen  fruits. Vegetables Fresh or frozen vegetables (raw, steamed, roasted, or grilled). Green salads. Grains Most grains. Choose whole wheat and whole grains most of the time. Rice and pasta, including brown rice and pastas made with whole wheat. Meats and other proteins Lean, well-trimmed beef, veal, pork, and lamb. Chicken and [place] without skin. All fish and shellfish. Wild duck, rabbit, pheasant, and venison. Egg whites or low-cholesterol egg substitutes. Dried beans, peas, lentils, and tofu. Seeds and most nuts. Dairy Low-fat or nonfat cheeses, including ricotta and mozzarella. Skim or 1% milk that is liquid, powdered, or evaporated. Buttermilk that is made with low-fat milk. Nonfat or low-fat yogurt. Fats and oils Non-hydrogenated (trans-free) margarines. Vegetable oils, including soybean, sesame, sunflower, olive, peanut, safflower, corn, canola, and cottonseed. Salad dressings or mayonnaise made with a vegetable oil. Beverages Mineral water. Coffee and tea. Diet carbonated beverages. Sweets and desserts Sherbet, gelatin, and fruit ice. Small amounts of dark chocolate. Limit all sweets and desserts. Seasonings and condiments All seasonings and condiments. The items listed above may not be a complete list of foods and drinks you can eat. Contact a dietitian for more options. What foods should I avoid? Fruits Canned fruit in heavy syrup. Fruit in cream or butter sauce. Fried fruit. Limit coconut. Vegetables Vegetables cooked in cheese, cream, or butter sauce. Fried vegetables. Grains Breads that are made with saturated or trans fats, oils, or whole milk. Croissants. Sweet rolls. Donuts. High-fat crackers, such as cheese crackers. Meats and other proteins Fatty meats, such as hot dogs, ribs, sausage, bacon, rib-eye roast or steak. High-fat deli meats, such as salami and bologna. Caviar. Domestic duck and goose. Organ meats, such as liver. Dairy Cream, sour cream, cream cheese, and  creamed cottage cheese. Whole-milk cheeses. Whole or 2% milk that is liquid, evaporated, or condensed. Whole buttermilk. Cream sauce or high-fat cheese sauce. Yogurt that is made from whole milk. Fats and oils Meat fat, or shortening. Cocoa butter, hydrogenated oils, palm oil, coconut oil, palm kernel oil. Solid fats and shortenings, including bacon fat, salt pork, lard, and butter. Nondairy cream substitutes. Salad dressings with cheese or sour cream. Beverages Regular sodas and juice drinks with added sugar. Sweets and desserts Frosting. Pudding. Cookies. Cakes. Pies. Milk chocolate or white chocolate. Buttered syrups. Full-fat ice cream or ice cream drinks. The items listed above may not be a complete list of foods and drinks to avoid. Contact a dietitian for more information. Summary  Heart-healthy meal planning includes eating less unhealthy fats, eating more healthy fats, and making other changes in your diet.  Eat a balanced diet. This includes fruits and vegetables, low-fat or nonfat dairy, lean protein, nuts and legumes, whole grains, and heart-healthy oils and fats. This information is not intended to replace advice given to you by your health care provider. Make sure you discuss any questions you have with your health care provider. Document Revised: 03/02/2017 Document Reviewed: 02/03/2017 Elsevier Patient Education  2021 [employer].  Signed, Kate Sable, MD  04/16/2020 1:10 PM    [facility]

## 2020-04-16 NOTE — Patient Instructions (Signed)
Medication Instructions:   Your physician has recommended you make the following change in your medication:   1.  INCREASE your Lisinopril to 40 MG daily. 2.  INCREASE your Aldactone to 25 MG daily. 3.  START taking Aspirin 81 MG daily. 4.  START taking Lipitor 20 MG daily.   *If you need a refill on your cardiac medications before your next appointment, please call your pharmacy*   Lab Work: None ordered   Testing/Procedures: None ordered   Follow-Up: At [facility], you and your health needs are our priority.  As part of our continuing mission to provide you with exceptional heart care, we have created designated Provider Care Teams.  These Care Teams include your primary Cardiologist (physician) and Advanced Practice Providers (APPs -  Physician Assistants and Nurse Practitioners) who all work together to provide you with the care you need, when you need it.  We recommend signing up for the patient portal called "MyChart".  Sign up information is provided on this After Visit Summary.  MyChart is used to connect with patients for Virtual Visits (Telemedicine).  Patients are able to view lab/test results, encounter notes, upcoming appointments, etc.  Non-urgent messages can be sent to your provider as well.   To learn more about what you can do with MyChart, go to [email or web address].    Your next appointment:   6 month(s)  The format for your next appointment:   In Person  Provider:   Kate Sable, MD   Other Instructions   Heart-Healthy Eating Plan Heart-healthy meal planning includes:  Eating less unhealthy fats.  Eating more healthy fats.  Making other changes in your diet. Talk with your doctor or a diet specialist (dietitian) to create an eating plan that is right for you. What is my plan? Your doctor may recommend an eating plan that includes:  Total fat: ______% or less of total calories a day.  Saturated fat: ______% or less of total  calories a day.  Cholesterol: less than _________mg a day. What are tips for following this plan? Cooking Avoid frying your food. Try to bake, boil, grill, or broil it instead. You can also reduce fat by:  Removing the skin from poultry.  Removing all visible fats from meats.  Steaming vegetables in water or broth. Meal planning  At meals, divide your plate into four equal parts: ? Fill one-half of your plate with vegetables and green salads. ? Fill one-fourth of your plate with whole grains. ? Fill one-fourth of your plate with lean protein foods.  Eat 4-5 servings of vegetables per day. A serving of vegetables is: ? 1 cup of raw or cooked vegetables. ? 2 cups of raw leafy greens.  Eat 4-5 servings of fruit per day. A serving of fruit is: ? 1 medium whole fruit. ?  cup of dried fruit. ?  cup of fresh, frozen, or canned fruit. ?  cup of 100% fruit juice.  Eat more foods that have soluble fiber. These are apples, broccoli, carrots, beans, peas, and barley. Try to get 20-30 g of fiber per day.  Eat 4-5 servings of nuts, legumes, and seeds per week: ? 1 serving of dried beans or legumes equals  cup after being cooked. ? 1 serving of nuts is  cup. ? 1 serving of seeds equals 1 tablespoon.   General information  Eat more home-cooked food. Eat less restaurant, buffet, and fast food.  Limit or avoid alcohol.  Limit foods that are high in starch  and sugar.  Avoid fried foods.  Lose weight if you are overweight.  Keep track of how much salt (sodium) you eat. This is important if you have high blood pressure. Ask your doctor to tell you more about this.  Try to add vegetarian meals each week. Fats  Choose healthy fats. These include olive oil and canola oil, flaxseeds, walnuts, almonds, and seeds.  Eat more omega-3 fats. These include salmon, mackerel, sardines, tuna, flaxseed oil, and ground flaxseeds. Try to eat fish at least 2 times each week.  Check food labels.  Avoid foods with trans fats or high amounts of saturated fat.  Limit saturated fats. ? These are often found in animal products, such as meats, butter, and cream. ? These are also found in plant foods, such as palm oil, palm kernel oil, and coconut oil.  Avoid foods with partially hydrogenated oils in them. These have trans fats. Examples are stick margarine, some tub margarines, cookies, crackers, and other baked goods. What foods can I eat? Fruits All fresh, canned (in natural juice), or frozen fruits. Vegetables Fresh or frozen vegetables (raw, steamed, roasted, or grilled). Green salads. Grains Most grains. Choose whole wheat and whole grains most of the time. Rice and pasta, including brown rice and pastas made with whole wheat. Meats and other proteins Lean, well-trimmed beef, veal, pork, and lamb. Chicken and [place] without skin. All fish and shellfish. Wild duck, rabbit, pheasant, and venison. Egg whites or low-cholesterol egg substitutes. Dried beans, peas, lentils, and tofu. Seeds and most nuts. Dairy Low-fat or nonfat cheeses, including ricotta and mozzarella. Skim or 1% milk that is liquid, powdered, or evaporated. Buttermilk that is made with low-fat milk. Nonfat or low-fat yogurt. Fats and oils Non-hydrogenated (trans-free) margarines. Vegetable oils, including soybean, sesame, sunflower, olive, peanut, safflower, corn, canola, and cottonseed. Salad dressings or mayonnaise made with a vegetable oil. Beverages Mineral water. Coffee and tea. Diet carbonated beverages. Sweets and desserts Sherbet, gelatin, and fruit ice. Small amounts of dark chocolate. Limit all sweets and desserts. Seasonings and condiments All seasonings and condiments. The items listed above may not be a complete list of foods and drinks you can eat. Contact a dietitian for more options. What foods should I avoid? Fruits Canned fruit in heavy syrup. Fruit in cream or butter sauce. Fried fruit. Limit  coconut. Vegetables Vegetables cooked in cheese, cream, or butter sauce. Fried vegetables. Grains Breads that are made with saturated or trans fats, oils, or whole milk. Croissants. Sweet rolls. Donuts. High-fat crackers, such as cheese crackers. Meats and other proteins Fatty meats, such as hot dogs, ribs, sausage, bacon, rib-eye roast or steak. High-fat deli meats, such as salami and bologna. Caviar. Domestic duck and goose. Organ meats, such as liver. Dairy Cream, sour cream, cream cheese, and creamed cottage cheese. Whole-milk cheeses. Whole or 2% milk that is liquid, evaporated, or condensed. Whole buttermilk. Cream sauce or high-fat cheese sauce. Yogurt that is made from whole milk. Fats and oils Meat fat, or shortening. Cocoa butter, hydrogenated oils, palm oil, coconut oil, palm kernel oil. Solid fats and shortenings, including bacon fat, salt pork, lard, and butter. Nondairy cream substitutes. Salad dressings with cheese or sour cream. Beverages Regular sodas and juice drinks with added sugar. Sweets and desserts Frosting. Pudding. Cookies. Cakes. Pies. Milk chocolate or white chocolate. Buttered syrups. Full-fat ice cream or ice cream drinks. The items listed above may not be a complete list of foods and drinks to avoid. Contact a dietitian for more  information. Summary  Heart-healthy meal planning includes eating less unhealthy fats, eating more healthy fats, and making other changes in your diet.  Eat a balanced diet. This includes fruits and vegetables, low-fat or nonfat dairy, lean protein, nuts and legumes, whole grains, and heart-healthy oils and fats. This information is not intended to replace advice given to you by your health care provider. Make sure you discuss any questions you have with your health care provider. Document Revised: 03/02/2017 Document Reviewed: 02/03/2017 Elsevier Patient Education  2021 [employer].

## 2020-04-20 ENCOUNTER — Telehealth: Payer: Self-pay | Admitting: Cardiology

## 2020-04-20 MED ORDER — EZETIMIBE 10 MG PO TABS: 10.0000 mg | ORAL_TABLET | Freq: Every day | ORAL | 3 refills | Status: DC

## 2020-04-20 NOTE — Telephone Encounter (Signed)
Called patient and gave her the response from Dr. Garen Lah as seen below:  Okay to stop Lipitor due to myalgias. Start Zetia 10 mg daily instead to help control cholesterol. Thank you   Patient verbalized understanding and agreed with plan.

## 2020-04-20 NOTE — Telephone Encounter (Signed)
Pt c/o medication issue:  1. Name of Medication: lipitor   2. How are you currently taking this medication (dosage and times per day)? 20 mg po q d   3. Are you having a reaction (difficulty breathing--STAT)? Muscle pain   4. What is your medication issue? Unable to tolerate

## 2020-08-27 ENCOUNTER — Other Ambulatory Visit: Payer: Self-pay

## 2020-08-27 MED ORDER — METOPROLOL SUCCINATE ER 25 MG PO TB24: 25.0000 mg | ORAL_TABLET | Freq: Every day | ORAL | 2 refills | Status: DC

## 2020-10-09 ENCOUNTER — Telehealth (INDEPENDENT_AMBULATORY_CARE_PROVIDER_SITE_OTHER): Payer: Self-pay | Admitting: Internal Medicine

## 2020-10-09 ENCOUNTER — Other Ambulatory Visit: Payer: Self-pay

## 2020-10-09 ENCOUNTER — Encounter: Payer: Self-pay | Admitting: Internal Medicine

## 2020-10-09 DIAGNOSIS — J0110 Acute frontal sinusitis, unspecified: Secondary | ICD-10-CM

## 2020-10-09 MED ORDER — PREDNISONE 10 MG PO TABS: ORAL_TABLET | ORAL | 0 refills | Status: DC

## 2020-10-09 MED ORDER — AMOXICILLIN-POT CLAVULANATE 875-125 MG PO TABS: 1.0000 | ORAL_TABLET | Freq: Two times a day (BID) | ORAL | 0 refills | Status: DC

## 2020-10-09 MED ORDER — BENZONATATE 200 MG PO CAPS: 200.0000 mg | ORAL_CAPSULE | Freq: Two times a day (BID) | ORAL | 0 refills | Status: DC | PRN

## 2020-10-09 NOTE — Patient Instructions (Signed)

## 2020-10-09 NOTE — Progress Notes (Signed)
Virtual Visit via Video Note  I connected with Shannon Small on 10/09/20 at  9:40 AM EDT by a video enabled telemedicine application and verified that I am speaking with the correct person using two identifiers.  Location: Patient: Home Provider: Office   Persons participating in this video call: Webb Silversmith, NP and Ozzie Hoyle  I discussed the limitations of evaluation and management by telemedicine and the availability of in person appointments. The patient expressed understanding and agreed to proceed.  History of Present Illness:  Patient reports ear pain, nasal congestion, sore throat and cough.  She reports this started 2 days ago.  She describes the ear pain as.  The ear pain is worse on the right.  She is not blowing anything out of her nose.  She is having some difficulty swallowing.  The cough is productive of brown mucus.  She denies headache, eye pain, eye redness, eye discharge, runny nose, shortness of breath, nausea, vomiting or diarrhea.  She denies fever, chills or body aches.  She had a negative home COVID test last night.  She has not had COVID exposure that she is aware of.  She has had 2 COVID vaccines.  She has a history of allergies and a chronic cough.    Past Medical History:  Diagnosis Date   Acid reflux 09/09/2014   Adjustment disorder with depressed mood 11/14/2006   ASSESSMENT: Bereavement. Recommend counseling with pastor or psychologist.    Benign neoplasm of skin 11/14/2006   Cephalalgia 02/10/2006   Chronic infection of sinus 09/09/2014   Extreme obesity 02/10/2006   Hypertension    LBP (low back pain) 01/15/2007   Chronic back strain and spasms due to obesity.    Sleep related leg cramps 09/09/2014    Current Outpatient Medications  Medication Sig Dispense Refill   amoxicillin-clavulanate (AUGMENTIN) 875-125 MG tablet Take 1 tablet by mouth 2 (two) times daily. 20 tablet 0   aspirin EC 81 MG tablet Take 1 tablet (81 mg total) by mouth daily. Swallow whole.  90 tablet 3   clotrimazole-betamethasone (LOTRISONE) cream APPLY TOPICALLY TO GENITAL AREA FOR VAGINAL ITCHING AND IRRITATION FOR UP TO 2 WEEKS, THEN STOP 45 g 3   furosemide (LASIX) 40 MG tablet Take 1 tablet (40 mg total) by mouth daily. 30 tablet 3   hydrocortisone 2.5 % cream APPLY  CREAM TO AFFECTED AREA TWICE DAILY AS NEEDED ECZEMA 28 g 2   hydrOXYzine (ATARAX/VISTARIL) 25 MG tablet Take 1 tablet (25 mg total) by mouth 3 (three) times daily as needed for itching. 60 tablet 2   ibuprofen (ADVIL) 800 MG tablet Take 1 tablet (800 mg total) by mouth every 8 (eight) hours as needed for moderate pain. 90 tablet 2   ketoconazole (NIZORAL) 2 % cream Apply 1 application topically daily as needed for irritation. 30 g 2   metoprolol succinate (TOPROL XL) 25 MG 24 hr tablet Take 1 tablet (25 mg total) by mouth daily. 30 tablet 2   orphenadrine (NORFLEX) 100 MG tablet Take 1 tablet (100 mg total) by mouth 2 (two) times daily as needed for muscle spasms. 60 tablet 2   triamcinolone cream (KENALOG) 0.5 % APPLY CREAM TOPICALLY TO AFFECTED AREA TWICE DAILY FOR UP TO 2 WEEKS 30 g 2   acetaminophen (TYLENOL) 500 MG tablet Take 1,000 mg by mouth every 6 (six) hours as needed for moderate pain or headache. (Patient not taking: Reported on 10/09/2020)     benzonatate (TESSALON) 200 MG capsule  Take 1 capsule (200 mg total) by mouth 2 (two) times daily as needed for cough. (Patient not taking: Reported on 10/09/2020) 60 capsule 2   ezetimibe (ZETIA) 10 MG tablet Take 1 tablet (10 mg total) by mouth daily. 30 tablet 3   lisinopril (ZESTRIL) 40 MG tablet Take 1 tablet (40 mg total) by mouth daily. 90 tablet 3   spironolactone (ALDACTONE) 25 MG tablet Take 1 tablet (25 mg total) by mouth daily. 90 tablet 3   Current Facility-Administered Medications  Medication Dose Route Frequency Provider Last Rate Last Admin   sodium chloride flush (NS) 0.9 % injection 3 mL  3 mL Intravenous Q12H Kate Sable, MD         Allergies  Allergen Reactions   Atorvastatin Other (See Comments)    Myalgias    Family History  Problem Relation Age of Onset   Stomach cancer Father    Kidney cancer Father    Diabetes Maternal Grandmother    Stroke Maternal Grandmother    Stroke Maternal Grandfather    Heart disease Paternal Grandmother    Stroke Paternal Grandfather    Diabetes Mother    Alcohol abuse Brother    Breast cancer Paternal Aunt    Lung cancer Paternal Aunt    Bladder Cancer Neg Hx    Prostate cancer Neg Hx     Social History   Socioeconomic History   Marital status: Married    Spouse name: Not on file   Number of children: Not on file   Years of education: Not on file   Highest education level: Not on file  Occupational History   Not on file  Tobacco Use   Smoking status: Never   Smokeless tobacco: Never  Vaping Use   Vaping Use: Never used  Substance and Sexual Activity   Alcohol use: Yes    Alcohol/week: 0.0 standard drinks    Comment: rarely   Drug use: No   Sexual activity: Not on file  Other Topics Concern   Not on file  Social History Narrative   Not on file   Social Determinants of Health   Financial Resource Strain: Not on file  Food Insecurity: Not on file  Transportation Needs: Not on file  Physical Activity: Not on file  Stress: Not on file  Social Connections: Not on file  Intimate Partner Violence: Not on file     Constitutional: Denies fever, malaise, fatigue, headache or abrupt weight changes.  HEENT: Patient reports ear pain, nasal congestion and sore throat.  Denies eye redness, ear pain, ringing in the ears, wax buildup, runny nose, bloody nose. Respiratory: Patient reports cough.  Denies difficulty breathing, shortness of breath.   Cardiovascular: Denies chest pain, chest tightness, palpitations or swelling in the hands or feet.  Gastrointestinal: Denies abdominal pain, bloating, constipation, diarrhea or blood in the stool.   No other  specific complaints in a complete review of systems (except as listed in HPI above).  Observations/Objective:  Wt Readings from Last 3 Encounters:  04/16/20 (!) 320 lb (145.2 kg)  03/16/20 (!) 329 lb (149.2 kg)  03/06/20 (!) 330 lb 6 oz (149.9 kg)    General: Appears her stated age, obese, in NAD. HEENT: Head: normal shape and size; Nose: nasal congestion noted; Throat/Mouth: hoarseness noted.  Pulmonary/Chest: Normal effort. No respiratory distress.  Neurological: Alert and oriented.    BMET    Component Value Date/Time   NA 137 02/11/2020 1055   NA 140 07/30/2013 0847  K 4.0 02/11/2020 1055   K 3.7 07/30/2013 0847   CL 102 02/11/2020 1055   CL 107 07/30/2013 0847   CO2 22 02/11/2020 1055   CO2 27 07/30/2013 0847   GLUCOSE 96 02/11/2020 1055   GLUCOSE 156 (H) 11/05/2019 0559   GLUCOSE 93 07/30/2013 0847   BUN 19 02/11/2020 1055   BUN 17 07/30/2013 0847   CREATININE 0.74 02/11/2020 1055   CREATININE 0.63 03/05/2019 1007   CALCIUM 9.4 02/11/2020 1055   CALCIUM 8.5 07/30/2013 0847   GFRNONAA 89 02/11/2020 1055   GFRNONAA >60 11/05/2019 0559   GFRNONAA 99 03/05/2019 1007   GFRAA 103 02/11/2020 1055   GFRAA 115 03/05/2019 1007    Lipid Panel     Component Value Date/Time   CHOL 202 (H) 03/05/2019 1007   TRIG 83 03/05/2019 1007   HDL 74 03/05/2019 1007   CHOLHDL 2.7 03/05/2019 1007   VLDL 17 03/28/2016 0001   LDLCALC 111 (H) 03/05/2019 1007    CBC    Component Value Date/Time   WBC 9.6 02/11/2020 1055   WBC 11.4 (H) 11/05/2019 0559   RBC 4.88 02/11/2020 1055   RBC 4.73 11/05/2019 0559   HGB 13.8 02/11/2020 1055   HCT 41.6 02/11/2020 1055   PLT 228 02/11/2020 1055   MCV 85 02/11/2020 1055   MCV 85 07/30/2013 0847   MCH 28.3 02/11/2020 1055   MCH 27.5 11/05/2019 0559   MCHC 33.2 02/11/2020 1055   MCHC 31.9 11/05/2019 0559   RDW 12.7 02/11/2020 1055   RDW 13.4 07/30/2013 0847   LYMPHSABS 1.2 11/05/2019 0559   MONOABS 0.1 11/05/2019 0559   EOSABS 0.0  11/05/2019 0559   BASOSABS 0.0 11/05/2019 0559    Hgb A1C Lab Results  Component Value Date   HGBA1C 5.3 03/05/2019        Assessment and Plan:  Viral Sinusitis:  She has already been taking Augmentin, insistent that she needs antibiotics Augmentin refilled today RX for Pred Taper x 6 days Tessalon Pearls refilled today  Return precautions discussed  Follow Up Instructions:    I discussed the assessment and treatment plan with the patient. The patient was provided an opportunity to ask questions and all were answered. The patient agreed with the plan and demonstrated an understanding of the instructions.   The patient was advised to call back or seek an in-person evaluation if the symptoms worsen or if the condition fails to improve as anticipated.    Webb Silversmith, NP

## 2020-10-21 ENCOUNTER — Telehealth: Payer: Self-pay | Admitting: Cardiology

## 2020-10-21 NOTE — Telephone Encounter (Signed)
Called patient to schedule recall Patient states she does not have insurance and is doing fine No longer wishes to follow up  Recall deleted

## 2020-11-21 ENCOUNTER — Other Ambulatory Visit: Payer: Self-pay | Admitting: Family Medicine

## 2020-11-21 DIAGNOSIS — S39012A Strain of muscle, fascia and tendon of lower back, initial encounter: Secondary | ICD-10-CM

## 2020-11-21 DIAGNOSIS — M62830 Muscle spasm of back: Secondary | ICD-10-CM

## 2020-11-21 NOTE — Telephone Encounter (Signed)
Requested medication (s) are due for refill today: yes  Requested medication (s) are on the active medication list: yes  Last refill:  03/10/2020  Future visit scheduled: no  Notes to clinic:  Unable to refill per protocol, cannot delegate.      Requested Prescriptions  Pending Prescriptions Disp Refills   orphenadrine (NORFLEX) 100 MG tablet [Pharmacy Med Name: Orphenadrine Citrate ER 100 MG Oral Tablet Extended Release 12 Hour] 60 tablet 0    Sig: TAKE 1 TABLET BY MOUTH TWICE DAILY AS NEEDED FOR MUSCLE SPASM     Not Delegated - Analgesics:  Muscle Relaxants Failed - 11/21/2020 12:20 PM      Failed - This refill cannot be delegated      Passed - Valid encounter within last 6 months    Recent Outpatient Visits           1 month ago Acute non-recurrent frontal sinusitis   [facility] [place], Coralie Keens, NP   8 months ago Abrasion of fifth toe of left foot, initial encounter   [facility] Olin Hauser, DO   11 months ago Acute non-recurrent frontal sinusitis   [facility] [place], Devonne Doughty, DO   12 months ago Cough   [facility], [place], FNP   1 year ago Cough due to bronchospasm   [place], Devonne Doughty, DO              Signed Prescriptions Disp Refills   ibuprofen (ADVIL) 800 MG tablet 90 tablet 2    Sig: TAKE 1 TABLET BY MOUTH EVERY 8 HOURS AS NEEDED FOR MODERATE PAIN     Analgesics:  NSAIDS Passed - 11/21/2020 12:20 PM      Passed - Cr in normal range and within 360 days    Creat  Date Value Ref Range Status  03/05/2019 0.63 0.50 - 1.05 mg/dL Final    Comment:    For patients >50 years of age, the reference limit for Creatinine is approximately 13% higher for people identified as African-American. .    Creatinine, Ser  Date Value Ref Range Status  02/11/2020 0.74 0.57 - 1.00 mg/dL Final          Passed - HGB in normal range and within  360 days    Hemoglobin  Date Value Ref Range Status  02/11/2020 13.8 11.1 - 15.9 g/dL Final          Passed - Patient is not pregnant      Passed - Valid encounter within last 12 months    Recent Outpatient Visits           1 month ago Acute non-recurrent frontal sinusitis   [facility] [place], Coralie Keens, NP   8 months ago Abrasion of fifth toe of left foot, initial encounter   [place], DO   11 months ago Acute non-recurrent frontal sinusitis   [facility] Olin Hauser, DO   12 months ago Cough   [facility], Lupita Raider, FNP   1 year ago Cough due to bronchospasm   [employer], DO

## 2020-11-21 NOTE — Telephone Encounter (Signed)
Requested Prescriptions  Pending Prescriptions Disp Refills  . orphenadrine (NORFLEX) 100 MG tablet [Pharmacy Med Name: Orphenadrine Citrate ER 100 MG Oral Tablet Extended Release 12 Hour] 60 tablet 0    Sig: TAKE 1 TABLET BY MOUTH TWICE DAILY AS NEEDED FOR MUSCLE SPASM     Not Delegated - Analgesics:  Muscle Relaxants Failed - 11/21/2020 12:20 PM      Failed - This refill cannot be delegated      Passed - Valid encounter within last 6 months    Recent Outpatient Visits          1 month ago Acute non-recurrent frontal sinusitis   [facility] [place], Coralie Keens, NP   8 months ago Abrasion of fifth toe of left foot, initial encounter   [facility] Olin Hauser, DO   11 months ago Acute non-recurrent frontal sinusitis   [facility] Olin Hauser, DO   12 months ago Cough   [facility], [place], FNP   1 year ago Cough due to bronchospasm   [facility], Devonne Doughty, DO             . ibuprofen (ADVIL) 800 MG tablet [Pharmacy Med Name: Ibuprofen 800 MG Oral Tablet] 90 tablet 0    Sig: TAKE 1 TABLET BY MOUTH EVERY 8 HOURS AS NEEDED FOR MODERATE PAIN     Analgesics:  NSAIDS Passed - 11/21/2020 12:20 PM      Passed - Cr in normal range and within 360 days    Creat  Date Value Ref Range Status  03/05/2019 0.63 0.50 - 1.05 mg/dL Final    Comment:    For patients >79 years of age, the reference limit for Creatinine is approximately 13% higher for people identified as African-American. .    Creatinine, Ser  Date Value Ref Range Status  02/11/2020 0.74 0.57 - 1.00 mg/dL Final         Passed - HGB in normal range and within 360 days    Hemoglobin  Date Value Ref Range Status  02/11/2020 13.8 11.1 - 15.9 g/dL Final         Passed - Patient is not pregnant      Passed - Valid encounter within last 12 months    Recent Outpatient Visits          1 month ago  Acute non-recurrent frontal sinusitis   [facility] [place], Coralie Keens, NP   8 months ago Abrasion of fifth toe of left foot, initial encounter   [place], DO   11 months ago Acute non-recurrent frontal sinusitis   [facility] Olin Hauser, DO   12 months ago Cough   [facility], Lupita Raider, FNP   1 year ago Cough due to bronchospasm   [place], DO

## 2020-11-27 ENCOUNTER — Telehealth: Payer: Self-pay | Admitting: Cardiology

## 2020-11-27 MED ORDER — METOPROLOL SUCCINATE ER 25 MG PO TB24: 25.0000 mg | ORAL_TABLET | Freq: Every day | ORAL | 0 refills | Status: DC

## 2020-11-27 NOTE — Telephone Encounter (Signed)
Requested Prescriptions   Signed Prescriptions Disp Refills   metoprolol succinate (TOPROL XL) 25 MG 24 hr tablet 90 tablet 0    Sig: Take 1 tablet (25 mg total) by mouth daily.    Authorizing Provider: Kate Sable    Ordering User: Britt Bottom

## 2020-11-27 NOTE — Telephone Encounter (Signed)
*  STAT* If patient is at the pharmacy, call can be transferred to refill team.   1. Which medications need to be refilled? (please list name of each medication and dose if known)   Metoprolol   2. Which pharmacy/location (including street and city if local pharmacy) is medication to be sent to? [facility] [address]   3. Do they need a 30 day or 90 day supply? 90   Patient lost insurance and is unable to afford a visit at this time but really needs bp medication .

## 2020-12-09 ENCOUNTER — Ambulatory Visit: Payer: Self-pay | Admitting: *Deleted

## 2020-12-09 NOTE — Telephone Encounter (Signed)
Patient called in to say that since yesterday she have gotten worst with productive cough of brown mucus, sore throat and nasal congestion. No appointment with Dr Raliegh Ip available and patient does not have insurance but concerned and need some direction please call Ph# [phone]   Called patient to review sx. C/o worsening symptoms of productive cough , brown mucus, sore throat , nasal congestion , right earache. Sinus pressure and fullness since Monday 12/07/20. Patient has been taking tylenol , mucinex, tessalon . C/o she feels she has a fever but has no thermometer to check temp. Tested negative for covid 12/08/20. No available appt VV until 12/14/20. No appt scheduled. Recommended E visit or UC / ED. No insurance and patient requesting PCP to please give medication to treat sx. Care advise given. Patient verbalized understanding of care advise and to call back or go to [facility] or ED if symtpoms worsen.

## 2020-12-09 NOTE — Telephone Encounter (Signed)
Reason for Disposition  Lots of coughing  Answer Assessment - Initial Assessment Questions 1. LOCATION: "Where does it hurt?"      Upper respiratory , sore throat  2. ONSET: "When did the sinus pain start?"  (e.g., hours, days)      Monday 12/07/20 3. SEVERITY: "How bad is the pain?"   (Scale 1-10; mild, moderate or severe)   - MILD (1-3): doesn't interfere with normal activities    - MODERATE (4-7): interferes with normal activities (e.g., work or school) or awakens from sleep   - SEVERE (8-10): excruciating pain and patient unable to do any normal activities        Not sleeping good  4. RECURRENT SYMPTOM: "Have you ever had sinus problems before?" If Yes, ask: "When was the last time?" and "What happened that time?"      Na  5. NASAL CONGESTION: "Is the nose blocked?" If Yes, ask: "Can you open it or must you breathe through your mouth?"     Na  6. NASAL DISCHARGE: "Do you have discharge from your nose?" If so ask, "What color?"     Mostly coughing up brown mucus  7. FEVER: "Do you have a fever?" If Yes, ask: "What is it, how was it measured, and when did it start?"      Yes , did not check temp no thermometer. Reports she feels like she has fever 8. OTHER SYMPTOMS: "Do you have any other symptoms?" (e.g., sore throat, cough, earache, difficulty breathing)     Sore throat , right earache, cough up brown mucus 9. PREGNANCY: "Is there any chance you are pregnant?" "When was your last menstrual period?"     na  Protocols used: Sinus Pain or Congestion-A-AH

## 2020-12-09 NOTE — Telephone Encounter (Signed)
I called the patient and offered her an appt for Monday and she declined. No appt available sooner. I recommended the patient to do a virtual Evisit or be seen at Urgent Care.

## 2020-12-11 ENCOUNTER — Ambulatory Visit: Payer: 59 | Admitting: Cardiology

## 2020-12-14 ENCOUNTER — Emergency Department
Admission: EM | Admit: 2020-12-14 | Discharge: 2020-12-14 | Disposition: A | Discharge status: Home / Self Care | Payer: Self-pay | Attending: Emergency Medicine | Admitting: Emergency Medicine

## 2020-12-14 ENCOUNTER — Other Ambulatory Visit: Payer: Self-pay

## 2020-12-14 ENCOUNTER — Emergency Department: Payer: Self-pay

## 2020-12-14 DIAGNOSIS — I10 Essential (primary) hypertension: Secondary | ICD-10-CM | POA: Insufficient documentation

## 2020-12-14 DIAGNOSIS — Z8616 Personal history of COVID-19: Secondary | ICD-10-CM | POA: Insufficient documentation

## 2020-12-14 DIAGNOSIS — Z79899 Other long term (current) drug therapy: Secondary | ICD-10-CM | POA: Insufficient documentation

## 2020-12-14 DIAGNOSIS — U071 COVID-19: Secondary | ICD-10-CM | POA: Insufficient documentation

## 2020-12-14 LAB — TROPONIN I (HIGH SENSITIVITY): Troponin I (High Sensitivity): 5 ng/L (ref <18)

## 2020-12-14 LAB — CBC
HCT: 37.6 % (ref 36.0–46.0)
Hemoglobin: 12.1 g/dL (ref 12.0–15.0)
MCH: 29.1 pg (ref 26.0–34.0)
MCHC: 32.2 g/dL (ref 30.0–36.0)
MCV: 90.4 fL (ref 80.0–100.0)
Platelets: 245 10*3/uL (ref 150–400)
RBC: 4.16 MIL/uL (ref 3.87–5.11)
RDW: 13.2 % (ref 11.5–15.5)
WBC: 8.4 10*3/uL (ref 4.0–10.5)
nRBC: 0 % (ref 0.0–0.2)

## 2020-12-14 LAB — COMPREHENSIVE METABOLIC PANEL
ALT: 18 U/L (ref 0–44)
AST: 18 U/L (ref 15–41)
Albumin: 3.7 g/dL (ref 3.5–5.0)
Alkaline Phosphatase: 68 U/L (ref 38–126)
Anion gap: 4 — ABNORMAL LOW (ref 5–15)
BUN: 17 mg/dL (ref 6–20)
CO2: 25 mmol/L (ref 22–32)
Calcium: 8.9 mg/dL (ref 8.9–10.3)
Chloride: 108 mmol/L (ref 98–111)
Creatinine, Ser: 0.66 mg/dL (ref 0.44–1.00)
GFR, Estimated: >60 mL/min (ref >60)
Glucose, Bld: 98 mg/dL (ref 70–99)
Potassium: 4.2 mmol/L (ref 3.5–5.1)
Sodium: 137 mmol/L (ref 135–145)
Total Bilirubin: 0.6 mg/dL (ref 0.3–1.2)
Total Protein: 7.3 g/dL (ref 6.5–8.1)

## 2020-12-14 LAB — RESP PANEL BY RT-PCR (FLU A&B, COVID) ARPGX2
Influenza A by PCR: NEGATIVE
Influenza B by PCR: NEGATIVE
SARS Coronavirus 2 by RT PCR: POSITIVE — AB

## 2020-12-14 MED ORDER — PREDNISONE 50 MG PO TABS: 50.0000 mg | ORAL_TABLET | Freq: Every day | ORAL | 0 refills | Status: DC

## 2020-12-14 NOTE — ED Triage Notes (Signed)
Pt in with co chest pain and shob for few days. States has had cough and congestion states worse when she coughs and takes a deep breath.

## 2020-12-14 NOTE — ED Provider Notes (Signed)
[facility] Emergency Department Provider Note   ____________________________________________    I have reviewed the triage vital signs and the nursing notes.   HISTORY  Chief Complaint Chest Pain     HPI Shannon Small is a 60 y.o. female who presents with complaints of chest discomfort, fatigue, congestion, body aches, headaches, chills.  Symptoms have been ongoing for 3 to 4 days.  She denies nausea or vomiting or abdominal pain.  No shortness of breath reported.  Occasional cough.  Has not take anything for this  Past Medical History:  Diagnosis Date   Acid reflux 09/09/2014   Adjustment disorder with depressed mood 11/14/2006   ASSESSMENT: Bereavement. Recommend counseling with pastor or psychologist.    Benign neoplasm of skin 11/14/2006   Cephalalgia 02/10/2006   Chronic infection of sinus 09/09/2014   Extreme obesity 02/10/2006   Hypertension    LBP (low back pain) 01/15/2007   Chronic back strain and spasms due to obesity.    Sleep related leg cramps 09/09/2014    Patient Active Problem List   Diagnosis Date Noted   HFrEF (heart failure with reduced ejection fraction) (HCC)    Cough 11/26/2019   Cough due to bronchospasm 10/25/2019   Muscle spasm of back 10/25/2019   COVID-19 virus infection 10/25/2019   Pure hypercholesterolemia 05/30/2019   Lymphedema 09/03/2018   Chronic venous insufficiency 09/03/2018   Morbid obesity ([place]) 09/26/2017   Osteoarthritis of knee 09/26/2017   Right knee pain 09/21/2017   Chronic cough 03/02/2016   Allergic rhinitis due to allergen 03/02/2016   Pre-diabetes 03/01/2016   Urinary incontinence, mixed 03/01/2016   Hypertension 09/09/2014   Acid reflux 09/09/2014   Sleep related leg cramps 09/09/2014   Low back pain 01/15/2007   Benign neoplasm of skin 11/14/2006   Cephalalgia 02/10/2006   Morbid obesity with BMI of 50.0-59.9, adult ([place]) 02/10/2006    Past Surgical History:  Procedure Laterality Date    ABDOMINAL HYSTERECTOMY     RHINOPLASTY  1982   RIGHT/LEFT HEART CATH AND CORONARY ANGIOGRAPHY N/A 02/17/2020   Procedure: RIGHT/LEFT HEART CATH AND CORONARY ANGIOGRAPHY;  Surgeon: Wellington Hampshire, MD;  Location: [facility];  Service: Cardiovascular;  Laterality: N/A;    Prior to Admission medications   Medication Sig Start Date End Date Taking? Authorizing Provider  predniSONE (DELTASONE) 50 MG tablet Take 1 tablet (50 mg total) by mouth daily with breakfast. 12/14/20  Yes Lavonia Drafts, MD  acetaminophen (TYLENOL) 500 MG tablet Take 1,000 mg by mouth every 6 (six) hours as needed for moderate pain or headache. Patient not taking: Reported on 10/09/2020    [provider]  amoxicillin-clavulanate (AUGMENTIN) 875-125 MG tablet Take 1 tablet by mouth 2 (two) times daily. 10/09/20   Jearld Fenton, NP  aspirin EC 81 MG tablet Take 1 tablet (81 mg total) by mouth daily. Swallow whole. 04/16/20   Kate Sable, MD  benzonatate (TESSALON) 200 MG capsule Take 1 capsule (200 mg total) by mouth 2 (two) times daily as needed for cough. 10/09/20   Jearld Fenton, NP  clotrimazole-betamethasone (LOTRISONE) cream APPLY TOPICALLY TO GENITAL AREA FOR VAGINAL ITCHING AND IRRITATION FOR UP TO 2 WEEKS, THEN STOP 04/10/19   Karamalegos, Devonne Doughty, DO  ezetimibe (ZETIA) 10 MG tablet Take 1 tablet (10 mg total) by mouth daily. 04/20/20 07/19/20  Kate Sable, MD  furosemide (LASIX) 40 MG tablet Take 1 tablet (40 mg total) by mouth daily. 02/17/20  Wellington Hampshire, MD  hydrocortisone 2.5 % cream APPLY  CREAM TO AFFECTED AREA TWICE DAILY AS NEEDED ECZEMA 02/07/18   Parks Ranger, Devonne Doughty, DO  hydrOXYzine (ATARAX/VISTARIL) 25 MG tablet Take 1 tablet (25 mg total) by mouth 3 (three) times daily as needed for itching. 08/02/19   Parks Ranger, Devonne Doughty, DO  ibuprofen (ADVIL) 800 MG tablet TAKE 1 TABLET BY MOUTH EVERY 8 HOURS AS NEEDED FOR MODERATE PAIN 11/21/20   Parks Ranger, Devonne Doughty, DO   ketoconazole (NIZORAL) 2 % cream Apply 1 application topically daily as needed for irritation. 03/10/20   Karamalegos, Devonne Doughty, DO  lisinopril (ZESTRIL) 40 MG tablet Take 1 tablet (40 mg total) by mouth daily. 04/16/20 07/15/20  Kate Sable, MD  metoprolol succinate (TOPROL XL) 25 MG 24 hr tablet Take 1 tablet (25 mg total) by mouth daily. 11/27/20   Kate Sable, MD  orphenadrine (NORFLEX) 100 MG tablet TAKE 1 TABLET BY MOUTH TWICE DAILY AS NEEDED FOR MUSCLE SPASM 11/23/20   Parks Ranger, Devonne Doughty, DO  spironolactone (ALDACTONE) 25 MG tablet Take 1 tablet (25 mg total) by mouth daily. 04/16/20 07/15/20  Kate Sable, MD  triamcinolone cream (KENALOG) 0.5 % APPLY CREAM TOPICALLY TO AFFECTED AREA TWICE DAILY FOR UP TO 2 WEEKS 03/10/20   Karamalegos, Devonne Doughty, DO     Allergies Atorvastatin  Family History  Problem Relation Age of Onset   Stomach cancer Father    Kidney cancer Father    Diabetes Maternal Grandmother    Stroke Maternal Grandmother    Stroke Maternal Grandfather    Heart disease Paternal Grandmother    Stroke Paternal Grandfather    Diabetes Mother    Alcohol abuse Brother    Breast cancer Paternal Aunt    Lung cancer Paternal Aunt    Bladder Cancer Neg Hx    Prostate cancer Neg Hx     Social History Social History   Tobacco Use   Smoking status: Never   Smokeless tobacco: Never  Vaping Use   Vaping Use: Never used  Substance Use Topics   Alcohol use: Yes    Alcohol/week: 0.0 standard drinks    Comment: rarely   Drug use: No    Review of Systems  Constitutional: As above Eyes: No visual changes.  ENT: As above Cardiovascular: Denies chest pain. Respiratory: Denies shortness of breath.  Positive cough Gastrointestinal: No abdominal pain.  No nausea, no vomiting.   Genitourinary: Negative for dysuria. Musculoskeletal: Myalgia Skin: Negative for rash. Neurological: Positive  headache   ____________________________________________   PHYSICAL EXAM:  VITAL SIGNS: ED Triage Vitals  Enc Vitals Group     BP 12/14/20 1043 (!) 183/87     Pulse Rate 12/14/20 1043 74     Resp 12/14/20 1043 20     Temp 12/14/20 1043 98 F (36.7 C)     Temp Source 12/14/20 1043 Oral     SpO2 12/14/20 1043 98 %     Weight 12/14/20 1044 (!) 137 kg (302 lb)     Height 12/14/20 1044 1.651 m (5\' 5" )     Head Circumference --      Peak Flow --      Pain Score 12/14/20 1044 9     Pain Loc --      Pain Edu? --      Excl. in [place]? --     Constitutional: Alert and oriented. No acute distress. Pleasant and interactive Eyes: Conjunctivae are normal.  Head: Atraumatic. Nose: No congestion/rhinnorhea.  Cardiovascular: Normal rate, regular rhythm. Grossly normal heart sounds.  Good peripheral circulation. Respiratory: Normal respiratory effort.  No retractions. Lungs CTAB. Gastrointestinal: Soft and nontender. No distention.  No CVA tenderness. Genitourinary: deferred Musculoskeletal: No lower extremity tenderness nor edema.  Warm and well perfused Neurologic:  Normal speech and language. No gross focal neurologic deficits are appreciated.  Skin:  Skin is warm, dry and intact. No rash noted. Psychiatric: Mood and affect are normal. Speech and behavior are normal.  ____________________________________________   LABS (all labs ordered are listed, but only abnormal results are displayed)  Labs Reviewed  RESP PANEL BY RT-PCR (FLU A&B, COVID) ARPGX2 - Abnormal; Notable for the following components:      Result Value   SARS Coronavirus 2 by RT PCR POSITIVE (*)    All other components within normal limits  COMPREHENSIVE METABOLIC PANEL - Abnormal; Notable for the following components:   Anion gap 4 (*)    All other components within normal limits  CBC  TROPONIN I (HIGH SENSITIVITY)  TROPONIN I (HIGH SENSITIVITY)   ____________________________________________  EKG  ED ECG  REPORT I, Lavonia Drafts, the attending physician, personally viewed and interpreted this ECG.  Date: 12/14/2020  Rhythm: normal sinus rhythm QRS Axis: normal Intervals: normal ST/T Wave abnormalities: normal Narrative Interpretation: no evidence of acute ischemia  ____________________________________________  RADIOLOGY  Chest x-ray reviewed by me, no acute abnormality ____________________________________________   PROCEDURES  Procedure(s) performed: No  Procedures   Critical Care performed: No ____________________________________________   INITIAL IMPRESSION / ASSESSMENT AND PLAN / ED COURSE  Pertinent labs & imaging results that were available during my care of the patient were reviewed by me and considered in my medical decision making (see chart for details).   Patient presents with symptoms most suspicious for viral syndrome, very mild chest discomfort related to coughing, not consistent with ACS  EKG and-troponin are normal.  Chest x-ray is without evidence of pneumonia.  COVID PCR test has returned positive, she is out of the window for paxlovid recommend supportive care, outpatient follow-up as needed    ____________________________________________   FINAL CLINICAL IMPRESSION(S) / ED DIAGNOSES  Final diagnoses:  COVID-19        Note:  This document was prepared using Dragon voice recognition software and may include unintentional dictation errors.    Lavonia Drafts, MD 12/14/20 [phone]

## 2020-12-17 ENCOUNTER — Ambulatory Visit: Payer: Self-pay

## 2020-12-17 DIAGNOSIS — R051 Acute cough: Secondary | ICD-10-CM

## 2020-12-17 DIAGNOSIS — U071 COVID-19: Secondary | ICD-10-CM

## 2020-12-17 NOTE — Telephone Encounter (Signed)
  Pt. Reports she "feels a little better, but I'm still coughing white-yellow mucus. My voice is hoarse too. I think I need more Prednisone for my airways." Also requesting "a cough syrup. The perles help a little." Pt. Requesting Prednisone and cough syrup be sent to her pharmacy.   Answer Assessment - Initial Assessment Questions 1. ONSET: "When did the cough begin?"      Last  2. SEVERITY: "How bad is the cough today?"      Moderate 3. SPUTUM: "Describe the color of your sputum" (none, dry cough; clear, white, yellow, green)     Clear - to yellow 4. HEMOPTYSIS: "Are you coughing up any blood?" If so ask: "How much?" (flecks, streaks, tablespoons, etc.)     No 5. DIFFICULTY BREATHING: "Are you having difficulty breathing?" If Yes, ask: "How bad is it?" (e.g., mild, moderate, severe)    - MILD: No SOB at rest, mild SOB with walking, speaks normally in sentences, can lie down, no retractions, pulse < 100.    - MODERATE: SOB at rest, SOB with minimal exertion and prefers to sit, cannot lie down flat, speaks in phrases, mild retractions, audible wheezing, pulse 100-120.    - SEVERE: Very SOB at rest, speaks in single words, struggling to breathe, sitting hunched forward, retractions, pulse > 120      Mild 6. FEVER: "Do you have a fever?" If Yes, ask: "What is your temperature, how was it measured, and when did it start?"     No 7. CARDIAC HISTORY: "Do you have any history of heart disease?" (e.g., heart attack, congestive heart failure)      No 8. LUNG HISTORY: "Do you have any history of lung disease?"  (e.g., pulmonary embolus, asthma, emphysema)     No 9. PE RISK FACTORS: "Do you have a history of blood clots?" (or: recent major surgery, recent prolonged travel, bedridden)     No 10. OTHER SYMPTOMS: "Do you have any other symptoms?" (e.g., runny nose, wheezing, chest pain)       Hoarse voice, chest feels tight from cough 11. PREGNANCY: "Is there any chance you are pregnant?" "When was  your last menstrual period?"       No 12. TRAVEL: "Have you traveled out of the country in the last month?" (e.g., travel history, exposures)       No  Protocols used: Cough - Acute Productive-A-AH

## 2020-12-18 MED ORDER — PREDNISONE 20 MG PO TABS: ORAL_TABLET | ORAL | 0 refills | Status: DC

## 2020-12-18 MED ORDER — GUAIFENESIN-CODEINE 100-10 MG/5ML PO SYRP
5.0000 mL | ORAL_SOLUTION | Freq: Four times a day (QID) | ORAL | 0 refills | Status: DC | PRN
Start: 2020-12-18 — End: 2021-10-19

## 2020-12-18 NOTE — Telephone Encounter (Signed)
Patient was just seen in ED 12/14/20  Dx with COVID  I will go ahead and send her Prednisoe and Cough Syrup to pharmacy  Please notify her. Thanks!  Shannon Small, [facility] 12/18/2020, 9:52 AM

## 2020-12-18 NOTE — Addendum Note (Signed)
Addended by: Olin Hauser on: 12/18/2020 09:53 AM   Modules accepted: Orders

## 2021-01-05 ENCOUNTER — Ambulatory Visit: Payer: Self-pay

## 2021-01-05 ENCOUNTER — Telehealth: Payer: Self-pay | Admitting: Nurse Practitioner

## 2021-01-05 DIAGNOSIS — J0110 Acute frontal sinusitis, unspecified: Secondary | ICD-10-CM

## 2021-01-05 MED ORDER — AMOXICILLIN-POT CLAVULANATE 875-125 MG PO TABS: 1.0000 | ORAL_TABLET | Freq: Two times a day (BID) | ORAL | 0 refills | Status: AC

## 2021-01-05 NOTE — Telephone Encounter (Signed)
Chief Complaint: Sinus congestion Symptoms: Dizziness, cough, greenish-yellow mucus, bloody with scabs , chest pain (mild), sore throat Frequency: Since COVID, symptoms didn't improve Pertinent Negatives: Patient denies SOB Disposition: [] ED /[] Urgent Care (no appt availability in office) / [] Appointment(In office/virtual)/ [x]  [facility]/ [] Home Care/ [] Refused Recommended Disposition  Additional Notes: Visit scheduled for today at 1715.     Summary: sinus discomfort   The patient would like to be contacted by a member of clinical staff when possible   The patient has experienced sinus discomfort for roughly a week   The patient has noticed small scabs and blood when they blow their nose   The patient would like to be prescribed something to help with their congestion and discomfort   Please contact further        Reason for Disposition  [1] Sinus congestion (pressure, fullness) AND [2] present > 10 days  Answer Assessment - Initial Assessment Questions 1. LOCATION: "Where does it hurt?"      No pain 2. ONSET: "When did the sinus pain start?"  (e.g., hours, days)      No 3. SEVERITY: "How bad is the pain?"   (Scale 1-10; mild, moderate or severe)   - MILD (1-3): doesn't interfere with normal activities    - MODERATE (4-7): interferes with normal activities (e.g., work or school) or awakens from sleep   - SEVERE (8-10): excruciating pain and patient unable to do any normal activities        No 4. RECURRENT SYMPTOM: "Have you ever had sinus problems before?" If Yes, ask: "When was the last time?" and "What happened that time?"      N/A 5. NASAL CONGESTION: "Is the nose blocked?" If Yes, ask: "Can you open it or must you breathe through your mouth?"     No 6. NASAL DISCHARGE: "Do you have discharge from your nose?" If so ask, "What color?"     Greenish yellow, sometimes blood, other times scabs 7. FEVER: "Do you have a fever?" If Yes, ask: "What is it, how was  it measured, and when did it start?"      N/A 8. OTHER SYMPTOMS: "Do you have any other symptoms?" (e.g., sore throat, cough, earache, difficulty breathing)     Chest pain (a little), cough, dizzy, sore throat 9. PREGNANCY: "Is there any chance you are pregnant?" "When was your last menstrual period?"     N/A  Protocols used: Sinus Pain or Congestion-A-AH

## 2021-01-05 NOTE — Progress Notes (Signed)
Virtual Visit Consent   Shannon Small, you are scheduled for a virtual visit with a [facility] provider today.     Just as with appointments in the office, your consent must be obtained to participate.  Your consent will be active for this visit and any virtual visit you may have with one of our providers in the next 365 days.     If you have a MyChart account, a copy of this consent can be sent to you electronically.  All virtual visits are billed to your insurance company just like a traditional visit in the office.    As this is a virtual visit, video technology does not allow for your provider to perform a traditional examination.  This may limit your provider's ability to fully assess your condition.  If your provider identifies any concerns that need to be evaluated in person or the need to arrange testing (such as labs, EKG, etc.), we will make arrangements to do so.     Although advances in technology are sophisticated, we cannot ensure that it will always work on either your end or our end.  If the connection with a video visit is poor, the visit may have to be switched to a telephone visit.  With either a video or telephone visit, we are not always able to ensure that we have a secure connection.     I need to obtain your verbal consent now.   Are you willing to proceed with your visit today?    Shannon Small has provided verbal consent on 01/05/2021 for a virtual visit (telephone). Patient was unable to connect over over video visit    Shannon Schneiders, FNP   Date: 01/05/2021 5:42 PM   Virtual Visit via Video Note   I, Shannon Small, connected with  Shannon Small  ([id number], [birth year]) on 01/05/21 at  5:15 PM EST by a video-enabled telemedicine application and verified that I am speaking with the correct person using two identifiers.  Location: Patient: Virtual Visit Location Patient: Home Provider: Virtual Visit Location Provider: Home Office   I discussed the limitations of  evaluation and management by telemedicine and the availability of in person appointments. The patient expressed understanding and agreed to proceed.    History of Present Illness: Shannon Small is a 60 y.o. who identifies as a female who was assigned female at birth, and is being seen today for complaints of nasal congestion and pressure in her head that has been worsening over 3 days.   She had COVID three weeks ago she was on prednisone for that management. She has been on two rounds of prednisone this month.   The pressure in her head from sinus pressure is bothering her   She has been treated for sinus infections in the past with Augmentin.   She has been managing her symptoms with Alka Seltzer OTC    Problems:  Patient Active Problem List   Diagnosis Date Noted   HFrEF (heart failure with reduced ejection fraction) (HCC)    Cough 11/26/2019   Cough due to bronchospasm 10/25/2019   Muscle spasm of back 10/25/2019   COVID-19 virus infection 10/25/2019   Pure hypercholesterolemia 05/30/2019   Lymphedema 09/03/2018   Chronic venous insufficiency 09/03/2018   Morbid obesity ([place]) 09/26/2017   Osteoarthritis of knee 09/26/2017   Right knee pain 09/21/2017   Chronic cough 03/02/2016   Allergic rhinitis due to allergen 03/02/2016   Pre-diabetes 03/01/2016   Urinary  incontinence, mixed 03/01/2016   Hypertension 09/09/2014   Acid reflux 09/09/2014   Sleep related leg cramps 09/09/2014   Low back pain 01/15/2007   Benign neoplasm of skin 11/14/2006   Cephalalgia 02/10/2006   Morbid obesity with BMI of 50.0-59.9, adult ([place]) 02/10/2006    Allergies:  Allergies  Allergen Reactions   Atorvastatin Other (See Comments)    Myalgias   Medications:  Current Outpatient Medications:    acetaminophen (TYLENOL) 500 MG tablet, Take 1,000 mg by mouth every 6 (six) hours as needed for moderate pain or headache. (Patient not taking: Reported on 10/09/2020), Disp: , Rfl:     amoxicillin-clavulanate (AUGMENTIN) 875-125 MG tablet, Take 1 tablet by mouth 2 (two) times daily., Disp: 20 tablet, Rfl: 0   aspirin EC 81 MG tablet, Take 1 tablet (81 mg total) by mouth daily. Swallow whole., Disp: 90 tablet, Rfl: 3   benzonatate (TESSALON) 200 MG capsule, Take 1 capsule (200 mg total) by mouth 2 (two) times daily as needed for cough., Disp: 30 capsule, Rfl: 0   clotrimazole-betamethasone (LOTRISONE) cream, APPLY TOPICALLY TO GENITAL AREA FOR VAGINAL ITCHING AND IRRITATION FOR UP TO 2 WEEKS, THEN STOP, Disp: 45 g, Rfl: 3   ezetimibe (ZETIA) 10 MG tablet, Take 1 tablet (10 mg total) by mouth daily., Disp: 30 tablet, Rfl: 3   furosemide (LASIX) 40 MG tablet, Take 1 tablet (40 mg total) by mouth daily., Disp: 30 tablet, Rfl: 3   guaiFENesin-codeine (ROBITUSSIN AC) 100-10 MG/5ML syrup, Take 5-10 mLs by mouth 4 (four) times daily as needed for cough., Disp: 200 mL, Rfl: 0   hydrocortisone 2.5 % cream, APPLY  CREAM TO AFFECTED AREA TWICE DAILY AS NEEDED ECZEMA, Disp: 28 g, Rfl: 2   hydrOXYzine (ATARAX/VISTARIL) 25 MG tablet, Take 1 tablet (25 mg total) by mouth 3 (three) times daily as needed for itching., Disp: 60 tablet, Rfl: 2   ibuprofen (ADVIL) 800 MG tablet, TAKE 1 TABLET BY MOUTH EVERY 8 HOURS AS NEEDED FOR MODERATE PAIN, Disp: 90 tablet, Rfl: 2   ketoconazole (NIZORAL) 2 % cream, Apply 1 application topically daily as needed for irritation., Disp: 30 g, Rfl: 2   lisinopril (ZESTRIL) 40 MG tablet, Take 1 tablet (40 mg total) by mouth daily., Disp: 90 tablet, Rfl: 3   metoprolol succinate (TOPROL XL) 25 MG 24 hr tablet, Take 1 tablet (25 mg total) by mouth daily., Disp: 90 tablet, Rfl: 0   orphenadrine (NORFLEX) 100 MG tablet, TAKE 1 TABLET BY MOUTH TWICE DAILY AS NEEDED FOR MUSCLE SPASM, Disp: 60 tablet, Rfl: 0   predniSONE (DELTASONE) 20 MG tablet, Take daily with food. Start with 60mg  (3 pills) x 2 days, then reduce to 40mg  (2 pills) x 2 days, then 20mg  (1 pill) x 3 days, Disp: 13  tablet, Rfl: 0   spironolactone (ALDACTONE) 25 MG tablet, Take 1 tablet (25 mg total) by mouth daily., Disp: 90 tablet, Rfl: 3   triamcinolone cream (KENALOG) 0.5 %, APPLY CREAM TOPICALLY TO AFFECTED AREA TWICE DAILY FOR UP TO 2 WEEKS, Disp: 30 g, Rfl: 2  Current Facility-Administered Medications:    sodium chloride flush (NS) 0.9 % injection 3 mL, 3 mL, Intravenous, Q12H, Agbor-Etang, Aaron Edelman, MD  Observations/Objective: No physical exam performed this was a telephone visit only.  Patient was in no acute distress during phone conversation with provider   Assessment and Plan: 1. Acute non-recurrent frontal sinusitis  - amoxicillin-clavulanate (AUGMENTIN) 875-125 MG tablet; Take 1 tablet by mouth 2 (two) times  daily for 10 days. Take with food  Dispense: 20 tablet; Refill: 0    Continue to manage with OTC medications as discussed May use benzonatate for cough   Follow Up Instructions: I discussed the assessment and treatment plan with the patient. The patient was provided an opportunity to ask questions and all were answered. The patient agreed with the plan and demonstrated an understanding of the instructions.  A copy of instructions were sent to the patient via MyChart unless otherwise noted below.     The patient was advised to call back or seek an in-person evaluation if the symptoms worsen or if the condition fails to improve as anticipated.  Time:  I spent 10 minutes with the patient via telehealth technology discussing the above problems/concerns.    Shannon Schneiders, FNP

## 2021-01-23 IMAGING — US RIGHT LOWER EXTREMITY VENOUS ULTRASOUND
1 series · 13 of 24 positions shown · non-contrast
Comparison: None.

CLINICAL DATA: Right lower extremity pain and edema for the past
several months. Evaluate for DVT.



[Series 1: right lower extremity venous ultrasound · 13 of 29 slices shown]
[im 1/29]
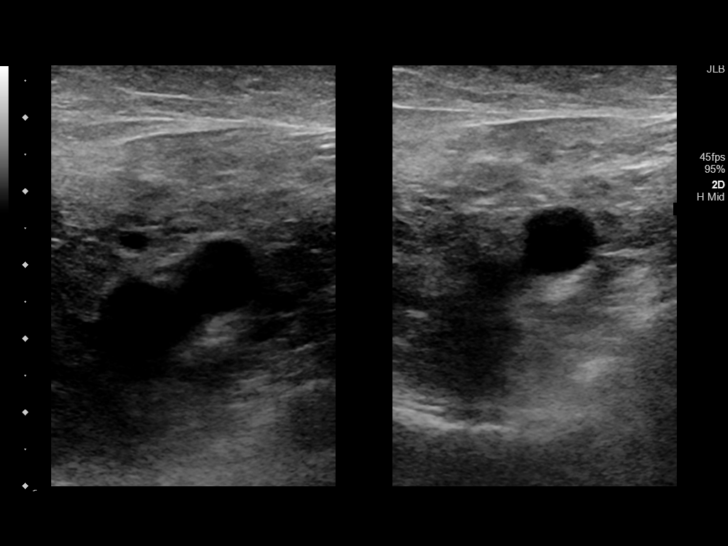
[im 3/29]
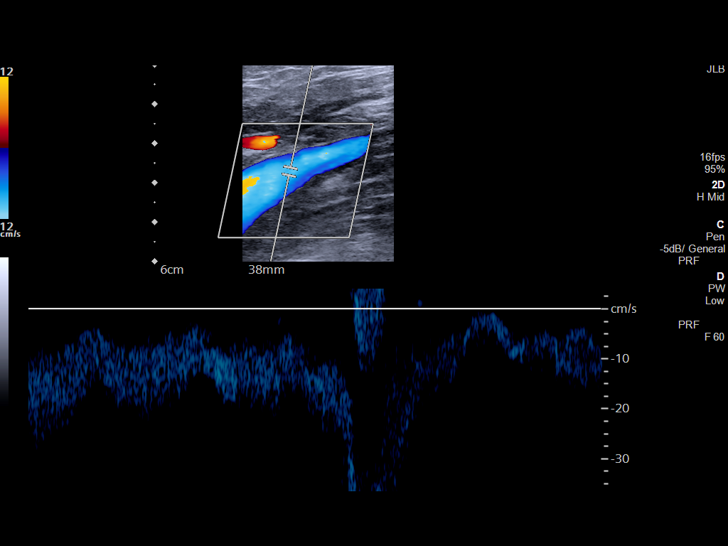
[im 5/29]
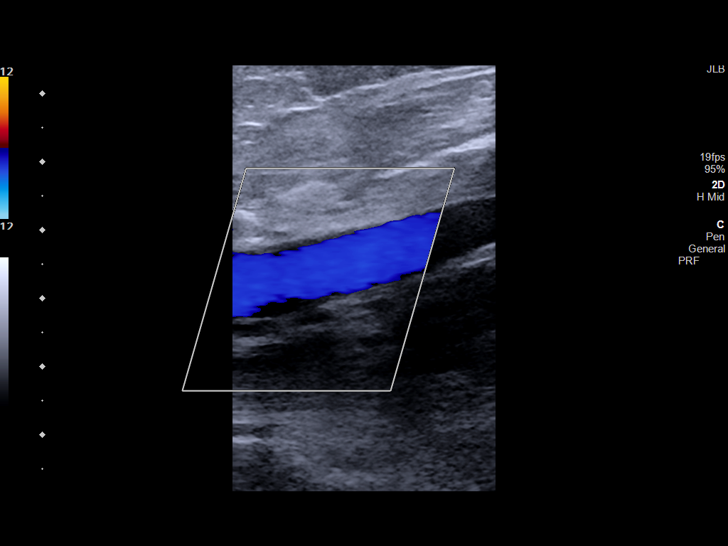
[im 8/29]
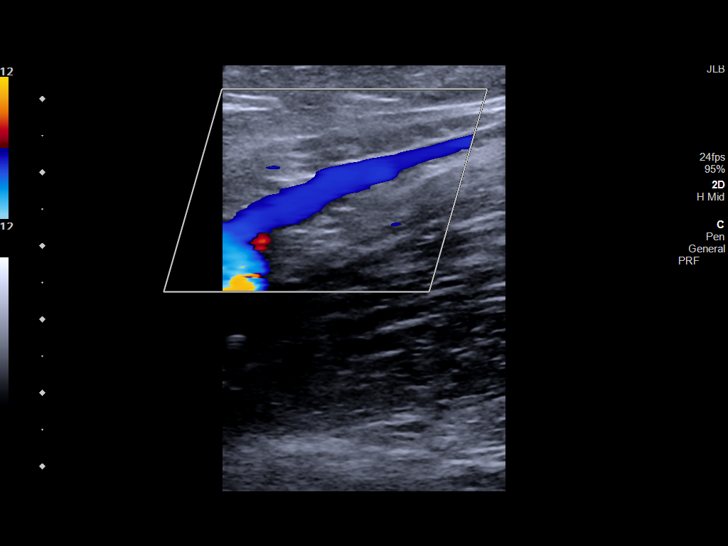
[im 10/29]
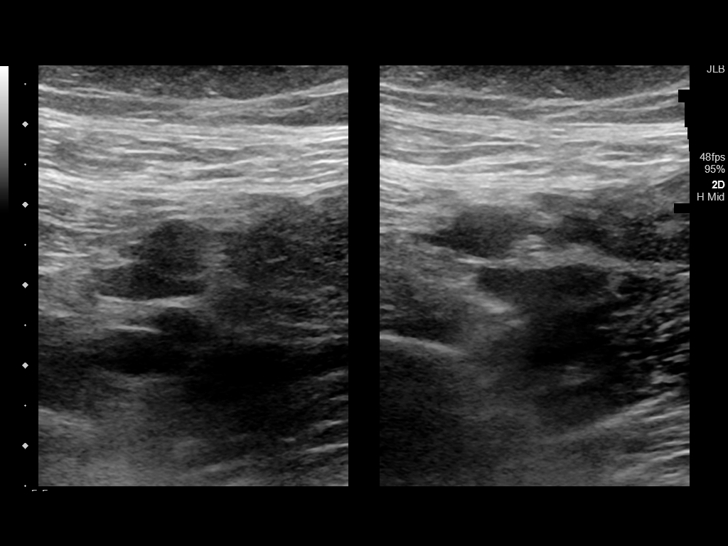
[im 13/29]
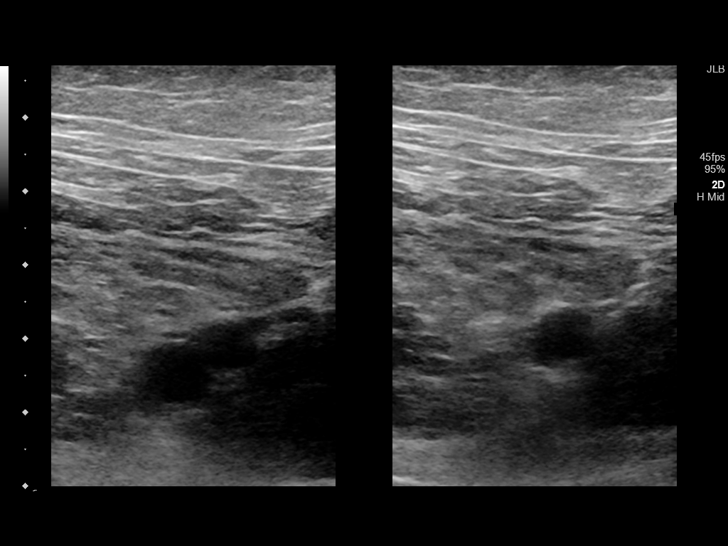
[im 15/29]
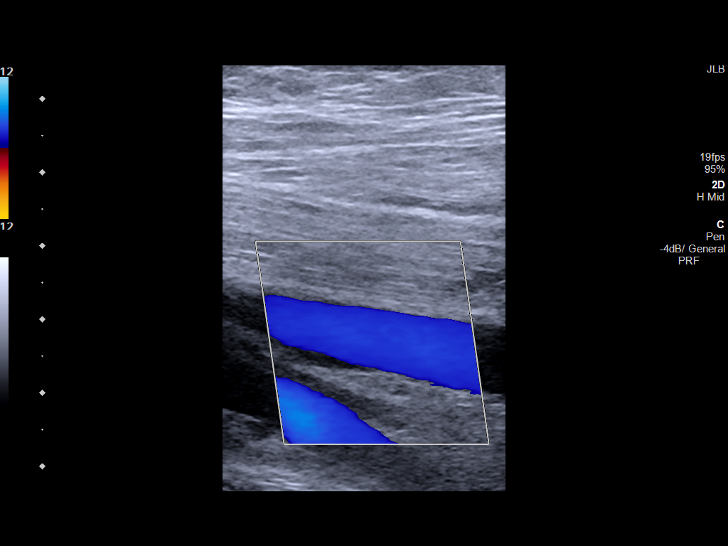
[im 16/29]
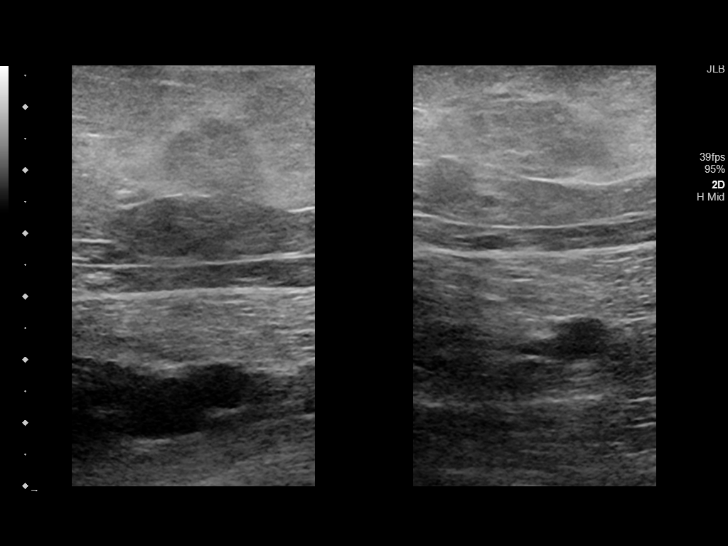
[im 19/29]
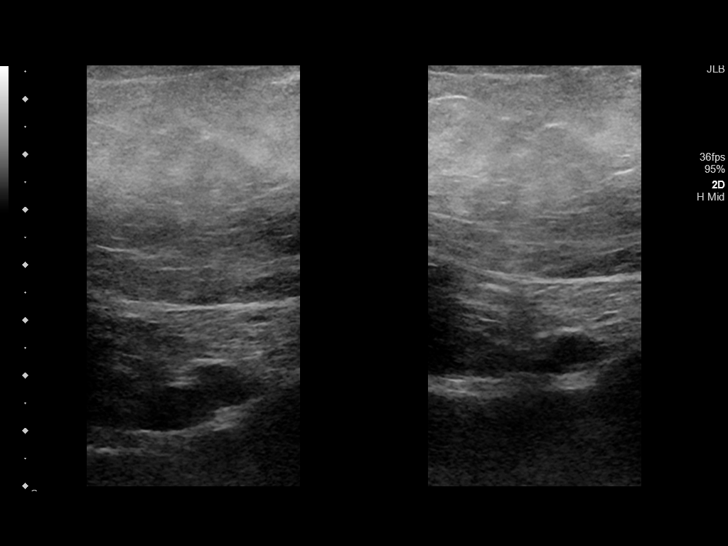
[im 21/29]
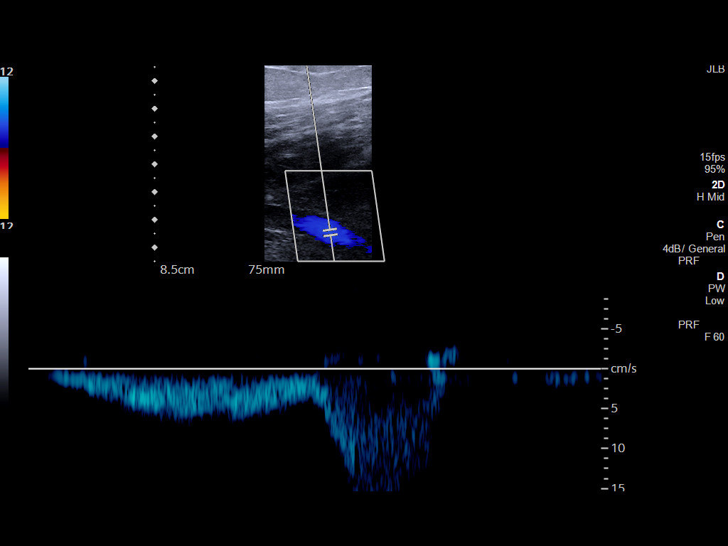
[im 24/29]
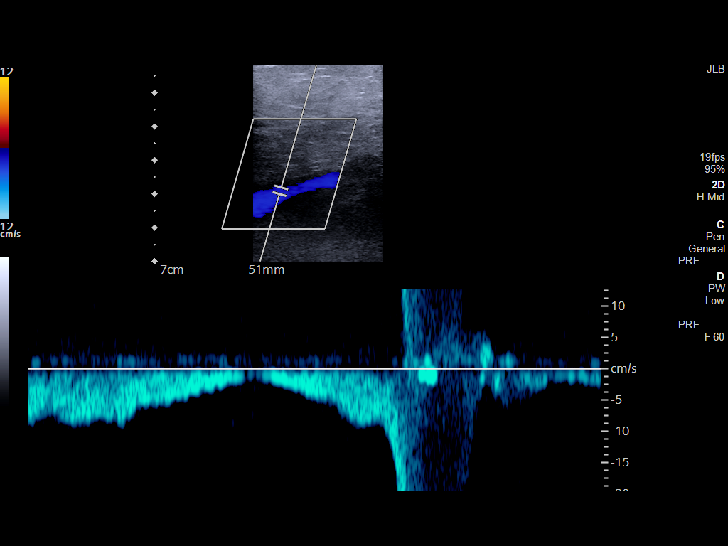
[im 26/29]
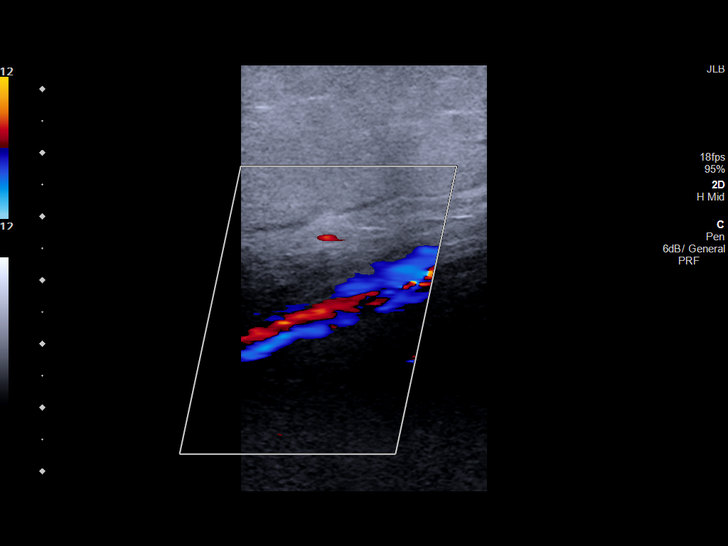
[im 29/29]
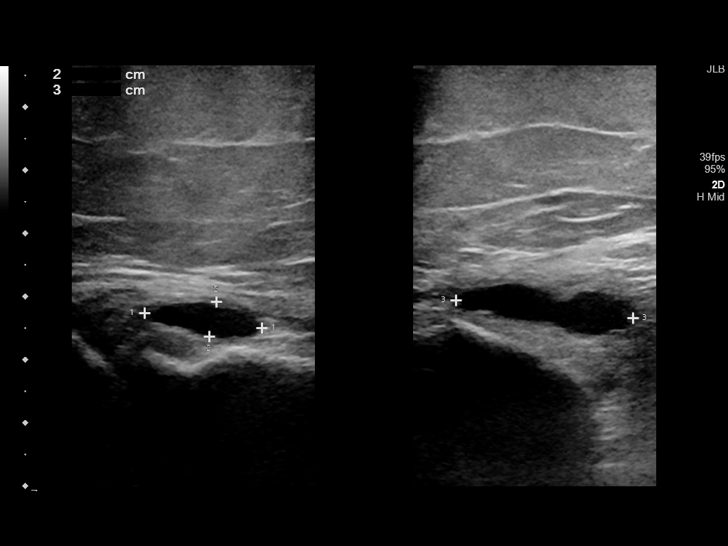

[13 of 24 positions shown; findings below may reference images not displayed]

FINDINGS: Contralateral Common Femoral Vein: Respiratory phasicity is normal
and symmetric with the symptomatic side. No evidence of thrombus.
Normal compressibility.

Common Femoral Vein: No evidence of thrombus. Normal
compressibility, respiratory phasicity and response to augmentation.

Saphenofemoral Junction: No evidence of thrombus. Normal
compressibility and flow on color Doppler imaging.

Profunda Femoral Vein: No evidence of thrombus. Normal
compressibility and flow on color Doppler imaging.

Femoral Vein: No evidence of thrombus. Normal compressibility,
respiratory phasicity and response to augmentation.

Popliteal Vein: No evidence of thrombus. Normal compressibility,
respiratory phasicity and response to augmentation.

Calf Veins: No evidence of thrombus. Normal compressibility and flow
on color Doppler imaging.

Superficial Great Saphenous Vein: No evidence of thrombus. Normal
compressibility.

Venous Reflux:  None.

Other Findings:  None.
IMPRESSION: No evidence of DVT within the right lower extremity.

## 2021-02-26 ENCOUNTER — Other Ambulatory Visit: Payer: Self-pay

## 2021-02-26 ENCOUNTER — Other Ambulatory Visit: Payer: Self-pay | Admitting: Family Medicine

## 2021-02-26 NOTE — Telephone Encounter (Signed)
Attempted to schedule no ans no vm  

## 2021-02-26 NOTE — Telephone Encounter (Signed)
Patient does not wish to schedule at this time due to lack of funds Patient will contact PCP to see if they can refill medication

## 2021-02-26 NOTE — Telephone Encounter (Signed)
*  STAT* If patient is at the pharmacy, call can be transferred to refill team.   1. Which medications need to be refilled? (please list name of each medication and dose if known) Metoprolol  2. Which pharmacy/location (including street and city if local pharmacy) is medication to be sent to? [place]  3. Do they need a 30 day or 90 day supply? [place]

## 2021-02-26 NOTE — Telephone Encounter (Signed)
Medication Refill - Medication:  metoprolol succinate (TOPROL XL) 25 MG 24 hr tablet   Has the patient contacted their pharmacy? Yes.   Contact PCP  Preferred Pharmacy (with phone number or street name):  [facility] (N), [place] [address]  [place], [place] [address]  Phone:  [phone]  Fax:  [phone]   Has the patient been seen for an appointment in the last year OR does the patient have an upcoming appointment? Yes.    Agent: Please be advised that RX refills may take up to 3 business days. We ask that you follow-up with your pharmacy.

## 2021-02-26 NOTE — Telephone Encounter (Signed)
Please contact pt for future appointment. Pt overdue for 6 month f/u. Pt needing refills. 

## 2021-02-26 NOTE — Telephone Encounter (Signed)
Please advise 

## 2021-02-27 MED ORDER — METOPROLOL SUCCINATE ER 25 MG PO TB24: 25.0000 mg | ORAL_TABLET | Freq: Every day | ORAL | 0 refills | Status: DC

## 2021-02-27 NOTE — Telephone Encounter (Signed)
Requested Prescriptions  Pending Prescriptions Disp Refills   metoprolol succinate (TOPROL XL) 25 MG 24 hr tablet 30 tablet 0    Sig: Take 1 tablet (25 mg total) by mouth daily.     Cardiovascular:  Beta Blockers Failed - 02/26/2021  5:00 PM      Failed - Last BP in normal range    BP Readings from Last 1 Encounters:  12/14/20 (!) 183/87         Passed - Last Heart Rate in normal range    Pulse Readings from Last 1 Encounters:  12/14/20 74         Passed - Valid encounter within last 6 months    Recent Outpatient Visits          4 months ago Acute non-recurrent frontal sinusitis   [facility] [place], Coralie Keens, NP   11 months ago Abrasion of fifth toe of left foot, initial encounter   [place], DO   1 year ago Acute non-recurrent frontal sinusitis   [facility] [place], Devonne Doughty, DO   1 year ago Cough   [facility], Lupita Raider, FNP   1 year ago Cough due to bronchospasm   [place], DO

## 2021-03-02 ENCOUNTER — Ambulatory Visit: Payer: Self-pay | Admitting: *Deleted

## 2021-03-02 NOTE — Telephone Encounter (Signed)
3rd attempt to contact patient regarding symptoms of congestion, stuffy nose, sore throat. No answer, VM box has not been set up unable to leave message. Please advise . Appt scheduled for 03/05/21.

## 2021-03-02 NOTE — Telephone Encounter (Signed)
2nd call attempted to contact patient to review sx. And review medication requests. No answer. VM box has not been set up unable to leave message.

## 2021-03-02 NOTE — Telephone Encounter (Signed)
Summary: congestion and medication request   Pt has congestion, stuffy nose and head and sore throat/ pt thinks it may be bronchitis / she asked for amoxicillin and prednisone to be called in for her / pt also scheduled an appt for Friday / she stated she couldn't come in sooner / please advise       Called patient to review URI sx vs covid sx. No answer, VM not set up unable to leave message. Will try call again later.

## 2021-03-04 ENCOUNTER — Encounter: Payer: Self-pay | Admitting: Family Medicine

## 2021-03-04 ENCOUNTER — Ambulatory Visit (INDEPENDENT_AMBULATORY_CARE_PROVIDER_SITE_OTHER): Payer: Self-pay | Admitting: Family Medicine

## 2021-03-04 VITALS — Ht 65.0 in | Wt 302.0 lb

## 2021-03-04 DIAGNOSIS — T3695XA Adverse effect of unspecified systemic antibiotic, initial encounter: Secondary | ICD-10-CM

## 2021-03-04 DIAGNOSIS — J0110 Acute frontal sinusitis, unspecified: Secondary | ICD-10-CM

## 2021-03-04 DIAGNOSIS — B379 Candidiasis, unspecified: Secondary | ICD-10-CM

## 2021-03-04 MED ORDER — FLUCONAZOLE 150 MG PO TABS: ORAL_TABLET | ORAL | 0 refills | Status: DC

## 2021-03-04 MED ORDER — BENZONATATE 200 MG PO CAPS: 200.0000 mg | ORAL_CAPSULE | Freq: Two times a day (BID) | ORAL | 0 refills | Status: DC | PRN

## 2021-03-04 MED ORDER — LEVOFLOXACIN 500 MG PO TABS: 500.0000 mg | ORAL_TABLET | Freq: Every day | ORAL | 0 refills | Status: DC

## 2021-03-04 MED ORDER — PREDNISONE 20 MG PO TABS: ORAL_TABLET | ORAL | 0 refills | Status: DC

## 2021-03-04 NOTE — Patient Instructions (Signed)
° °  Please schedule a Follow-up Appointment to: No follow-ups on file. ° °If you have any other questions or concerns, please feel free to call the office or send a message through MyChart. You may also schedule an earlier appointment if necessary. ° °Additionally, you may be receiving a survey about your experience at our office within a few days to 1 week by e-mail or mail. We value your feedback. ° °Latif Nazareno, DO °[facility] °

## 2021-03-04 NOTE — Progress Notes (Signed)
Virtual Visit via Telephone The purpose of this virtual visit is to provide medical care while limiting exposure to the novel coronavirus (COVID19) for both patient and office staff.  Consent was obtained for phone visit:  Yes.   Answered questions that patient had about telehealth interaction:  Yes.   I discussed the limitations, risks, security and privacy concerns of performing an evaluation and management service by telephone. I also discussed with the patient that there may be a patient responsible charge related to this service. The patient expressed understanding and agreed to proceed.  Patient Location: Home Provider Location: [facility] (Office)  Participants in virtual visit: - Patient: Shannon Small - CMA: Orinda Kenner, CMA - Provider: Dr Parks Ranger  ---------------------------------------------------------------------- Chief Complaint  Patient presents with   Nasal Congestion    S: Reviewed CMA documentation. I have called patient and gathered additional HPI as follows:  Acute Sinusitis Reports that symptoms started within past 1 week, husband sick as well. She did COVID test negative. No fever. Describes sinus pain pressure drainage cough productive, ear pain fullness - Tried OTC without relief Previously had Augmentin back in December for similar In past uses Steroid taper if having worse breathing cough Admitssinus pain or pressure, headache, Denies any fevers, chills, sweats, body ache, shortness of breath, abdominal pain, diarrhea  Past Medical History:  Diagnosis Date   Acid reflux 09/09/2014   Adjustment disorder with depressed mood 11/14/2006   ASSESSMENT: Bereavement. Recommend counseling with pastor or psychologist.    Benign neoplasm of skin 11/14/2006   Cephalalgia 02/10/2006   Chronic infection of sinus 09/09/2014   Extreme obesity 02/10/2006   Hypertension    LBP (low back pain) 01/15/2007   Chronic back strain and spasms due to obesity.     Sleep related leg cramps 09/09/2014   Social History   Tobacco Use   Smoking status: Never   Smokeless tobacco: Never  Vaping Use   Vaping Use: Never used  Substance Use Topics   Alcohol use: Yes    Alcohol/week: 0.0 standard drinks    Comment: rarely   Drug use: No    Current Outpatient Medications:    aspirin EC 81 MG tablet, Take 1 tablet (81 mg total) by mouth daily. Swallow whole., Disp: 90 tablet, Rfl: 3   clotrimazole-betamethasone (LOTRISONE) cream, APPLY TOPICALLY TO GENITAL AREA FOR VAGINAL ITCHING AND IRRITATION FOR UP TO 2 WEEKS, THEN STOP, Disp: 45 g, Rfl: 3   fluconazole (DIFLUCAN) 150 MG tablet, Take one tablet by mouth on Day 1. Repeat dose 2nd tablet on Day 3., Disp: 2 tablet, Rfl: 0   furosemide (LASIX) 40 MG tablet, Take 1 tablet (40 mg total) by mouth daily., Disp: 30 tablet, Rfl: 3   guaiFENesin-codeine (ROBITUSSIN AC) 100-10 MG/5ML syrup, Take 5-10 mLs by mouth 4 (four) times daily as needed for cough., Disp: 200 mL, Rfl: 0   hydrocortisone 2.5 % cream, APPLY  CREAM TO AFFECTED AREA TWICE DAILY AS NEEDED ECZEMA, Disp: 28 g, Rfl: 2   hydrOXYzine (ATARAX/VISTARIL) 25 MG tablet, Take 1 tablet (25 mg total) by mouth 3 (three) times daily as needed for itching., Disp: 60 tablet, Rfl: 2   ibuprofen (ADVIL) 800 MG tablet, TAKE 1 TABLET BY MOUTH EVERY 8 HOURS AS NEEDED FOR MODERATE PAIN, Disp: 90 tablet, Rfl: 2   ketoconazole (NIZORAL) 2 % cream, Apply 1 application topically daily as needed for irritation., Disp: 30 g, Rfl: 2   levofloxacin (LEVAQUIN) 500 MG tablet,  Take 1 tablet (500 mg total) by mouth daily. For 7 days, Disp: 7 tablet, Rfl: 0   metoprolol succinate (TOPROL XL) 25 MG 24 hr tablet, Take 1 tablet (25 mg total) by mouth daily., Disp: 30 tablet, Rfl: 0   orphenadrine (NORFLEX) 100 MG tablet, TAKE 1 TABLET BY MOUTH TWICE DAILY AS NEEDED FOR MUSCLE SPASM, Disp: 60 tablet, Rfl: 0   predniSONE (DELTASONE) 20 MG tablet, Take daily with food. Start with 60mg  (3  pills) x 2 days, then reduce to 40mg  (2 pills) x 2 days, then 20mg  (1 pill) x 3 days, Disp: 13 tablet, Rfl: 0   triamcinolone cream (KENALOG) 0.5 %, APPLY CREAM TOPICALLY TO AFFECTED AREA TWICE DAILY FOR UP TO 2 WEEKS, Disp: 30 g, Rfl: 2   acetaminophen (TYLENOL) 500 MG tablet, Take 1,000 mg by mouth every 6 (six) hours as needed for moderate pain or headache. (Patient not taking: Reported on 10/09/2020), Disp: , Rfl:    benzonatate (TESSALON) 200 MG capsule, Take 1 capsule (200 mg total) by mouth 2 (two) times daily as needed for cough., Disp: 30 capsule, Rfl: 0   ezetimibe (ZETIA) 10 MG tablet, Take 1 tablet (10 mg total) by mouth daily., Disp: 30 tablet, Rfl: 3   lisinopril (ZESTRIL) 40 MG tablet, Take 1 tablet (40 mg total) by mouth daily., Disp: 90 tablet, Rfl: 3   spironolactone (ALDACTONE) 25 MG tablet, Take 1 tablet (25 mg total) by mouth daily., Disp: 90 tablet, Rfl: 3  Current Facility-Administered Medications:    sodium chloride flush (NS) 0.9 % injection 3 mL, 3 mL, Intravenous, Q12H, Kate Sable, MD  Depression screen [facility] 2/9 12/16/2019 03/05/2019 01/28/2019  Decreased Interest 0 0 0  Down, Depressed, Hopeless 1 0 0  PHQ - 2 Score 1 0 0  Altered sleeping 1 0 0  Tired, decreased energy 3 0 0  Change in appetite 2 0 0  Feeling bad or failure about yourself  1 0 0  Trouble concentrating 0 0 0  Moving slowly or fidgety/restless 0 0 0  Suicidal thoughts 0 0 0  PHQ-9 Score 8 0 0  Difficult doing work/chores Somewhat difficult Not difficult at all Not difficult at all    GAD 7 : Generalized Anxiety Score 12/16/2019 03/05/2019  Nervous, Anxious, on Edge 1 0  Control/stop worrying 3 0  Worry too much - different things 2 0  Trouble relaxing 2 0  Restless 1 0  Easily annoyed or irritable 2 0  Afraid - awful might happen 0 0  Total GAD 7 Score 11 0  Anxiety Difficulty Somewhat difficult Not difficult at all     -------------------------------------------------------------------------- O: No physical exam performed due to remote telephone encounter.  Lab results reviewed.  Recent Results (from the past 2160 hour(s))  CBC     Status: None   Collection Time: 12/14/20 10:55 AM  Result Value Ref Range   WBC 8.4 4.0 - 10.5 K/uL   RBC 4.16 3.87 - 5.11 MIL/uL   Hemoglobin 12.1 12.0 - 15.0 g/dL   HCT 37.6 36.0 - 46.0 %   MCV 90.4 80.0 - 100.0 fL   MCH 29.1 26.0 - 34.0 pg   MCHC 32.2 30.0 - 36.0 g/dL   RDW 13.2 11.5 - 15.5 %   Platelets 245 150 - 400 K/uL   nRBC 0.0 0.0 - 0.2 %    Comment: Performed at [facility], [address], [place], [place] [address]  Comprehensive metabolic panel  Status: Abnormal   Collection Time: 12/14/20 10:55 AM  Result Value Ref Range   Sodium 137 135 - 145 mmol/L   Potassium 4.2 3.5 - 5.1 mmol/L   Chloride 108 98 - 111 mmol/L   CO2 25 22 - 32 mmol/L   Glucose, Bld 98 70 - 99 mg/dL    Comment: Glucose reference range applies only to samples taken after fasting for at least 8 hours.   BUN 17 6 - 20 mg/dL   Creatinine, Ser 0.66 0.44 - 1.00 mg/dL   Calcium 8.9 8.9 - 10.3 mg/dL   Total Protein 7.3 6.5 - 8.1 g/dL   Albumin 3.7 3.5 - 5.0 g/dL   AST 18 15 - 41 U/L   ALT 18 0 - 44 U/L   Alkaline Phosphatase 68 38 - 126 U/L   Total Bilirubin 0.6 0.3 - 1.2 mg/dL   GFR, Estimated >60 >60 mL/min    Comment: (NOTE) Calculated using the CKD-EPI Creatinine Equation (2021)    Anion gap 4 (L) 5 - 15    Comment: Performed at [facility], [place], [place] [address]  Troponin I (High Sensitivity)     Status: None   Collection Time: 12/14/20 10:55 AM  Result Value Ref Range   Troponin I (High Sensitivity) 5 <18 ng/L    Comment: (NOTE) Elevated high sensitivity troponin I (hsTnI) values and significant  changes across serial measurements may suggest ACS but many other  chronic and acute conditions are known to elevate hsTnI  results.  Refer to the "Links" section for chest pain algorithms and additional  guidance. Performed at [facility], [address] [place], [place] [address]   Resp Panel by RT-PCR (Flu A&B, Covid) Nasopharyngeal Swab     Status: Abnormal   Collection Time: 12/14/20 10:55 AM   Specimen: Nasopharyngeal Swab; Nasopharyngeal(NP) swabs in vial transport medium  Result Value Ref Range   SARS Coronavirus 2 by RT PCR POSITIVE (A) NEGATIVE    Comment: RESULT CALLED TO, READ BACK BY AND VERIFIED WITH: C/LORRIE LEMINS 1221 12/14/20 AMK (NOTE) SARS-CoV-2 target nucleic acids are DETECTED.  The SARS-CoV-2 RNA is generally detectable in upper respiratory specimens during the acute phase of infection. Positive results are indicative of the presence of the identified virus, but do not rule out bacterial infection or co-infection with other pathogens not detected by the test. Clinical correlation with patient history and other diagnostic information is necessary to determine patient infection status. The expected result is Negative.  Fact Sheet for Patients: EntrepreneurPulse.com.au  Fact Sheet for Healthcare Providers: IncredibleEmployment.be  This test is not yet approved or cleared by the [place] FDA and  has been authorized for detection and/or diagnosis of SARS-CoV-2 by FDA under an Emergency Use Authorization (EUA).  This EUA will remain in effect (meaning this test can be  used) for the duration of  the COVID-19 declaration under Section 564(b)(1) of the Act, 21 U.S.C. section 360bbb-3(b)(1), unless the authorization is terminated or revoked sooner.     Influenza A by PCR NEGATIVE NEGATIVE   Influenza B by PCR NEGATIVE NEGATIVE    Comment: (NOTE) The Xpert Xpress SARS-CoV-2/FLU/RSV plus assay is intended as an aid in the diagnosis of influenza from Nasopharyngeal swab specimens and should not be used as a sole basis for  treatment. Nasal washings and aspirates are unacceptable for Xpert Xpress SARS-CoV-2/FLU/RSV testing.  Fact Sheet for Patients: EntrepreneurPulse.com.au  Fact Sheet for Healthcare Providers: IncredibleEmployment.be  This test  is not yet approved or cleared by the [place] and has been authorized for detection and/or diagnosis of SARS-CoV-2 by FDA under an Emergency Use Authorization (EUA). This EUA will remain in effect (meaning this test can be used) for the duration of the COVID-19 declaration under Section 564(b)(1) of the Act, 21 U.S.C. section 360bbb-3(b)(1), unless the authorization is terminated or revoked.  Performed at [facility], [address] [place], [place] [address]     -------------------------------------------------------------------------- A&P:  Problem List Items Addressed This Visit   None Visit Diagnoses     Acute non-recurrent frontal sinusitis    -  Primary   Relevant Medications   levofloxacin (LEVAQUIN) 500 MG tablet   predniSONE (DELTASONE) 20 MG tablet   benzonatate (TESSALON) 200 MG capsule   fluconazole (DIFLUCAN) 150 MG tablet   Antibiotic-induced yeast infection       Relevant Medications   fluconazole (DIFLUCAN) 150 MG tablet      Consistent with acute frontal rhinosinusitis, likely initially viral URI with worsening concern for bacterial infection.   Plan: 1. Start taking Levaquin antibiotic 500mg  daily x 7 days 2. Prednisone taper 3. Benzonatate PRN 4. Diflucan If need from antibiotic Return criteria reviewed     Meds ordered this encounter  Medications   levofloxacin (LEVAQUIN) 500 MG tablet    Sig: Take 1 tablet (500 mg total) by mouth daily. For 7 days    Dispense:  7 tablet    Refill:  0   predniSONE (DELTASONE) 20 MG tablet    Sig: Take daily with food. Start with 60mg  (3 pills) x 2 days, then reduce to 40mg  (2 pills) x 2 days, then 20mg  (1 pill) x 3 days     Dispense:  13 tablet    Refill:  0   benzonatate (TESSALON) 200 MG capsule    Sig: Take 1 capsule (200 mg total) by mouth 2 (two) times daily as needed for cough.    Dispense:  30 capsule    Refill:  0   fluconazole (DIFLUCAN) 150 MG tablet    Sig: Take one tablet by mouth on Day 1. Repeat dose 2nd tablet on Day 3.    Dispense:  2 tablet    Refill:  0    Follow-up: PRN  Patient verbalizes understanding with the above medical recommendations including the limitation of remote medical advice.  Specific follow-up and call-back criteria were given for patient to follow-up or seek medical care more urgently if needed.   - Time spent in direct consultation with patient on phone: 7 minutes   [name], [facility] 03/04/2021, 9:20 AM

## 2021-03-05 ENCOUNTER — Telehealth: Payer: Self-pay | Admitting: Family Medicine

## 2021-03-05 ENCOUNTER — Ambulatory Visit: Payer: Self-pay | Admitting: Family Medicine

## 2021-03-09 ENCOUNTER — Ambulatory Visit: Payer: Self-pay | Admitting: *Deleted

## 2021-03-09 DIAGNOSIS — J0110 Acute frontal sinusitis, unspecified: Secondary | ICD-10-CM

## 2021-03-09 MED ORDER — AMOXICILLIN-POT CLAVULANATE 875-125 MG PO TABS: 1.0000 | ORAL_TABLET | Freq: Two times a day (BID) | ORAL | 0 refills | Status: DC

## 2021-03-09 NOTE — Telephone Encounter (Signed)
Okay, she should discontinue Levaquin immediately and switch to the Augmentin.  It may not work, as a lot of congestion is common with these infections but does not always mean it is caused by bacteria.  [name], [facility] 03/09/2021, 12:09 PM

## 2021-03-09 NOTE — Telephone Encounter (Signed)
Pt stated the antibiotic levofloxacin (LEVAQUIN) 500 MG tablet given to her by PCP is not working. Pt stated she is still has a lot of congestion and sinus issues.  Pt requesting Amoxicillin.   Seeking clinical advice.      Attempted to reach, VM not set up.

## 2021-03-09 NOTE — Telephone Encounter (Signed)
Summary: congestion and sinus issues.   Pt stated the antibiotic levofloxacin (LEVAQUIN) 500 MG tablet given to her by PCP is not working. Pt stated she is still has a lot of congestion and sinus issues.  Pt requesting Amoxicillin.   Seeking clinical advice.       Called patient to review medication change / request. Patient answered and states" please stop calling " and "bothering me, I don't want to talk". Unable to review symptoms. Please see request from patient to change antibiotics from levaquin to amoxicillin.

## 2021-03-11 NOTE — Telephone Encounter (Signed)
Message left for pt with recommendations.  ?

## 2021-04-02 ENCOUNTER — Telehealth: Payer: Self-pay | Admitting: Family Medicine

## 2021-04-02 ENCOUNTER — Other Ambulatory Visit: Payer: Self-pay | Admitting: Family Medicine

## 2021-04-02 DIAGNOSIS — S39012A Strain of muscle, fascia and tendon of lower back, initial encounter: Secondary | ICD-10-CM

## 2021-04-02 DIAGNOSIS — M62830 Muscle spasm of back: Secondary | ICD-10-CM

## 2021-04-02 NOTE — Telephone Encounter (Signed)
Copied from [place] [id number]. Topic: Quick Communication - Rx Refill/Question ?>> Apr 02, 2021  9:46 AM Leward Quan A wrote: ?Medication: orphenadrine (NORFLEX) 100 MG tablet ? ?Has the patient contacted their pharmacy? No. Called office since only have one pill left ?(Agent: If no, request that the patient contact the pharmacy for the refill. If patient does not wish to contact the pharmacy document the reason why and proceed with request.) ?(Agent: If yes, when and what did the pharmacy advise?) ? ?Preferred Pharmacy (with phone number or street name): [facility] (N), [place]  ?Phone:  [phone] ?Fax:  [phone] ? ? ? ?Has the patient been seen for an appointment in the last year OR does the patient have an upcoming appointment? Yes.   ? ?Agent: Please be advised that RX refills may take up to 3 business days. We ask that you follow-up with your pharmacy. ?

## 2021-04-05 NOTE — Telephone Encounter (Signed)
Medication refill sent on 04/05/21.  ?

## 2021-04-05 NOTE — Telephone Encounter (Signed)
I am returning to office after out on PAL for 3 days last week is reason for delay in refills today. I am catching up on everything today. ?

## 2021-04-05 NOTE — Telephone Encounter (Signed)
Pt called and stated it has never taken this long to get a refill/ she stated she needs this refilled today / please advise about her orphenadrine (NORFLEX) 100 MG tablet ?

## 2021-04-05 NOTE — Telephone Encounter (Signed)
Requested medication (s) are due for refill today: Yes ? ?Requested medication (s) are on the active medication list: Yes ? ?Last refill:  11/23/20 ? ?Future visit scheduled: No ? ?Notes to clinic:  See request. ? ? ? ?Requested Prescriptions  ?Pending Prescriptions Disp Refills  ? orphenadrine (NORFLEX) 100 MG tablet [Pharmacy Med Name: Orphenadrine Citrate ER 100 MG Oral Tablet Extended Release 12 Hour] 60 tablet 0  ?  Sig: TAKE 1 TABLET BY MOUTH TWICE DAILY AS NEEDED FOR MUSCLE SPASM  ?  ? Not Delegated - Analgesics:  Muscle Relaxants Failed - 04/02/2021  9:43 AM  ?  ?  Failed - This refill cannot be delegated  ?  ?  Passed - Valid encounter within last 6 months  ?  Recent Outpatient Visits   ? ?      ? 1 month ago Acute non-recurrent frontal sinusitis  ? [place], DO  ? 5 months ago Acute non-recurrent frontal sinusitis  ? [facility] [place], [place], NP  ? 1 year ago Abrasion of fifth toe of left foot, initial encounter  ? [place], DO  ? 1 year ago Acute non-recurrent frontal sinusitis  ? [place], DO  ? 1 year ago Cough  ? [facility], Lupita Raider, FNP  ? ?  ?  ? ?  ?  ?  ? ?

## 2021-04-07 ENCOUNTER — Ambulatory Visit: Payer: Self-pay | Admitting: *Deleted

## 2021-04-07 NOTE — Telephone Encounter (Signed)
Advised pt she would need to be seen before she can be prescribed any medication.  Pt declines at this time.  I advised her to call back if she changes her mind.  ? ?Thanks,  ? ?-Mickel Baas  ?

## 2021-04-07 NOTE — Telephone Encounter (Signed)
?  Chief Complaint: medication request- patient requesting prednisone ?Symptoms: cough, chest congestion, sinus pressure, ear pain ?Frequency: 2 weeks- has gotten worse last 2 days ?Pertinent Negatives: Patient denies   ?Disposition: '[]'$ ED /'[]'$ Urgent Care (no appt availability in office) / '[]'$ Appointment(In office/virtual)/ '[]'$  [facility]/ '[]'$ Home Care/ '[x]'$ Refused Recommended Disposition /'[]'$  Mobile Bus/ '[]'$  Follow-up with PCP ?Additional Notes: offered to make patient appointment- she states she does not have insurance and she can afford appointment- she states she may be able to afford medication. Advised I would send request- but she may have to be seen since she did not improve  ?

## 2021-04-07 NOTE — Telephone Encounter (Signed)
Reason for Disposition ? Continuous (nonstop) coughing interferes with work, school, or sleeping ? ?Answer Assessment - Initial Assessment Questions ?1. DRUG NAME: "What medicine do you need to have refilled?" ?    Prednisone request ?2. REFILLS REMAINING: "How many refills are remaining?" (Note: The label on the medicine or pill bottle will show how many refills are remaining. If there are no refills remaining, then a renewal may be needed.) ?    no ?3. EXPIRATION DATE: "What is the expiration date?" (Note: The label states when the prescription will expire, and thus can no longer be refilled.) ?    no ?4. PRESCRIBING HCP: "Who prescribed it?" Reason: If prescribed by specialist, call should be referred to that group. ?    PCP ?5. SYMPTOMS: "Do you have any symptoms?" ?    Cough, congestion, SOB ? ?Answer Assessment - Initial Assessment Questions ?1. RESPIRATORY STATUS: "Describe your breathing?" (e.g., wheezing, shortness of breath, unable to speak, severe coughing)  ?    SOB- coughing, exertion ?2. ONSET: "When did this breathing problem begin?"  ?    2 days- feels worse ?3. PATTERN "Does the difficult breathing come and go, or has it been constant since it started?"  ?    Constant- sinus infection- ear pain ?4. SEVERITY: "How bad is your breathing?" (e.g., mild, moderate, severe)  ?  - MILD: No SOB at rest, mild SOB with walking, speaks normally in sentences, can lie down, no retractions, pulse < 100.  ?  - MODERATE: SOB at rest, SOB with minimal exertion and prefers to sit, cannot lie down flat, speaks in phrases, mild retractions, audible wheezing, pulse 100-120.  ?  - SEVERE: Very SOB at rest, speaks in single words, struggling to breathe, sitting hunched forward, retractions, pulse > 120  ?    mild ?5. RECURRENT SYMPTOM: "Have you had difficulty breathing before?" If Yes, ask: "When was the last time?" and "What happened that time?"  ?    Yes- 2-3 weeks ago ?6. CARDIAC HISTORY: "Do you have any history of  heart disease?" (e.g., heart attack, angina, bypass surgery, angioplasty)  ?    no ?7. LUNG HISTORY: "Do you have any history of lung disease?"  (e.g., pulmonary embolus, asthma, emphysema) ?    no ?8. CAUSE: "What do you think is causing the breathing problem?"  ?    Sinus infection ?9. OTHER SYMPTOMS: "Do you have any other symptoms? (e.g., dizziness, runny nose, cough, chest pain, fever) ?    Cough, chest soreness ?10. O2 SATURATION MONITOR:  "Do you use an oxygen saturation monitor (pulse oximeter) at home?" If Yes, "What is your reading (oxygen level) today?" "What is your usual oxygen saturation reading?" (e.g., 95%) ?      *No Answer* ?11. PREGNANCY: "Is there any chance you are pregnant?" "When was your last menstrual period?" ?      *No Answer* ?12. TRAVEL: "Have you traveled out of the country in the last month?" (e.g., travel history, exposures) ?      *No Answer* ? ?Protocols used: Medication Refill and Renewal Call-A-AH, Breathing Difficulty-A-AH ? ?

## 2021-04-12 ENCOUNTER — Telehealth: Payer: Self-pay | Admitting: Family Medicine

## 2021-04-12 ENCOUNTER — Ambulatory Visit: Payer: Self-pay

## 2021-04-12 NOTE — Telephone Encounter (Signed)
?  Chief Complaint: SOB - Chest congestion ?Symptoms: IBID ?Frequency: 5 days ?Pertinent Negatives: Patient denies fever ?Disposition: '[]'$ ED /'[]'$ Urgent Care (no appt availability in office) / '[x]'$ Appointment(In office/virtual)/ '[]'$  [facility]/ '[]'$ Home Care/ '[]'$ Refused Recommended Disposition /'[]'$  Mobile [facility]/ '[]'$  Follow-up with PCP ?Additional Notes: Pt states that she needs steroid for SOB. Pt has cough and chest congestion - Just cannot seem to get better. Made virtual appt. ? ?Reason for Disposition ? [1] MILD difficulty breathing (e.g., minimal/no SOB at rest, SOB with walking, pulse <100) AND [2] NEW-onset or WORSE than normal ? ?Answer Assessment - Initial Assessment Questions ?1. RESPIRATORY STATUS: "Describe your breathing?" (e.g., wheezing, shortness of breath, unable to speak, severe coughing)  ?    SOB ?2. ONSET: "When did this breathing problem begin?"  ?    5 days ?3. PATTERN "Does the difficult breathing come and go, or has it been constant since it started?"  ?    constant ?4. SEVERITY: "How bad is your breathing?" (e.g., mild, moderate, severe)  ?  - MILD: No SOB at rest, mild SOB with walking, speaks normally in sentences, can lie down, no retractions, pulse < 100.  ?  - MODERATE: SOB at rest, SOB with minimal exertion and prefers to sit, cannot lie down flat, speaks in phrases, mild retractions, audible wheezing, pulse 100-120.  ?  - SEVERE: Very SOB at rest, speaks in single words, struggling to breathe, sitting hunched forward, retractions, pulse > 120  ?    Mild - moderate ?5. RECURRENT SYMPTOM: "Have you had difficulty breathing before?" If Yes, ask: "When was the last time?" and "What happened that time?"  ?    yes ?6. CARDIAC HISTORY: "Do you have any history of heart disease?" (e.g., heart attack, angina, bypass surgery, angioplasty)  ?    no ?7. LUNG HISTORY: "Do you have any history of lung disease?"  (e.g., pulmonary embolus, asthma, emphysema) ?    no ?8. CAUSE: "What do  you think is causing the breathing problem?"  ?    Chest congestion ?9. OTHER SYMPTOMS: "Do you have any other symptoms? (e.g., dizziness, runny nose, cough, chest pain, fever) ?    Runny nose - head congestion ?10. O2 SATURATION MONITOR:  "Do you use an oxygen saturation monitor (pulse oximeter) at home?" If Yes, "What is your reading (oxygen level) today?" "What is your usual oxygen saturation reading?" (e.g., 95%) ?      na ?11. PREGNANCY: "Is there any chance you are pregnant?" "When was your last menstrual period?" ?      na ?12. TRAVEL: "Have you traveled out of the country in the last month?" (e.g., travel history, exposures) ?      na ? ?Protocols used: Breathing Difficulty-A-AH ? ?

## 2021-04-12 NOTE — Telephone Encounter (Signed)
FYI

## 2021-04-12 NOTE — Progress Notes (Signed)
The patient no-showed for appointment despite this provider sending direct link, reaching out via phone with no response and waiting for at least 10 minutes from appointment time for patient to join. They will be marked as a NS for this appointment/time.   Teyton Pattillo M Jago Carton, NP    

## 2021-04-14 ENCOUNTER — Encounter: Payer: Self-pay | Admitting: Family Medicine

## 2021-04-14 ENCOUNTER — Telehealth (INDEPENDENT_AMBULATORY_CARE_PROVIDER_SITE_OTHER): Payer: Self-pay | Admitting: Family Medicine

## 2021-04-14 DIAGNOSIS — J0110 Acute frontal sinusitis, unspecified: Secondary | ICD-10-CM

## 2021-04-14 MED ORDER — BENZONATATE 200 MG PO CAPS: 200.0000 mg | ORAL_CAPSULE | Freq: Two times a day (BID) | ORAL | 0 refills | Status: DC | PRN

## 2021-04-14 MED ORDER — PREDNISONE 20 MG PO TABS: ORAL_TABLET | ORAL | 0 refills | Status: DC

## 2021-04-14 MED ORDER — AMOXICILLIN-POT CLAVULANATE 875-125 MG PO TABS: 1.0000 | ORAL_TABLET | Freq: Two times a day (BID) | ORAL | 0 refills | Status: DC

## 2021-04-14 MED ORDER — FLUTICASONE PROPIONATE 50 MCG/ACT NA SUSP: 2.0000 | Freq: Every day | NASAL | 3 refills | Status: DC

## 2021-04-14 NOTE — Patient Instructions (Signed)
Sinusitis, Adult ?Sinusitis is inflammation of your sinuses. Sinuses are hollow spaces in the bones around your face. Your sinuses are located: ?Around your eyes. ?In the middle of your forehead. ?Behind your nose. ?In your cheekbones. ?Mucus normally drains out of your sinuses. When your nasal tissues become inflamed or swollen, mucus can become trapped or blocked. This allows bacteria, viruses, and fungi to grow, which leads to infection. Most infections of the sinuses are caused by a virus. ?Sinusitis can develop quickly. It can last for up to 4 weeks (acute) or for more than 12 weeks (chronic). Sinusitis often develops after a cold. ?What are the causes? ?This condition is caused by anything that creates swelling in the sinuses or stops mucus from draining. This includes: ?Allergies. ?Asthma. ?Infection from bacteria or viruses. ?Deformities or blockages in your nose or sinuses. ?Abnormal growths in the nose (nasal polyps). ?Pollutants, such as chemicals or irritants in the air. ?Infection from fungi (rare). ?What increases the risk? ?You are more likely to develop this condition if you: ?Have a weak body defense system (immune system). ?Do a lot of swimming or diving. ?Overuse nasal sprays. ?Smoke. ?What are the signs or symptoms? ?The main symptoms of this condition are pain and a feeling of pressure around the affected sinuses. Other symptoms include: ?Stuffy nose or congestion. ?Thick drainage from your nose. ?Swelling and warmth over the affected sinuses. ?Headache. ?Upper toothache. ?A cough that may get worse at night. ?Extra mucus that collects in the throat or the back of the nose (postnasal drip). ?Decreased sense of smell and taste. ?Fatigue. ?A fever. ?Sore throat. ?Bad breath. ?How is this diagnosed? ?This condition is diagnosed based on: ?Your symptoms. ?Your medical history. ?A physical exam. ?Tests to find out if your condition is acute or chronic. This may include: ?Checking your nose for nasal  polyps. ?Viewing your sinuses using a device that has a light (endoscope). ?Testing for allergies or bacteria. ?Imaging tests, such as an MRI or CT scan. ?In rare cases, a bone biopsy may be done to rule out more serious types of fungal sinus disease. ?How is this treated? ?Treatment for sinusitis depends on the cause and whether your condition is chronic or acute. ?If caused by a virus, your symptoms should go away on their own within 10 days. You may be given medicines to relieve symptoms. They include: ?Medicines that shrink swollen nasal passages (topical intranasal decongestants). ?Medicines that treat allergies (antihistamines). ?A spray that eases inflammation of the nostrils (topical intranasal corticosteroids). ?Rinses that help get rid of thick mucus in your nose (nasal saline washes). ?If caused by bacteria, your health care provider may recommend waiting to see if your symptoms improve. Most bacterial infections will get better without antibiotic medicine. You may be given antibiotics if you have: ?A severe infection. ?A weak immune system. ?If caused by narrow nasal passages or nasal polyps, you may need to have surgery. ?Follow these instructions at home: ?Medicines ?Take, use, or apply over-the-counter and prescription medicines only as told by your health care provider. These may include nasal sprays. ?If you were prescribed an antibiotic medicine, take it as told by your health care provider. Do not stop taking the antibiotic even if you start to feel better. ?Hydrate and humidify ? ?Drink enough fluid to keep your urine pale yellow. Staying hydrated will help to thin your mucus. ?Use a cool mist humidifier to keep the humidity level in your home above 50%. ?Inhale steam for 10-15 minutes, 3-4 times  a day, or as told by your health care provider. You can do this in the bathroom while a hot shower is running. ?Limit your exposure to cool or dry air. ?Rest ?Rest as much as possible. ?Sleep with your  head raised (elevated). ?Make sure you get enough sleep each night. ?General instructions ? ?Apply a warm, moist washcloth to your face 3-4 times a day or as told by your health care provider. This will help with discomfort. ?Wash your hands often with soap and water to reduce your exposure to germs. If soap and water are not available, use hand sanitizer. ?Do not smoke. Avoid being around people who are smoking (secondhand smoke). ?Keep all follow-up visits as told by your health care provider. This is important. ?Contact a health care provider if: ?You have a fever. ?Your symptoms get worse. ?Your symptoms do not improve within 10 days. ?Get help right away if: ?You have a severe headache. ?You have persistent vomiting. ?You have severe pain or swelling around your face or eyes. ?You have vision problems. ?You develop confusion. ?Your neck is stiff. ?You have trouble breathing. ?Summary ?Sinusitis is soreness and inflammation of your sinuses. Sinuses are hollow spaces in the bones around your face. ?This condition is caused by nasal tissues that become inflamed or swollen. The swelling traps or blocks the flow of mucus. This allows bacteria, viruses, and fungi to grow, which leads to infection. ?If you were prescribed an antibiotic medicine, take it as told by your health care provider. Do not stop taking the antibiotic even if you start to feel better. ?Keep all follow-up visits as told by your health care provider. This is important. ?This information is not intended to replace advice given to you by your health care provider. Make sure you discuss any questions you have with your health care provider. ?Document Revised: 05/29/2017 Document Reviewed: 05/29/2017 ?Elsevier Patient Education ? [place]. ? ? ?Please schedule a Follow-up Appointment to: No follow-ups on file. ? ?If you have any other questions or concerns, please feel free to call the office or send a message through [place]. You may also  schedule an earlier appointment if necessary. ? ?Additionally, you may be receiving a survey about your experience at our office within a few days to 1 week by e-mail or mail. We value your feedback. ? ?[name], DO ?[facility] ?

## 2021-04-14 NOTE — Progress Notes (Signed)
Virtual Visit via Telephone ?The purpose of this virtual visit is to provide medical care while limiting exposure to the novel coronavirus (COVID19) for both patient and office staff. ? ?Consent was obtained for phone visit:  Yes.   ?Answered questions that patient had about telehealth interaction:  Yes.   ?I discussed the limitations, risks, security and privacy concerns of performing an evaluation and management service by telephone. I also discussed with the patient that there may be a patient responsible charge related to this service. The patient expressed understanding and agreed to proceed. ? ?Patient Location: Home ?Provider Location: [facility] (Office) ? ?Participants in virtual visit: ?- Patient: Shannon Small ?- CMA: Orinda Kenner, CMA ?- Provider: Dr Parks Ranger ? ?---------------------------------------------------------------------- ?Chief Complaint  ?Patient presents with  ? Sinusitis  ? ? ?S: Reviewed CMA documentation. I have called patient and gathered additional HPI as follows: ? ?Sinusitis ?Reports that symptoms started 1-2 weeks ago, now worsening, previous sinus infection improved on amoxicillin. Now admits sinus pain pressure drainage, clearing it out. No fever chills. ?- Tried OTC decongestant cough medicine ? ?Denies any known or suspected exposure to person with or possibly with COVID19. ? ?Admits sinus pain or pressure ?Denies any fevers, chills, sweats, body ache, cough, shortness of breath, headache, abdominal pain, diarrhea ? ?Past Medical History:  ?Diagnosis Date  ? Acid reflux 09/09/2014  ? Adjustment disorder with depressed mood 11/14/2006  ? ASSESSMENT: Bereavement. Recommend counseling with pastor or psychologist.   ? Benign neoplasm of skin 11/14/2006  ? Cephalalgia 02/10/2006  ? Chronic infection of sinus 09/09/2014  ? Extreme obesity 02/10/2006  ? Hypertension   ? LBP (low back pain) 01/15/2007  ? Chronic back strain and spasms due to obesity.   ? 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 ?Social History  ? ?Tobacco Use  ? Smoking status: Never  ? Smokeless tobacco: Never  ?Vaping Use  ? Vaping Use: Never used  ?Substance Use Topics  ? Alcohol use: Yes  ?  Alcohol/week: 0.0 standard drinks  ?  Comment: rarely  ? Drug use: No  ? ? ?Current Outpatient Medications:  ?  fluticasone (FLONASE) 50 MCG/ACT nasal spray, Place 2 sprays into both nostrils daily. Use for 4-6 weeks then stop and use seasonally or as needed., Disp: 16 g, Rfl: 3 ?  acetaminophen (TYLENOL) 500 MG tablet, Take 1,000 mg by mouth every 6 (six) hours as needed for moderate pain or headache. (Patient not taking: Reported on 10/09/2020), Disp: , Rfl:  ?  amoxicillin-clavulanate (AUGMENTIN) 875-125 MG tablet, Take 1 tablet by mouth 2 (two) times daily., Disp: 20 tablet, Rfl: 0 ?  aspirin EC 81 MG tablet, Take 1 tablet (81 mg total) by mouth daily. Swallow whole., Disp: 90 tablet, Rfl: 3 ?  benzonatate (TESSALON) 200 MG capsule, Take 1 capsule (200 mg total) by mouth 2 (two) times daily as needed for cough., Disp: 30 capsule, Rfl: 0 ?  clotrimazole-betamethasone (LOTRISONE) cream, APPLY TOPICALLY TO GENITAL AREA FOR VAGINAL ITCHING AND IRRITATION FOR UP TO 2 WEEKS, THEN STOP, Disp: 45 g, Rfl: 3 ?  ezetimibe (ZETIA) 10 MG tablet, Take 1 tablet (10 mg total) by mouth daily., Disp: 30 tablet, Rfl: 3 ?  furosemide (LASIX) 40 MG tablet, Take 1 tablet (40 mg total) by mouth daily., Disp: 30 tablet, Rfl: 3 ?  guaiFENesin-codeine (ROBITUSSIN AC) 100-10 MG/5ML syrup, Take 5-10 mLs by mouth 4 (four) times daily as needed for cough., Disp: 200 mL, Rfl:  0 ?  hydrocortisone 2.5 % cream, APPLY  CREAM TO AFFECTED AREA TWICE DAILY AS NEEDED ECZEMA, Disp: 28 g, Rfl: 2 ?  hydrOXYzine (ATARAX/VISTARIL) 25 MG tablet, Take 1 tablet (25 mg total) by mouth 3 (three) times daily as needed for itching., Disp: 60 tablet, Rfl: 2 ?  ibuprofen (ADVIL) 800 MG tablet, TAKE 1 TABLET BY MOUTH EVERY 8 HOURS AS NEEDED FOR MODERATE PAIN, Disp: 90 tablet,  Rfl: 2 ?  ketoconazole (NIZORAL) 2 % cream, Apply 1 application topically daily as needed for irritation., Disp: 30 g, Rfl: 2 ?  lisinopril (ZESTRIL) 40 MG tablet, Take 1 tablet (40 mg total) by mouth daily., Disp: 90 tablet, Rfl: 3 ?  metoprolol succinate (TOPROL XL) 25 MG 24 hr tablet, Take 1 tablet (25 mg total) by mouth daily., Disp: 30 tablet, Rfl: 0 ?  orphenadrine (NORFLEX) 100 MG tablet, TAKE 1 TABLET BY MOUTH TWICE DAILY AS NEEDED FOR MUSCLE SPASM, Disp: 60 tablet, Rfl: 2 ?  predniSONE (DELTASONE) 20 MG tablet, Take daily with food. Start with '60mg'$  (3 pills) x 2 days, then reduce to '40mg'$  (2 pills) x 2 days, then '20mg'$  (1 pill) x 3 days, Disp: 13 tablet, Rfl: 0 ?  spironolactone (ALDACTONE) 25 MG tablet, Take 1 tablet (25 mg total) by mouth daily., Disp: 90 tablet, Rfl: 3 ?  triamcinolone cream (KENALOG) 0.5 %, APPLY CREAM TOPICALLY TO AFFECTED AREA TWICE DAILY FOR UP TO 2 WEEKS, Disp: 30 g, Rfl: 2 ? ?Current Facility-Administered Medications:  ?  sodium chloride flush (NS) 0.9 % injection 3 mL, 3 mL, Intravenous, Q12H, Kate Sable, MD ? ? ?  12/16/2019  ?  2:25 PM 03/05/2019  ?  9:24 AM 01/28/2019  ? 10:10 AM  ?Depression screen PHQ 2/9  ?Decreased Interest 0 0 0  ?Down, Depressed, Hopeless 1 0 0  ?PHQ - 2 Score 1 0 0  ?Altered sleeping 1 0 0  ?Tired, decreased energy 3 0 0  ?Change in appetite 2 0 0  ?Feeling bad or failure about yourself  1 0 0  ?Trouble concentrating 0 0 0  ?Moving slowly or fidgety/restless 0 0 0  ?Suicidal thoughts 0 0 0  ?PHQ-9 Score 8 0 0  ?Difficult doing work/chores Somewhat difficult Not difficult at all Not difficult at all  ? ? ? ?  12/16/2019  ?  2:25 PM 03/05/2019  ?  9:26 AM  ?GAD 7 : Generalized Anxiety Score  ?Nervous, Anxious, on Edge 1 0  ?Control/stop worrying 3 0  ?Worry too much - different things 2 0  ?Trouble relaxing 2 0  ?Restless 1 0  ?Easily annoyed or irritable 2 0  ?Afraid - awful might happen 0 0  ?Total GAD 7 Score 11 0  ?Anxiety Difficulty Somewhat  difficult Not difficult at all  ? ? ?-------------------------------------------------------------------------- ?O: No physical exam performed due to remote telephone encounter. ? ?Lab results reviewed. ? ?No results found for this or any previous visit (from the past 2160 hour(s)). ? ?-------------------------------------------------------------------------- ?A&P: ? ?Problem List Items Addressed This Visit   ?None ?Visit Diagnoses   ? ? Acute non-recurrent frontal sinusitis      ? Relevant Medications  ? amoxicillin-clavulanate (AUGMENTIN) 875-125 MG tablet  ? predniSONE (DELTASONE) 20 MG tablet  ? benzonatate (TESSALON) 200 MG capsule  ? fluticasone (FLONASE) 50 MCG/ACT nasal spray  ? ?  ? ?Consistent with subacute frontal sinusitis, likely initially viral URI vs allergic rhinitis component with worsening concern for bacterial infection.  ? ?  Plan: ?1.  Start Augmentin 875-'125mg'$  PO BID x 10 days ?2. Start nasal steroid Flonase 2 sprays in each nostril daily for 4-6 weeks, may repeat course seasonally or as needed ?3. Start Tessalon Perls take 1 capsule up to 3 times a day as needed for cough ? ?4. Prednisone taper if not improving for breathing / pressure ? ?Return criteria reviewed ? ? ?Meds ordered this encounter  ?Medications  ? amoxicillin-clavulanate (AUGMENTIN) 875-125 MG tablet  ?  Sig: Take 1 tablet by mouth 2 (two) times daily.  ?  Dispense:  20 tablet  ?  Refill:  0  ? predniSONE (DELTASONE) 20 MG tablet  ?  Sig: Take daily with food. Start with '60mg'$  (3 pills) x 2 days, then reduce to '40mg'$  (2 pills) x 2 days, then '20mg'$  (1 pill) x 3 days  ?  Dispense:  13 tablet  ?  Refill:  0  ? benzonatate (TESSALON) 200 MG capsule  ?  Sig: Take 1 capsule (200 mg total) by mouth 2 (two) times daily as needed for cough.  ?  Dispense:  30 capsule  ?  Refill:  0  ? fluticasone (FLONASE) 50 MCG/ACT nasal spray  ?  Sig: Place 2 sprays into both nostrils daily. Use for 4-6 weeks then stop and use seasonally or as needed.  ?   Dispense:  16 g  ?  Refill:  3  ? ? ?Follow-up: ?- Return as needed ? ?Patient verbalizes understanding with the above medical recommendations including the limitation of remote medical advice. ? ?Specific follow

## 2021-04-21 ENCOUNTER — Other Ambulatory Visit: Payer: Self-pay | Admitting: Family Medicine

## 2021-04-21 DIAGNOSIS — J9801 Acute bronchospasm: Secondary | ICD-10-CM

## 2021-04-21 NOTE — Telephone Encounter (Signed)
Copied from [place] [id number]. Topic: Quick Communication - Rx Refill/Question ?>> Apr 21, 2021 10:01 AM Leward Quan A wrote: ?Medication: albuterol (PROAIR HFA) 108 (90 BASE) MCG/ACT inhaler  ?Per patient prednisone not doing enough  ?Has the patient contacted their pharmacy? No. Have not had in a while ?(Agent: If no, request that the patient contact the pharmacy for the refill. If patient does not wish to contact the pharmacy document the reason why and proceed with request.) ?(Agent: If yes, when and what did the pharmacy advise?) ? ?Preferred Pharmacy (with phone number or street name): [facility] (N[facility] [place] [address]  ?Phone:  [phone] ?Fax:  [phone] ? ? ? ?Has the patient been seen for an appointment in the last year OR does the patient have an upcoming appointment? Yes.   ? ?Agent: Please be advised that RX refills may take up to 3 business days. We ask that you follow-up with your pharmacy. ?

## 2021-04-21 NOTE — Telephone Encounter (Signed)
Requested medication (s) are due for refill today: yes ? ?Requested medication (s) are on the active medication list: no ? ?Last refill:   ? ?Future visit scheduled: no ? ?Notes to clinic:  rx was dc'd on 11/06/19 by provider. Please advise ? ? ?  ?Requested Prescriptions  ?Pending Prescriptions Disp Refills  ? albuterol (VENTOLIN HFA) 108 (90 Base) MCG/ACT inhaler 6.7 g 2  ?  Sig: Inhale 2 puffs into the lungs every 4 (four) hours as needed for wheezing or shortness of breath (cough).  ?  ? Pulmonology:  Beta Agonists 2 Failed - 04/21/2021 11:06 AM  ?  ?  Failed - Last BP in normal range  ?  BP Readings from Last 1 Encounters:  ?12/14/20 (!) 183/87  ?  ?  ?  ?  Passed - Last Heart Rate in normal range  ?  Pulse Readings from Last 1 Encounters:  ?12/14/20 74  ?  ?  ?  ?  Passed - Valid encounter within last 12 months  ?  Recent Outpatient Visits   ? ?      ? 1 week ago Acute non-recurrent frontal sinusitis  ? [place], DO  ? 1 month ago Acute non-recurrent frontal sinusitis  ? [place], DO  ? 6 months ago Acute non-recurrent frontal sinusitis  ? [facility] [place], [place] W, NP  ? 1 year ago Abrasion of fifth toe of left foot, initial encounter  ? [place], DO  ? 1 year ago Acute non-recurrent frontal sinusitis  ? [place], DO  ? ?  ?  ? ?  ?  ?  ? ?

## 2021-04-22 MED ORDER — ALBUTEROL SULFATE HFA 108 (90 BASE) MCG/ACT IN AERS: 2.0000 | INHALATION_SPRAY | RESPIRATORY_TRACT | 2 refills | Status: DC | PRN

## 2021-04-22 NOTE — Telephone Encounter (Signed)
Requested medication (s) are due for refill today:  Yes ? ?Requested medication (s) are on the active medication list:   Yes ? ?Future visit scheduled:   No ? ? ?Last ordered: 02/27/2021 #30, 0 refills ? ?Returned because needs an appt.   No valid encounter within 6 months per protocol.  Attempted to call however there isn't a voice mailbox set up so unable to leave a message.      ? ?Requested Prescriptions  ?Pending Prescriptions Disp Refills  ? metoprolol succinate (TOPROL-XL) 25 MG 24 hr tablet [Pharmacy Med Name: Metoprolol Succinate ER 25 MG Oral Tablet Extended Release 24 Hour] 30 tablet 0  ?  Sig: TAKE 1 TABLET BY MOUTH ONCE DAILY. MUST SCHEDULE OFFICE VISIT FOR FURTHER REFILLS.  ?  ? Cardiovascular:  Beta Blockers Failed - 04/21/2021  3:02 PM  ?  ?  Failed - Last BP in normal range  ?  BP Readings from Last 1 Encounters:  ?12/14/20 (!) 183/87  ?  ?  ?  ?  Failed - Valid encounter within last 6 months  ?  Recent Outpatient Visits   ? ?      ? 1 week ago Acute non-recurrent frontal sinusitis  ? [place], DO  ? 1 month ago Acute non-recurrent frontal sinusitis  ? [place], DO  ? 6 months ago Acute non-recurrent frontal sinusitis  ? [facility] [place], [place] W, NP  ? 1 year ago Abrasion of fifth toe of left foot, initial encounter  ? [place], DO  ? 1 year ago Acute non-recurrent frontal sinusitis  ? [place], DO  ? ?  ?  ? ?  ?  ?  Passed - Last Heart Rate in normal range  ?  Pulse Readings from Last 1 Encounters:  ?12/14/20 74  ?  ?  ?  ?  ? ?

## 2021-05-20 ENCOUNTER — Other Ambulatory Visit: Payer: Self-pay | Admitting: Family Medicine

## 2021-05-20 DIAGNOSIS — I10 Essential (primary) hypertension: Secondary | ICD-10-CM

## 2021-05-20 MED ORDER — LISINOPRIL 40 MG PO TABS: 40.0000 mg | ORAL_TABLET | Freq: Every day | ORAL | 1 refills | Status: DC

## 2021-05-20 MED ORDER — METOPROLOL SUCCINATE ER 25 MG PO TB24: 25.0000 mg | ORAL_TABLET | Freq: Every day | ORAL | 1 refills | Status: DC

## 2021-05-20 MED ORDER — SPIRONOLACTONE 25 MG PO TABS: 25.0000 mg | ORAL_TABLET | Freq: Every day | ORAL | 1 refills | Status: DC

## 2021-05-20 NOTE — Telephone Encounter (Signed)
Requested medication (s) are due for refill today: yes ? ?Requested medication (s) are on the active medication list: yes   ? ?Last refill: Lisinopril 04/16/20 #90  3 refills  Historical provider   Metoprolol 04/22/21  #30  0 refills   Spironolactone   04/16/21 #30 0 refill                 Historical Provider ? ?Future visit scheduled No ? ?Notes to clinic: Patient called to get clarity on the medications she's needing. She looked at the bottles and says Metoprolol, Lisinopril, and Spironolactone. I advised 2 were last refilled by the cardiologist, she says she's not going to him anymore due to no money to pay for the visits, so she would like Dr. Parks Ranger to refill for 3 months, it's cheaper for her to pay every 3 months. She also says she doesn't have any money to come to see Dr. Parks Ranger right now, she has a lot going on with her mom in rehab and she really needs to be on these medicines. I advised I will send to him for refills. ? ?Requested Prescriptions  ?Pending Prescriptions Disp Refills  ? lisinopril (ZESTRIL) 40 MG tablet 90 tablet 3  ?  Sig: Take 1 tablet (40 mg total) by mouth daily.  ?  ? Cardiovascular:  ACE Inhibitors Failed - 05/20/2021 11:58 AM  ?  ?  Failed - Last BP in normal range  ?  BP Readings from Last 1 Encounters:  ?12/14/20 (!) 183/87  ?   ?  ?  Failed - Valid encounter within last 6 months  ?  Recent Outpatient Visits   ? ?      ? 1 month ago Acute non-recurrent frontal sinusitis  ? [place], DO  ? 2 months ago Acute non-recurrent frontal sinusitis  ? [place], DO  ? 7 months ago Acute non-recurrent frontal sinusitis  ? [facility] [place], [place] W, NP  ? 1 year ago Abrasion of fifth toe of left foot, initial encounter  ? [place], DO  ? 1 year ago Acute non-recurrent frontal sinusitis  ? [place], DO   ? ?  ?  ? ? ?  ?  ?  Passed - Cr in normal range and within 180 days  ?  Creat  ?Date Value Ref Range Status  ?03/05/2019 0.63 0.50 - 1.05 mg/dL Final  ?  Comment:  ?  For patients >8 years of age, the reference limit ?for Creatinine is approximately 13% higher for people ?identified as African-American. ?. ?  ? ?Creatinine, Ser  ?Date Value Ref Range Status  ?12/14/2020 0.66 0.44 - 1.00 mg/dL Final  ?   ?  ?  Passed - K in normal range and within 180 days  ?  Potassium  ?Date Value Ref Range Status  ?12/14/2020 4.2 3.5 - 5.1 mmol/L Final  ?07/30/2013 3.7 3.5 - 5.1 mmol/L Final  ?   ?  ?  Passed - Patient is not pregnant  ?  ?  ? metoprolol succinate (TOPROL-XL) 25 MG 24 hr tablet 30 tablet 0  ?  ? Cardiovascular:  Beta Blockers Failed - 05/20/2021 11:58 AM  ?  ?  Failed - Last BP in normal range  ?  BP Readings from Last 1 Encounters:  ?12/14/20 (!) 183/87  ?   ?  ?  Failed - Valid encounter within last 6 months  ?  Recent Outpatient Visits   ? ?      ? 1 month ago Acute non-recurrent frontal sinusitis  ? [place], DO  ? 2 months ago Acute non-recurrent frontal sinusitis  ? [place], DO  ? 7 months ago Acute non-recurrent frontal sinusitis  ? [facility] [place], [place] W, NP  ? 1 year ago Abrasion of fifth toe of left foot, initial encounter  ? [place], DO  ? 1 year ago Acute non-recurrent frontal sinusitis  ? [place], DO  ? ?  ?  ? ? ?  ?  ?  Passed - Last Heart Rate in normal range  ?  Pulse Readings from Last 1 Encounters:  ?12/14/20 74  ?   ?  ?  ? spironolactone (ALDACTONE) 25 MG tablet 90 tablet 3  ?  Sig: Take 1 tablet (25 mg total) by mouth daily.  ?  ? Cardiovascular: Diuretics - Aldosterone Antagonist Failed - 05/20/2021 11:58 AM  ?  ?  Failed - Last BP in normal range  ?  BP Readings from Last 1 Encounters:   ?12/14/20 (!) 183/87  ?   ?  ?  Failed - Valid encounter within last 6 months  ?  Recent Outpatient Visits   ? ?      ? 1 month ago Acute non-recurrent frontal sinusitis  ? [place], DO  ? 2 months ago Acute non-recurrent frontal sinusitis  ? [place], DO  ? 7 months ago Acute non-recurrent frontal sinusitis  ? [facility] [place], [place] W, NP  ? 1 year ago Abrasion of fifth toe of left foot, initial encounter  ? [place], DO  ? 1 year ago Acute non-recurrent frontal sinusitis  ? [place], DO  ? ?  ?  ? ? ?  ?  ?  Passed - Cr in normal range and within 180 days  ?  Creat  ?Date Value Ref Range Status  ?03/05/2019 0.63 0.50 - 1.05 mg/dL Final  ?  Comment:  ?  For patients >63 years of age, the reference limit ?for Creatinine is approximately 13% higher for people ?identified as African-American. ?. ?  ? ?Creatinine, Ser  ?Date Value Ref Range Status  ?12/14/2020 0.66 0.44 - 1.00 mg/dL Final  ?   ?  ?  Passed - K in normal range and within 180 days  ?  Potassium  ?Date Value Ref Range Status  ?12/14/2020 4.2 3.5 - 5.1 mmol/L Final  ?07/30/2013 3.7 3.5 - 5.1 mmol/L Final  ?   ?  ?  Passed - Na in normal range and within 180 days  ?  Sodium  ?Date Value Ref Range Status  ?12/14/2020 137 135 - 145 mmol/L Final  ?02/11/2020 137 134 - 144 mmol/L Final  ?07/30/2013 140 136 - 145 mmol/L Final  ?   ?  ?  Passed - eGFR is 30 or above and within 180 days  ?  GFR, Est African American  ?Date Value Ref Range Status  ?03/05/2019 115 > OR = 60 mL/min/1.78m Final  ? ?GFR calc Af Amer  ?Date Value Ref Range Status  ?02/11/2020 103 >59 mL/min/1.73 Final  ?  Comment:  ?  **In accordance with recommendations from the NKF-ASN Task force,** ?  [facility] is in the process of updating its eGFR calculation to the ?  2021 CKD-EPI creatinine equation that  estimates kidney function ?  without a race variable. ?  ? ?GFR, Est Non African American  ?Date Value Ref Range Status  ?03/05/2019 99 > OR = 60 mL/min/1.63m Final  ? ?GFR, Estimated  ?Date Value Ref Range Status  ?12/14/2020 >60 >60 mL/min Final  ?  Comment:  ?  (NOTE) ?Calculated using the CKD-EPI Creatinine Equation (2021) ?  ?   ?  ?  ? ? ? ? ?

## 2021-05-20 NOTE — Telephone Encounter (Signed)
Patient called to get clarity on the medications she's needing. She looked at the bottles and says Metoprolol, Lisinopril, and Spironolactone. I advised 2 were last refilled by the cardiologist, she says she's not going to him anymore due to no money to pay for the visits, so she would like Shannon Small to refill for 3 months, it's cheaper for her to pay every 3 months. She also says she doesn't have any money to come to see Shannon Small right now, she has a lot going on with her mom in rehab and she really needs to be on these medicines. I advised I will send to him for refills. ?

## 2021-05-20 NOTE — Telephone Encounter (Signed)
Pt stated she needs four of her bp medications refilled. Pt refused to provide names and stated she did not know the names of the medications.  ? ?Pt seeking clinical advice.  ?

## 2021-09-27 ENCOUNTER — Encounter: Payer: Self-pay | Admitting: Physician Assistant

## 2021-10-05 ENCOUNTER — Ambulatory Visit: Payer: Self-pay | Attending: Nurse Practitioner | Admitting: Nurse Practitioner

## 2021-10-05 ENCOUNTER — Encounter: Payer: Self-pay | Admitting: Nurse Practitioner

## 2021-10-05 VITALS — BP 138/78 | HR 80 | Ht 65.0 in | Wt 324.4 lb

## 2021-10-05 DIAGNOSIS — I428 Other cardiomyopathies: Secondary | ICD-10-CM

## 2021-10-05 DIAGNOSIS — I10 Essential (primary) hypertension: Secondary | ICD-10-CM

## 2021-10-05 DIAGNOSIS — I5020 Unspecified systolic (congestive) heart failure: Secondary | ICD-10-CM

## 2021-10-05 DIAGNOSIS — E782 Mixed hyperlipidemia: Secondary | ICD-10-CM

## 2021-10-05 NOTE — Progress Notes (Signed)
Office Visit    Patient Name: Shannon Small Date of Encounter: 10/05/2021  Primary Care Provider:  Olin Hauser, DO Primary Cardiologist:  Kate Sable, MD  Chief Complaint    61 year old female with a history of chronic HFrEF, nonischemic cardiomyopathy, nonobstructive CAD, hypertension, hyperlipidemia, obesity, and GERD, who presents for follow-up with HFrEF/nonischemic cardiomyopathy.  Past Medical History    Past Medical History:  Diagnosis Date   Acid reflux 09/09/2014   Adjustment disorder with depressed mood 11/14/2006   ASSESSMENT: Bereavement. Recommend counseling with pastor or psychologist.    Benign neoplasm of skin 11/14/2006   Cephalalgia 02/10/2006   Chronic HFrEF (heart failure with reduced ejection fraction) ([place])    a. 01/2020 Echo: EF 30-35%.   Chronic infection of sinus 09/09/2014   Extreme obesity 02/10/2006   Hypertension    LBP (low back pain) 01/15/2007   Chronic back strain and spasms due to obesity.    Mixed hyperlipidemia    NICM (nonischemic cardiomyopathy) ([place])    a. 11/2019 MV: EF 29%; b. 01/2020 Echo: EF 30-35%, glob HK, mild LVH, GrI DD, nl RV fxn, mildly dil LA, mod MR; b. 01/2020 Cath nonobs dzs.   Nonobstructive CAD (coronary artery disease)    a. 11/2019 MV: small apical defect - likely breast atten. EF 29%; b. 02/2020 Cath: LM nl, LADnl, LCX nl, OM1/2/3 nl, RCA 20p, RPDA/RPAV nl. EF 25-35%.   Sleep related leg cramps 09/09/2014   Past Surgical History:  Procedure Laterality Date   ABDOMINAL HYSTERECTOMY     RHINOPLASTY  1982   RIGHT/LEFT HEART CATH AND CORONARY ANGIOGRAPHY N/A 02/17/2020   Procedure: RIGHT/LEFT HEART CATH AND CORONARY ANGIOGRAPHY;  Surgeon: Wellington Hampshire, MD;  Location: [facility];  Service: Cardiovascular;  Laterality: N/A;    Allergies  Allergies  Allergen Reactions   Atorvastatin Other (See Comments)    Myalgias    History of Present Illness    61 year old female with the  above past medical history including chronic HFrEF, nonischemic cardiomyopathy, nonobstructive CAD, hypertension, hyperlipidemia, obesity, and GERD.  She was previously evaluated with stress testing in the setting of chest discomfort in November 2021, which showed no ischemia but did show an EF of 29%.  This was followed by echocardiogram, which corroborated the Myoview, showing an EF of 30 to 35% with global hypokinesis.  She was subsequently evaluated in clinic underwent diagnostic catheterization which showed mild, nonobstructive RCA disease and otherwise normal coronary arteries with an EF of 25 to 35% by ventriculography.  She was initially placed on Toprol, Entresto, Jardiance, and Lasix however, she could not afford Entresto and Jardiance and was instead placed on lisinopril and spironolactone.  Consideration was given for cardiac MRI to rule out infiltrative etiology however, her body habitus is not suitable for MRI.  Shannon Small was last seen in cardiology clinic in April 2022, at which time last prolonged spironolactone was titrated secondary to elevated blood pressure.  She has since had several cancellations.  She notes that she has been feeling well over the past 17 months.  She does not experience chest pain and activity is limited secondary to bilateral knee pain.  With her limited activity, she does not experience dyspnea.  She has chronic, mild right greater than left lower extremity swelling.  She lost her job earlier this year and has been having a lot of stress.  She is on disability and receiving some Medicaid benefits.  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  She denies palpitations, PND, orthopnea, dizziness, syncope, or early satiety.  Home Medications    Prior to Admission medications   Medication Sig Start Date End Date Taking? Authorizing Provider  acetaminophen (TYLENOL) 500 MG tablet Take 1,000  mg by mouth every 6 (six) hours as needed for moderate pain or headache.   Yes [provider]  albuterol (VENTOLIN HFA) 108 (90 Base) MCG/ACT inhaler Inhale 2 puffs into the lungs every 4 (four) hours as needed for wheezing or shortness of breath (cough). 04/22/21  Yes Karamalegos, Devonne Doughty, DO  amoxicillin-clavulanate (AUGMENTIN) 875-125 MG tablet Take 1 tablet by mouth 2 (two) times daily. 04/14/21  Yes Karamalegos, Devonne Doughty, DO  aspirin EC 81 MG tablet Take 1 tablet (81 mg total) by mouth daily. Swallow whole. 04/16/20  Yes Agbor-Etang, Aaron Edelman, MD  benzonatate (TESSALON) 200 MG capsule Take 1 capsule (200 mg total) by mouth 2 (two) times daily as needed for cough. 04/14/21  Yes Karamalegos, Devonne Doughty, DO  clotrimazole-betamethasone (LOTRISONE) cream APPLY TOPICALLY TO GENITAL AREA FOR VAGINAL ITCHING AND IRRITATION FOR UP TO 2 WEEKS, THEN STOP 04/10/19  Yes Karamalegos, Alexander J, DO  fluticasone (FLONASE) 50 MCG/ACT nasal spray Place 2 sprays into both nostrils daily. Use for 4-6 weeks then stop and use seasonally or as needed. 04/14/21  Yes Karamalegos, Devonne Doughty, DO  furosemide (LASIX) 40 MG tablet Take 1 tablet (40 mg total) by mouth daily. 02/17/20  Yes Wellington Hampshire, MD  guaiFENesin-codeine (ROBITUSSIN AC) 100-10 MG/5ML syrup Take 5-10 mLs by mouth 4 (four) times daily as needed for cough. 12/18/20  Yes Karamalegos, Devonne Doughty, DO  hydrOXYzine (ATARAX/VISTARIL) 25 MG tablet Take 1 tablet (25 mg total) by mouth 3 (three) times daily as needed for itching. 08/02/19  Yes Karamalegos, Alexander J, DO  ibuprofen (ADVIL) 800 MG tablet TAKE 1 TABLET BY MOUTH EVERY 8 HOURS AS NEEDED FOR MODERATE PAIN 11/21/20  Yes Karamalegos, Devonne Doughty, DO  ketoconazole (NIZORAL) 2 % cream Apply 1 application topically daily as needed for irritation. 03/10/20  Yes Karamalegos, Devonne Doughty, DO  lisinopril (ZESTRIL) 40 MG tablet Take 1 tablet (40 mg total) by mouth daily. 05/20/21  Yes Karamalegos, Devonne Doughty, DO  metoprolol succinate (TOPROL-XL) 25 MG 24 hr tablet Take 1 tablet (25 mg total) by mouth daily. 05/20/21  Yes Karamalegos, Devonne Doughty, DO  orphenadrine (NORFLEX) 100 MG tablet TAKE 1 TABLET BY MOUTH TWICE DAILY AS NEEDED FOR MUSCLE SPASM 04/05/21  Yes Karamalegos, Devonne Doughty, DO  spironolactone (ALDACTONE) 25 MG tablet Take 1 tablet (25 mg total) by mouth daily. 05/20/21  Yes Karamalegos, Devonne Doughty, DO  triamcinolone cream (KENALOG) 0.5 % APPLY CREAM TOPICALLY TO AFFECTED AREA TWICE DAILY FOR UP TO 2 WEEKS 03/10/20  Yes Karamalegos, Devonne Doughty, DO  ezetimibe (ZETIA) 10 MG tablet Take 1 tablet (10 mg total) by mouth daily. 04/20/20 07/19/20  Kate Sable, MD  hydrocortisone 2.5 % cream APPLY  CREAM TO AFFECTED AREA TWICE DAILY AS NEEDED ECZEMA Patient not taking: Reported on 10/05/2021 02/07/18   Olin Hauser, DO  predniSONE (DELTASONE) 20 MG tablet Take daily with food. Start with '60mg'$  (3 pills) x 2 days, then reduce to '40mg'$  (2 pills) x 2 days, then '20mg'$  (1 pill) x 3 days Patient not taking: Reported on 10/05/2021 04/14/21   Olin Hauser, DO  Review of Systems    Some degree of chronic, mild lower extremity edema.  She denies chest pain, dyspnea, palpitations, PND, orthopnea, dizziness, syncope, or early satiety.  All other systems reviewed and are otherwise negative except as noted above.    Physical Exam    VS:  BP 138/78 (BP Location: Right Arm, Patient Position: Sitting, Cuff Size: Normal)   Pulse 80   Ht '5\' 5"'$  (1.651 m)   Wt (!) 324 lb 6.4 oz (147.1 kg)   SpO2 97%   BMI 53.98 kg/m  , BMI Body mass index is 53.98 kg/m.     GEN: Morbidly obese, in no acute distress. HEENT: normal. Neck: Supple, no JVD, carotid bruits, or masses. Cardiac: RRR, + S4.  No murmurs. No clubbing, cyanosis, trace bilateral ankle edema.  Radials/PT 2+ and equal bilaterally.  Respiratory:  Respirations regular and unlabored, clear to auscultation bilaterally. GI: Obese,  soft, nontender, nondistended, BS + x 4. MS: no deformity or atrophy. Skin: warm and dry, no rash. Neuro:  Strength and sensation are intact. Psych: Normal affect.  Accessory Clinical Findings    ECG personally reviewed by me today -regular sinus rhythm, 80, LVH- no acute changes.  Lab Results  Component Value Date   WBC 8.4 12/14/2020   HGB 12.1 12/14/2020   HCT 37.6 12/14/2020   MCV 90.4 12/14/2020   PLT 245 12/14/2020   Lab Results  Component Value Date   CREATININE 0.66 12/14/2020   BUN 17 12/14/2020   NA 137 12/14/2020   K 4.2 12/14/2020   CL 108 12/14/2020   CO2 25 12/14/2020   Lab Results  Component Value Date   ALT 18 12/14/2020   AST 18 12/14/2020   ALKPHOS 68 12/14/2020   BILITOT 0.6 12/14/2020   Lab Results  Component Value Date   CHOL 202 (H) 03/05/2019   HDL 74 03/05/2019   LDLCALC 111 (H) 03/05/2019   TRIG 83 03/05/2019   CHOLHDL 2.7 03/05/2019    Lab Results  Component Value Date   HGBA1C 5.3 03/05/2019    Assessment & Plan    1.  Chronic heart failure with reduced ejection fraction/nonischemic cardiomyopathy: EF 30 to 35% by echo in January 2022 with catheterization in February 2022 showing nonobstructive RCA disease and an EF of 25 to 35% by LV gram.  She has not been able to afford Entresto or Jardiance and is on beta-blocker, ACE inhibitor, Lasix, and spironolactone.  She notes that she has been feeling well at home without chest pain or dyspnea but also notes that activity has been limited.  On exam today, she has trace ankle edema but otherwise appears euvolemic.  Blood pressure initially elevated but on repeat, she was 138/78 and says that she typically runs lower at home.  I recommended follow-up basic metabolic panel today as it has been since December since she has had labs however, she is not sure that she can afford it and is looking to see if she can get a more advanced Medicaid plan.  I also advised that she needs a follow-up  echocardiogram now that she is on maximally tolerated guideline medical therapy.  I will order this and she will wait to see if her insurance will cover it.  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  2.  Essential hypertension: As above, blood pressure initially 176/75 but on repeat, she was 138/78 and  she says she typically runs between 120 and 130 at home.  Continue current regimen.  She will continue to follow at home.  May need to consider switching Toprol-XL to carvedilol.  3.  Hyperlipidemia: LDL of 111 in 2021 with normal LFTs in December 2022.  We will arrange for follow-up lipids as she says she is no longer taking Zetia.  Previously statin intolerant.  She wants to hold off on resuming Zetia until she sees what her lipids look like.  4.  Morbid obesity: Patient's weight is up 22 pounds since February, but still down compared to weights in 2016 through 19.  Activity is limited due to bilateral knee pain.  She would likely benefit from a GLP-1 agonist but notes that she is not able to afford.  5.  Disposition: We will order a follow-up complete metabolic panel, lipids, and echo, and patient will schedule at her convenience pending insurance approval.   Murray Hodgkins, NP 10/05/2021, 2:26 PM

## 2021-10-05 NOTE — Patient Instructions (Signed)
Medication Instructions:  Your physician recommends that you continue on your current medications as directed. Please refer to the Current Medication list given to you today.  *If you need a refill on your cardiac medications before your next appointment, please call your pharmacy*   Lab Work: CMET & Lipid. Go to the following location:   No appointment is needed.  Medical Mall Entrance at [facility] 1st desk on the right to check in (REGISTRATION)  Lab hours: Monday- Friday (7:30 am- 5:30 pm)  If you have labs (blood work) drawn today and your tests are completely normal, you will receive your results only by: [place] (if you have MyChart) OR A paper copy in the mail If you have any lab test that is abnormal or we need to change your treatment, we will call you to review the results.   Testing/Procedures: Your physician has requested that you have an echocardiogram. Echocardiography is a painless test that uses sound waves to create images of your heart. It provides your doctor with information about the size and shape of your heart and how well your heart's chambers and valves are working. This procedure takes approximately one hour. There are no restrictions for this procedure.    Follow-Up: At [facility], you and your health needs are our priority.  As part of our continuing mission to provide you with exceptional heart care, we have created designated Provider Care Teams.  These Care Teams include your primary Cardiologist (physician) and Advanced Practice Providers (APPs -  Physician Assistants and Nurse Practitioners) who all work together to provide you with the care you need, when you need it.  Your next appointment:   4 month(s)  The format for your next appointment:   In Person  Provider:   Kate Sable, MD        Important Information About Sugar

## 2021-10-06 ENCOUNTER — Telehealth: Payer: Self-pay | Admitting: Cardiology

## 2021-10-06 NOTE — Telephone Encounter (Signed)
Patient did not want to schedule 4 month f/u from 10/05/21 visit. States she cannot afford to come back. States she will call back when she is able to schedule.

## 2021-10-08 ENCOUNTER — Telehealth: Payer: Self-pay | Admitting: *Deleted

## 2021-10-08 ENCOUNTER — Other Ambulatory Visit
Admission: RE | Admit: 2021-10-08 | Discharge: 2021-10-08 | Disposition: A | Discharge status: Home / Self Care | Payer: Self-pay | Attending: Nurse Practitioner | Admitting: Nurse Practitioner

## 2021-10-08 DIAGNOSIS — I5020 Unspecified systolic (congestive) heart failure: Secondary | ICD-10-CM | POA: Insufficient documentation

## 2021-10-08 DIAGNOSIS — I2510 Atherosclerotic heart disease of native coronary artery without angina pectoris: Secondary | ICD-10-CM | POA: Insufficient documentation

## 2021-10-08 DIAGNOSIS — I428 Other cardiomyopathies: Secondary | ICD-10-CM

## 2021-10-08 DIAGNOSIS — E782 Mixed hyperlipidemia: Secondary | ICD-10-CM

## 2021-10-08 LAB — LIPID PANEL
Cholesterol: 202 mg/dL — ABNORMAL HIGH (ref 0–200)
HDL: 69 mg/dL (ref >40)
LDL Cholesterol: 119 mg/dL — ABNORMAL HIGH (ref 0–99)
Total CHOL/HDL Ratio: 2.9 RATIO
Triglycerides: 68 mg/dL (ref <150)
VLDL: 14 mg/dL (ref 0–40)

## 2021-10-08 LAB — COMPREHENSIVE METABOLIC PANEL
ALT: 14 U/L (ref 0–44)
AST: 16 U/L (ref 15–41)
Albumin: 4.1 g/dL (ref 3.5–5.0)
Alkaline Phosphatase: 84 U/L (ref 38–126)
Anion gap: 6 (ref 5–15)
BUN: 22 mg/dL — ABNORMAL HIGH (ref 6–20)
CO2: 24 mmol/L (ref 22–32)
Calcium: 9.2 mg/dL (ref 8.9–10.3)
Chloride: 108 mmol/L (ref 98–111)
Creatinine, Ser: 0.78 mg/dL (ref 0.44–1.00)
GFR, Estimated: >60 mL/min (ref >60)
Glucose, Bld: 104 mg/dL — ABNORMAL HIGH (ref 70–99)
Potassium: 4 mmol/L (ref 3.5–5.1)
Sodium: 138 mmol/L (ref 135–145)
Total Bilirubin: 0.8 mg/dL (ref 0.3–1.2)
Total Protein: 8 g/dL (ref 6.5–8.1)

## 2021-10-08 MED ORDER — EZETIMIBE 10 MG PO TABS: 10.0000 mg | ORAL_TABLET | Freq: Every day | ORAL | 6 refills | Status: DC

## 2021-10-08 NOTE — Telephone Encounter (Signed)
-----   Message from Theora Gianotti, NP sent at 10/08/2021  2:59 PM EDT ----- Normal electrolytes, liver enzymes, and stable kidney function. LDL 119. 10 year risk of cardiac event calculates to approx 9.5%.  With history of mild nonobstructive heart artery abnormalities, I would like her to resume zetia '10mg'$  daily.  If she's willing to do so, would rec f/u lipids/lft's in 8 wks.

## 2021-10-08 NOTE — Telephone Encounter (Signed)
Spoke with patient and reviewed results and recommendations. She did want me to let provider know she is unable to have the echocardiogram due to the cost of that test. Let her know that I would make him aware. She was agreeable with plan and had no further questions.

## 2021-10-09 ENCOUNTER — Other Ambulatory Visit: Payer: Self-pay | Admitting: Family Medicine

## 2021-10-09 DIAGNOSIS — S39012A Strain of muscle, fascia and tendon of lower back, initial encounter: Secondary | ICD-10-CM

## 2021-10-09 DIAGNOSIS — M62830 Muscle spasm of back: Secondary | ICD-10-CM

## 2021-10-11 ENCOUNTER — Ambulatory Visit: Payer: Self-pay | Admitting: Cardiology

## 2021-10-11 NOTE — Telephone Encounter (Signed)
Requested Prescriptions  Pending Prescriptions Disp Refills  . ibuprofen (ADVIL) 800 MG tablet [Pharmacy Med Name: Ibuprofen 800 MG Oral Tablet] 90 tablet 0    Sig: TAKE 1 TABLET BY MOUTH EVERY 8 HOURS AS NEEDED FOR MODERATE PAIN     Analgesics:  NSAIDS Failed - 10/09/2021  9:31 AM      Failed - Manual Review: Labs are only required if the patient has taken medication for more than 8 weeks.      Failed - Valid encounter within last 12 months    Recent Outpatient Visits          6 months ago Acute non-recurrent frontal sinusitis   [facility] [place], Devonne Doughty, DO   7 months ago Acute non-recurrent frontal sinusitis   [facility] [place], Devonne Doughty, DO   1 year ago Acute non-recurrent frontal sinusitis   [facility] [place], Coralie Keens, NP   1 year ago Abrasion of fifth toe of left foot, initial encounter   [facility] [place], Devonne Doughty, DO   1 year ago Acute non-recurrent frontal sinusitis   [facility], Devonne Doughty, DO             Passed - Cr in normal range and within 360 days    Creat  Date Value Ref Range Status  03/05/2019 0.63 0.50 - 1.05 mg/dL Final    Comment:    For patients >63 years of age, the reference limit for Creatinine is approximately 13% higher for people identified as African-American. .    Creatinine, Ser  Date Value Ref Range Status  10/08/2021 0.78 0.44 - 1.00 mg/dL Final         Passed - HGB in normal range and within 360 days    Hemoglobin  Date Value Ref Range Status  12/14/2020 12.1 12.0 - 15.0 g/dL Final  02/11/2020 13.8 11.1 - 15.9 g/dL Final         Passed - PLT in normal range and within 360 days    Platelets  Date Value Ref Range Status  12/14/2020 245 150 - 400 K/uL Final  02/11/2020 228 150 - 450 x10E3/uL Final         Passed - HCT in normal range and within 360 days    HCT  Date Value Ref Range Status  12/14/2020 37.6  36.0 - 46.0 % Final   Hematocrit  Date Value Ref Range Status  02/11/2020 41.6 34.0 - 46.6 % Final         Passed - eGFR is 30 or above and within 360 days    GFR, Est African American  Date Value Ref Range Status  03/05/2019 115 > OR = 60 mL/min/1.68m Final   GFR calc Af Amer  Date Value Ref Range Status  02/11/2020 103 >59 mL/min/1.73 Final    Comment:    **In accordance with recommendations from the NKF-ASN Task force,**   [facility] is in the process of updating its eGFR calculation to the   2021 CKD-EPI creatinine equation that estimates kidney function   without a race variable.    GFR, Est Non African American  Date Value Ref Range Status  03/05/2019 99 > OR = 60 mL/min/1.736mFinal   GFR, Estimated  Date Value Ref Range Status  10/08/2021 >60 >60 mL/min Final    Comment:    (NOTE) Calculated using the CKD-EPI Creatinine Equation (2021)  Passed - Patient is not pregnant

## 2021-10-19 ENCOUNTER — Encounter: Payer: Self-pay | Admitting: Family Medicine

## 2021-10-19 ENCOUNTER — Ambulatory Visit (INDEPENDENT_AMBULATORY_CARE_PROVIDER_SITE_OTHER): Payer: Self-pay | Admitting: Family Medicine

## 2021-10-19 VITALS — Ht 65.0 in | Wt 324.0 lb

## 2021-10-19 DIAGNOSIS — B379 Candidiasis, unspecified: Secondary | ICD-10-CM

## 2021-10-19 DIAGNOSIS — J3089 Other allergic rhinitis: Secondary | ICD-10-CM

## 2021-10-19 DIAGNOSIS — J0110 Acute frontal sinusitis, unspecified: Secondary | ICD-10-CM

## 2021-10-19 MED ORDER — BENZONATATE 200 MG PO CAPS: 200.0000 mg | ORAL_CAPSULE | Freq: Two times a day (BID) | ORAL | 0 refills | Status: DC | PRN

## 2021-10-19 MED ORDER — FLUCONAZOLE 150 MG PO TABS: ORAL_TABLET | ORAL | 0 refills | Status: DC

## 2021-10-19 MED ORDER — FLUTICASONE PROPIONATE 50 MCG/ACT NA SUSP: 2.0000 | Freq: Every day | NASAL | 3 refills | Status: DC

## 2021-10-19 MED ORDER — AMOXICILLIN-POT CLAVULANATE 875-125 MG PO TABS: 1.0000 | ORAL_TABLET | Freq: Two times a day (BID) | ORAL | 0 refills | Status: DC

## 2021-10-19 NOTE — Progress Notes (Signed)
Virtual Visit via Telephone The purpose of this virtual visit is to provide medical care while limiting exposure to the novel coronavirus (COVID19) for both patient and office staff.  Consent was obtained for phone visit:  Yes.   Answered questions that patient had about telehealth interaction:  Yes.   I discussed the limitations, risks, security and privacy concerns of performing an evaluation and management service by telephone. I also discussed with the patient that there may be a patient responsible charge related to this service. The patient expressed understanding and agreed to proceed.  Patient Location: Home Provider Location: [facility] (Office)  Participants in virtual visit: - Patient: Shannon Small - CMA: Orinda Kenner, CMA - Provider: Dr Parks Ranger  ---------------------------------------------------------------------- Chief Complaint  Patient presents with   Sinusitis   Headache   Sore Throat    S: Reviewed CMA documentation. I have called patient and gathered additional HPI as follows:  Sinusitis Reports that symptoms started 5 days ago with deeper sinus congestion drainage Last sinusitis infection 04/2021 treated with antibiotic course and symptom relief No covid contact, negative home test Out of nasal spray Sinus pain and pressure Denies any fevers, chills, sweats, body ache, shortness of breath, abdominal pain, diarrhea  Past Medical History:  Diagnosis Date   Acid reflux 09/09/2014   Adjustment disorder with depressed mood 11/14/2006   ASSESSMENT: Bereavement. Recommend counseling with pastor or psychologist.    Benign neoplasm of skin 11/14/2006   Cephalalgia 02/10/2006   Chronic HFrEF (heart failure with reduced ejection fraction) ([place])    a. 01/2020 Echo: EF 30-35%.   Chronic infection of sinus 09/09/2014   Extreme obesity 02/10/2006   Hypertension    LBP (low back pain) 01/15/2007   Chronic back strain and spasms due to obesity.     Mixed hyperlipidemia    NICM (nonischemic cardiomyopathy) ([place])    a. 11/2019 MV: EF 29%; b. 01/2020 Echo: EF 30-35%, glob HK, mild LVH, GrI DD, nl RV fxn, mildly dil LA, mod MR; b. 01/2020 Cath nonobs dzs.   Nonobstructive CAD (coronary artery disease)    a. 11/2019 MV: small apical defect - likely breast atten. EF 29%; b. 02/2020 Cath: LM nl, LADnl, LCX nl, OM1/2/3 nl, RCA 20p, RPDA/RPAV nl. EF 25-35%.   Sleep related leg cramps 09/09/2014   Social History   Tobacco Use   Smoking status: Never   Smokeless tobacco: Never  Vaping Use   Vaping Use: Never used  Substance Use Topics   Alcohol use: Yes    Alcohol/week: 0.0 standard drinks of alcohol    Comment: rarely   Drug use: No    Current Outpatient Medications:    acetaminophen (TYLENOL) 500 MG tablet, Take 1,000 mg by mouth every 6 (six) hours as needed for moderate pain or headache., Disp: , Rfl:    albuterol (VENTOLIN HFA) 108 (90 Base) MCG/ACT inhaler, Inhale 2 puffs into the lungs every 4 (four) hours as needed for wheezing or shortness of breath (cough)., Disp: 6.7 g, Rfl: 2   amoxicillin-clavulanate (AUGMENTIN) 875-125 MG tablet, Take 1 tablet by mouth 2 (two) times daily., Disp: 20 tablet, Rfl: 0   aspirin EC 81 MG tablet, Take 1 tablet (81 mg total) by mouth daily. Swallow whole., Disp: 90 tablet, Rfl: 3   benzonatate (TESSALON) 200 MG capsule, Take 1 capsule (200 mg total) by mouth 2 (two) times daily as needed for cough., Disp: 30 capsule, Rfl: 0   clotrimazole-betamethasone (LOTRISONE) cream, APPLY TOPICALLY  TO GENITAL AREA FOR VAGINAL ITCHING AND IRRITATION FOR UP TO 2 WEEKS, THEN STOP, Disp: 45 g, Rfl: 3   ezetimibe (ZETIA) 10 MG tablet, Take 1 tablet (10 mg total) by mouth daily., Disp: 30 tablet, Rfl: 6   fluconazole (DIFLUCAN) 150 MG tablet, Take one tablet by mouth on Day 1. Repeat dose 2nd tablet on Day 3., Disp: 2 tablet, Rfl: 0   fluticasone (FLONASE) 50 MCG/ACT nasal spray, Place 2 sprays into both nostrils  daily. Use for 4-6 weeks then stop and use seasonally or as needed., Disp: 16 g, Rfl: 3   furosemide (LASIX) 40 MG tablet, Take 1 tablet (40 mg total) by mouth daily., Disp: 30 tablet, Rfl: 3   hydrOXYzine (ATARAX/VISTARIL) 25 MG tablet, Take 1 tablet (25 mg total) by mouth 3 (three) times daily as needed for itching., Disp: 60 tablet, Rfl: 2   ibuprofen (ADVIL) 800 MG tablet, TAKE 1 TABLET BY MOUTH EVERY 8 HOURS AS NEEDED FOR MODERATE PAIN, Disp: 90 tablet, Rfl: 1   ketoconazole (NIZORAL) 2 % cream, Apply 1 application topically daily as needed for irritation., Disp: 30 g, Rfl: 2   lisinopril (ZESTRIL) 40 MG tablet, Take 1 tablet (40 mg total) by mouth daily., Disp: 90 tablet, Rfl: 1   metoprolol succinate (TOPROL-XL) 25 MG 24 hr tablet, Take 1 tablet (25 mg total) by mouth daily., Disp: 90 tablet, Rfl: 1   orphenadrine (NORFLEX) 100 MG tablet, TAKE 1 TABLET BY MOUTH TWICE DAILY AS NEEDED FOR MUSCLE SPASM, Disp: 60 tablet, Rfl: 2   spironolactone (ALDACTONE) 25 MG tablet, Take 1 tablet (25 mg total) by mouth daily., Disp: 90 tablet, Rfl: 1   triamcinolone cream (KENALOG) 0.5 %, APPLY CREAM TOPICALLY TO AFFECTED AREA TWICE DAILY FOR UP TO 2 WEEKS, Disp: 30 g, Rfl: 2   hydrocortisone 2.5 % cream, APPLY  CREAM TO AFFECTED AREA TWICE DAILY AS NEEDED ECZEMA (Patient not taking: Reported on 10/05/2021), Disp: 28 g, Rfl: 2  Current Facility-Administered Medications:    sodium chloride flush (NS) 0.9 % injection 3 mL, 3 mL, Intravenous, Q12H, Kate Sable, MD     12/16/2019    2:25 PM 03/05/2019    9:24 AM 01/28/2019   10:10 AM  Depression screen PHQ 2/9  Decreased Interest 0 0 0  Down, Depressed, Hopeless 1 0 0  PHQ - 2 Score 1 0 0  Altered sleeping 1 0 0  Tired, decreased energy 3 0 0  Change in appetite 2 0 0  Feeling bad or failure about yourself  1 0 0  Trouble concentrating 0 0 0  Moving slowly or fidgety/restless 0 0 0  Suicidal thoughts 0 0 0  PHQ-9 Score 8 0 0  Difficult doing  work/chores Somewhat difficult Not difficult at all Not difficult at all       12/16/2019    2:25 PM 03/05/2019    9:26 AM  GAD 7 : Generalized Anxiety Score  Nervous, Anxious, on Edge 1 0  Control/stop worrying 3 0  Worry too much - different things 2 0  Trouble relaxing 2 0  Restless 1 0  Easily annoyed or irritable 2 0  Afraid - awful might happen 0 0  Total GAD 7 Score 11 0  Anxiety Difficulty Somewhat difficult Not difficult at all    -------------------------------------------------------------------------- O: No physical exam performed due to remote telephone encounter.  Lab results reviewed.  Recent Results (from the past 2160 hour(s))  Comprehensive metabolic panel  Status: Abnormal   Collection Time: 10/08/21 10:03 AM  Result Value Ref Range   Sodium 138 135 - 145 mmol/L   Potassium 4.0 3.5 - 5.1 mmol/L   Chloride 108 98 - 111 mmol/L   CO2 24 22 - 32 mmol/L   Glucose, Bld 104 (H) 70 - 99 mg/dL    Comment: Glucose reference range applies only to samples taken after fasting for at least 8 hours.   BUN 22 (H) 6 - 20 mg/dL   Creatinine, Ser 0.78 0.44 - 1.00 mg/dL   Calcium 9.2 8.9 - 10.3 mg/dL   Total Protein 8.0 6.5 - 8.1 g/dL   Albumin 4.1 3.5 - 5.0 g/dL   AST 16 15 - 41 U/L   ALT 14 0 - 44 U/L   Alkaline Phosphatase 84 38 - 126 U/L   Total Bilirubin 0.8 0.3 - 1.2 mg/dL   GFR, Estimated >60 >60 mL/min    Comment: (NOTE) Calculated using the CKD-EPI Creatinine Equation (2021)    Anion gap 6 5 - 15    Comment: Performed at [facility], [address] [place], [place] [address]  Lipid panel     Status: Abnormal   Collection Time: 10/08/21 10:03 AM  Result Value Ref Range   Cholesterol 202 (H) 0 - 200 mg/dL   Triglycerides 68 <150 mg/dL   HDL 69 >40 mg/dL   Total CHOL/HDL Ratio 2.9 RATIO   VLDL 14 0 - 40 mg/dL   LDL Cholesterol 119 (H) 0 - 99 mg/dL    Comment:        Total Cholesterol/HDL:CHD Risk Coronary Heart Disease Risk Table                      Men   Women  1/2 Average Risk   3.4   3.3  Average Risk       5.0   4.4  2 X Average Risk   9.6   7.1  3 X Average Risk  23.4   11.0        Use the calculated Patient Ratio above and the CHD Risk Table to determine the patient's CHD Risk.        ATP III CLASSIFICATION (LDL):  <100     mg/dL   Optimal  100-129  mg/dL   Near or Above                    Optimal  130-159  mg/dL   Borderline  160-189  mg/dL   High  >190     mg/dL   Very High Performed at [facility], [address] [place], [place] [address]     -------------------------------------------------------------------------- A&P:  Problem List Items Addressed This Visit     Allergic rhinitis due to allergen   Relevant Medications   fluticasone (FLONASE) 50 MCG/ACT nasal spray   Other Visit Diagnoses     Acute non-recurrent frontal sinusitis    -  Primary   Relevant Medications   amoxicillin-clavulanate (AUGMENTIN) 875-125 MG tablet   fluticasone (FLONASE) 50 MCG/ACT nasal spray   fluconazole (DIFLUCAN) 150 MG tablet   benzonatate (TESSALON) 200 MG capsule   Antibiotic-induced yeast infection       Relevant Medications   fluconazole (DIFLUCAN) 150 MG tablet      Consistent with acute frontal sinusitis, likely initially viral URI vs allergic rhinitis component with worsening concern for bacterial infection.   Plan: 1. Start Augmentin 875-'125mg'$  PO  BID x 10 days 2. Start Flonase 3. Start Tessalon Perls '200mg'$  PRN 4. Diflucan if needed for antibiotic yeast Return criteria reviewed   Meds ordered this encounter  Medications   amoxicillin-clavulanate (AUGMENTIN) 875-125 MG tablet    Sig: Take 1 tablet by mouth 2 (two) times daily.    Dispense:  20 tablet    Refill:  0   fluticasone (FLONASE) 50 MCG/ACT nasal spray    Sig: Place 2 sprays into both nostrils daily. Use for 4-6 weeks then stop and use seasonally or as needed.    Dispense:  16 g    Refill:  3   fluconazole (DIFLUCAN) 150  MG tablet    Sig: Take one tablet by mouth on Day 1. Repeat dose 2nd tablet on Day 3.    Dispense:  2 tablet    Refill:  0   benzonatate (TESSALON) 200 MG capsule    Sig: Take 1 capsule (200 mg total) by mouth 2 (two) times daily as needed for cough.    Dispense:  30 capsule    Refill:  0    Follow-up: - Return in 1 week if not improved  Patient verbalizes understanding with the above medical recommendations including the limitation of remote medical advice.  Specific follow-up and call-back criteria were given for patient to follow-up or seek medical care more urgently if needed.   - Time spent in direct consultation with patient on phone: 8 minutes   [name], [facility] 10/19/2021, 4:20 PM

## 2021-10-19 NOTE — Patient Instructions (Addendum)
Thank you for coming to the office today.  Sinus Infection, Adult A sinus infection, also called sinusitis, is inflammation of your sinuses. Sinuses are hollow spaces in the bones around your face. Your sinuses are located: Around your eyes. In the middle of your forehead. Behind your nose. In your cheekbones. Mucus normally drains out of your sinuses. When your nasal tissues become inflamed or swollen, mucus can become trapped or blocked. This allows bacteria, viruses, and fungi to grow, which leads to infection. Most infections of the sinuses are caused by a virus. A sinus infection can develop quickly. It can last for up to 4 weeks (acute) or for more than 12 weeks (chronic). A sinus infection often develops after a cold. What are the causes? This condition is caused by anything that creates swelling in the sinuses or stops mucus from draining. This includes: Allergies. Asthma. Infection from bacteria or viruses. Deformities or blockages in your nose or sinuses. Abnormal growths in the nose (nasal polyps). Pollutants, such as chemicals or irritants in the air. Infection from fungi. This is rare. What increases the risk? You are more likely to develop this condition if you: Have a weak body defense system (immune system). Do a lot of swimming or diving. Overuse nasal sprays. Smoke. What are the signs or symptoms? The main symptoms of this condition are pain and a feeling of pressure around the affected sinuses. Other symptoms include: Stuffy nose or congestion that makes it difficult to breathe through your nose. Thick yellow or greenish drainage from your nose. Tenderness, swelling, and warmth over the affected sinuses. A cough that may get worse at night. Decreased sense of smell and taste. Extra mucus that collects in the throat or the back of the nose (postnasal drip) causing a sore throat or bad breath. Tiredness (fatigue). Fever. How is this diagnosed? This condition is  diagnosed based on: Your symptoms. Your medical history. A physical exam. Tests to find out if your condition is acute or chronic. This may include: Checking your nose for nasal polyps. Viewing your sinuses using a device that has a light (endoscope). Testing for allergies or bacteria. Imaging tests, such as an MRI or CT scan. In rare cases, a bone biopsy may be done to rule out more serious types of fungal sinus disease. How is this treated? Treatment for a sinus infection depends on the cause and whether your condition is chronic or acute. If caused by a virus, your symptoms should go away on their own within 10 days. You may be given medicines to relieve symptoms. They include: Medicines that shrink swollen nasal passages (decongestants). A spray that eases inflammation of the nostrils (topical intranasal corticosteroids). Rinses that help get rid of thick mucus in your nose (nasal saline washes). Medicines that treat allergies (antihistamines). Over-the-counter pain relievers. If caused by bacteria, your health care provider may recommend waiting to see if your symptoms improve. Most bacterial infections will get better without antibiotic medicine. You may be given antibiotics if you have: A severe infection. A weak immune system. If caused by narrow nasal passages or nasal polyps, surgery may be needed. Follow these instructions at home: Medicines Take, use, or apply over-the-counter and prescription medicines only as told by your health care provider. These may include nasal sprays. If you were prescribed an antibiotic medicine, take it as told by your health care provider. Do not stop taking the antibiotic even if you start to feel better. Hydrate and humidify  Drink enough fluid to  keep your urine pale yellow. Staying hydrated will help to thin your mucus. Use a cool mist humidifier to keep the humidity level in your home above 50%. Inhale steam for 10-15 minutes, 3-4 times a  day, or as told by your health care provider. You can do this in the bathroom while a hot shower is running. Limit your exposure to cool or dry air. Rest Rest as much as possible. Sleep with your head raised (elevated). Make sure you get enough sleep each night. General instructions  Apply a warm, moist washcloth to your face 3-4 times a day or as told by your health care provider. This will help with discomfort. Use nasal saline washes as often as told by your health care provider. Wash your hands often with soap and water to reduce your exposure to germs. If soap and water are not available, use hand sanitizer. Do not smoke. Avoid being around people who are smoking (secondhand smoke). Keep all follow-up visits. This is important. Contact a health care provider if: You have a fever. Your symptoms get worse. Your symptoms do not improve within 10 days. Get help right away if: You have a severe headache. You have persistent vomiting. You have severe pain or swelling around your face or eyes. You have vision problems. You develop confusion. Your neck is stiff. You have trouble breathing. These symptoms may be an emergency. Get help right away. Call 911. Do not wait to see if the symptoms will go away. Do not drive yourself to the hospital. Summary A sinus infection is soreness and inflammation of your sinuses. Sinuses are hollow spaces in the bones around your face. This condition is caused by nasal tissues that become inflamed or swollen. The swelling traps or blocks the flow of mucus. This allows bacteria, viruses, and fungi to grow, which leads to infection. If you were prescribed an antibiotic medicine, take it as told by your health care provider. Do not stop taking the antibiotic even if you start to feel better. Keep all follow-up visits. This is important. This information is not intended to replace advice given to you by your health care provider. Make sure you discuss any  questions you have with your health care provider. Document Revised: 12/01/2020 Document Reviewed: 12/01/2020 Elsevier Patient Education  [place].    Please schedule a Follow-up Appointment to: Return if symptoms worsen or fail to improve.  If you have any other questions or concerns, please feel free to call the office or send a message through [facility]. You may also schedule an earlier appointment if necessary.  Additionally, you may be receiving a survey about your experience at our office within a few days to 1 week by e-mail or mail. We value your feedback.  [name], DO [place]

## 2021-11-03 ENCOUNTER — Other Ambulatory Visit: Payer: Self-pay | Admitting: Family Medicine

## 2021-11-03 DIAGNOSIS — I10 Essential (primary) hypertension: Secondary | ICD-10-CM

## 2021-11-04 NOTE — Telephone Encounter (Signed)
Requested medication (s) are due for refill today: Yes  Requested medication (s) are on the active medication list: Yes  Last refill:  05/20/21  Future visit scheduled: No  Notes to clinic:  No OV's addressing her chronic issues, routing to provider for approval of refills     Requested Prescriptions  Pending Prescriptions Disp Refills   lisinopril (ZESTRIL) 40 MG tablet [Pharmacy Med Name: Lisinopril 40 MG Oral Tablet] 90 tablet 0    Sig: Take 1 tablet by mouth once daily     Cardiovascular:  ACE Inhibitors Failed - 11/03/2021  4:12 PM      Failed - Valid encounter within last 6 months    Recent Outpatient Visits           2 weeks ago Acute non-recurrent frontal sinusitis   [facility] [place], Devonne Doughty, DO   6 months ago Acute non-recurrent frontal sinusitis   [facility] [place], Devonne Doughty, DO   8 months ago Acute non-recurrent frontal sinusitis   [facility] [place], Devonne Doughty, DO   1 year ago Acute non-recurrent frontal sinusitis   [facility] [place], [place] W, NP   1 year ago Abrasion of fifth toe of left foot, initial encounter   [place], Devonne Doughty, DO              Passed - Cr in normal range and within 180 days    Creat  Date Value Ref Range Status  03/05/2019 0.63 0.50 - 1.05 mg/dL Final    Comment:    For patients >39 years of age, the reference limit for Creatinine is approximately 13% higher for people identified as African-American. .    Creatinine, Ser  Date Value Ref Range Status  10/08/2021 0.78 0.44 - 1.00 mg/dL Final         Passed - K in normal range and within 180 days    Potassium  Date Value Ref Range Status  10/08/2021 4.0 3.5 - 5.1 mmol/L Final  07/30/2013 3.7 3.5 - 5.1 mmol/L Final         Passed - Patient is not pregnant      Passed - Last BP in normal range    BP Readings from Last 1 Encounters:  10/05/21  138/78          metoprolol succinate (TOPROL-XL) 25 MG 24 hr tablet [Pharmacy Med Name: Metoprolol Succinate ER 25 MG Oral Tablet Extended Release 24 Hour] 90 tablet 0    Sig: Take 1 tablet by mouth once daily     Cardiovascular:  Beta Blockers Failed - 11/03/2021  4:12 PM      Failed - Valid encounter within last 6 months    Recent Outpatient Visits           2 weeks ago Acute non-recurrent frontal sinusitis   [facility] Olin Hauser, DO   6 months ago Acute non-recurrent frontal sinusitis   [facility] [place], Devonne Doughty, DO   8 months ago Acute non-recurrent frontal sinusitis   [facility] [place], Devonne Doughty, DO   1 year ago Acute non-recurrent frontal sinusitis   [facility] [place], Coralie Keens, NP   1 year ago Abrasion of fifth toe of left foot, initial encounter   [place], Devonne Doughty, DO              Passed -  Last BP in normal range    BP Readings from Last 1 Encounters:  10/05/21 138/78         Passed - Last Heart Rate in normal range    Pulse Readings from Last 1 Encounters:  10/05/21 80          spironolactone (ALDACTONE) 25 MG tablet [Pharmacy Med Name: Spironolactone 25 MG Oral Tablet] 90 tablet 0    Sig: Take 1 tablet by mouth once daily     Cardiovascular: Diuretics - Aldosterone Antagonist Failed - 11/03/2021  4:12 PM      Failed - Valid encounter within last 6 months    Recent Outpatient Visits           2 weeks ago Acute non-recurrent frontal sinusitis   [facility] Olin Hauser, DO   6 months ago Acute non-recurrent frontal sinusitis   [facility] [place], Devonne Doughty, DO   8 months ago Acute non-recurrent frontal sinusitis   [facility] [place], Devonne Doughty, DO   1 year ago Acute non-recurrent frontal sinusitis   [facility] [place], [place] W, NP    1 year ago Abrasion of fifth toe of left foot, initial encounter   [facility] [place], Devonne Doughty, DO              Passed - Cr in normal range and within 180 days    Creat  Date Value Ref Range Status  03/05/2019 0.63 0.50 - 1.05 mg/dL Final    Comment:    For patients >63 years of age, the reference limit for Creatinine is approximately 13% higher for people identified as African-American. .    Creatinine, Ser  Date Value Ref Range Status  10/08/2021 0.78 0.44 - 1.00 mg/dL Final         Passed - K in normal range and within 180 days    Potassium  Date Value Ref Range Status  10/08/2021 4.0 3.5 - 5.1 mmol/L Final  07/30/2013 3.7 3.5 - 5.1 mmol/L Final         Passed - Na in normal range and within 180 days    Sodium  Date Value Ref Range Status  10/08/2021 138 135 - 145 mmol/L Final  02/11/2020 137 134 - 144 mmol/L Final  07/30/2013 140 136 - 145 mmol/L Final         Passed - eGFR is 30 or above and within 180 days    GFR, Est African American  Date Value Ref Range Status  03/05/2019 115 > OR = 60 mL/min/1.40m Final   GFR calc Af Amer  Date Value Ref Range Status  02/11/2020 103 >59 mL/min/1.73 Final    Comment:    **In accordance with recommendations from the NKF-ASN Task force,**   [facility] is in the process of updating its eGFR calculation to the   2021 CKD-EPI creatinine equation that estimates kidney function   without a race variable.    GFR, Est Non African American  Date Value Ref Range Status  03/05/2019 99 > OR = 60 mL/min/1.750mFinal   GFR, Estimated  Date Value Ref Range Status  10/08/2021 >60 >60 mL/min Final    Comment:    (NOTE) Calculated using the CKD-EPI Creatinine Equation (2021)          Passed - Last BP in normal range    BP Readings from Last 1 Encounters:  10/05/21 138/78

## 2021-11-08 ENCOUNTER — Encounter (INDEPENDENT_AMBULATORY_CARE_PROVIDER_SITE_OTHER): Payer: Self-pay

## 2021-11-12 ENCOUNTER — Ambulatory Visit: Payer: Self-pay

## 2021-11-12 ENCOUNTER — Other Ambulatory Visit: Payer: Self-pay

## 2021-11-12 DIAGNOSIS — M170 Bilateral primary osteoarthritis of knee: Secondary | ICD-10-CM

## 2021-11-12 DIAGNOSIS — L299 Pruritus, unspecified: Secondary | ICD-10-CM

## 2021-11-12 DIAGNOSIS — I10 Essential (primary) hypertension: Secondary | ICD-10-CM

## 2021-11-12 DIAGNOSIS — L309 Dermatitis, unspecified: Secondary | ICD-10-CM

## 2021-11-12 DIAGNOSIS — S39012A Strain of muscle, fascia and tendon of lower back, initial encounter: Secondary | ICD-10-CM

## 2021-11-12 DIAGNOSIS — M62830 Muscle spasm of back: Secondary | ICD-10-CM

## 2021-11-12 DIAGNOSIS — B372 Candidiasis of skin and nail: Secondary | ICD-10-CM

## 2021-11-12 DIAGNOSIS — I872 Venous insufficiency (chronic) (peripheral): Secondary | ICD-10-CM

## 2021-11-12 MED ORDER — SPIRONOLACTONE 25 MG PO TABS: 25.0000 mg | ORAL_TABLET | Freq: Every day | ORAL | 1 refills | Status: DC

## 2021-11-12 MED ORDER — METOPROLOL SUCCINATE ER 25 MG PO TB24: 25.0000 mg | ORAL_TABLET | Freq: Every day | ORAL | 1 refills | Status: DC

## 2021-11-12 MED ORDER — ORPHENADRINE CITRATE ER 100 MG PO TB12: ORAL_TABLET | ORAL | 2 refills | Status: DC

## 2021-11-12 MED ORDER — CLOTRIMAZOLE-BETAMETHASONE 1-0.05 % EX CREA: TOPICAL_CREAM | CUTANEOUS | 3 refills | Status: DC

## 2021-11-12 MED ORDER — TRIAMCINOLONE ACETONIDE 0.5 % EX CREA: TOPICAL_CREAM | CUTANEOUS | 2 refills | Status: DC

## 2021-11-12 MED ORDER — DICLOFENAC SODIUM 1 % EX GEL: 2.0000 g | Freq: Four times a day (QID) | CUTANEOUS | 5 refills | Status: DC | PRN

## 2021-11-12 MED ORDER — IBUPROFEN 800 MG PO TABS: ORAL_TABLET | ORAL | 2 refills | Status: DC

## 2021-11-12 MED ORDER — LISINOPRIL 40 MG PO TABS: 40.0000 mg | ORAL_TABLET | Freq: Every day | ORAL | 1 refills | Status: DC

## 2021-11-12 MED ORDER — HYDROXYZINE HCL 25 MG PO TABS: 25.0000 mg | ORAL_TABLET | Freq: Three times a day (TID) | ORAL | 3 refills | Status: DC | PRN

## 2021-11-12 NOTE — Telephone Encounter (Signed)
  Chief Complaint: Knee pain - requesting medication Symptoms: knee pain Frequency:  Pertinent Negatives: Patient denies  Disposition: '[]'$ ED /'[]'$ Urgent Care (no appt availability in office) / '[]'$ Appointment(In office/virtual)/ '[]'$  [facility]/ '[]'$ Home Care/ '[]'$ Refused Recommended Disposition /'[]'$ [facility]/ '[x]'$  Follow-up with PCP Additional Notes: Pt has been using Diclofenac 1% that was given to her by a friend for her knee pain. She states that it has been very helpful.PT would like a prescription called in for this.   PT states that she is financially unable to come for an office visit.   Summary: knee pain   Pt states she is having knee pain and requesting a topical medication she has been getting from a friend  Pt states provider advised her if medication helps he would prescribe it  Please assist further     Reason for Disposition  Prescription request for new medicine (not a refill)  Answer Assessment - Initial Assessment Questions 1. DRUG NAME: "What medicine do you need to have refilled?"     Diclofenac 1% 2. REFILLS REMAINING: "How many refills are remaining?" (Note: The label on the medicine or pill bottle will show how many refills are remaining. If there are no refills remaining, then a renewal may be needed.)     none 3. EXPIRATION DATE: "What is the expiration date?" (Note: The label states when the prescription will expire, and thus can no longer be refilled.)      4. PRESCRIBING HCP: "Who prescribed it?" Reason: If prescribed by specialist, call should be referred to that group.      5. SYMPTOMS: "Do you have any symptoms?"     Knee pain 6. PREGNANCY: "Is there any chance that you are pregnant?" "When was your last menstrual period?"  Protocols used: Medication Refill and Renewal Call-A-AH

## 2021-11-12 NOTE — Addendum Note (Signed)
Addended by: Olin Hauser on: 11/12/2021 12:48 PM   Modules accepted: Orders

## 2021-11-12 NOTE — Telephone Encounter (Signed)
Requested medication (s) are due for refill today: yes  Requested medication (s) are on the active medication list: yes Last refill:  multiple dates  Future visit scheduled: no  Notes to clinic:  Unable to refill per protocol, cannot delegate. several of the rx request are creams last refilled in 2021, routing for review.      Requested Prescriptions  Pending Prescriptions Disp Refills   clotrimazole-betamethasone (LOTRISONE) cream 45 g 3     Off-Protocol Failed - 11/12/2021  8:45 AM      Failed - Medication not assigned to a protocol, review manually.      Failed - Valid encounter within last 12 months    Recent Outpatient Visits           3 weeks ago Acute non-recurrent frontal sinusitis   [facility] [place], Devonne Doughty, DO   7 months ago Acute non-recurrent frontal sinusitis   [facility] [place], Devonne Doughty, DO   8 months ago Acute non-recurrent frontal sinusitis   [facility] [place], Devonne Doughty, DO   1 year ago Acute non-recurrent frontal sinusitis   [facility] [place], Coralie Keens, NP   1 year ago Abrasion of fifth toe of left foot, initial encounter   [place], DO               hydrocortisone 2.5 % cream 28 g 2     Off-Protocol Failed - 11/12/2021  8:45 AM      Failed - Medication not assigned to a protocol, review manually.      Failed - Valid encounter within last 12 months    Recent Outpatient Visits           3 weeks ago Acute non-recurrent frontal sinusitis   [facility] [place], Devonne Doughty, DO   7 months ago Acute non-recurrent frontal sinusitis   [facility] [place], Devonne Doughty, DO   8 months ago Acute non-recurrent frontal sinusitis   [facility] [place], Devonne Doughty, DO   1 year ago Acute non-recurrent frontal sinusitis   [facility] [place], [place],  NP   1 year ago Abrasion of fifth toe of left foot, initial encounter   [place], DO               hydrOXYzine (ATARAX) 25 MG tablet 60 tablet 2    Sig: Take 1 tablet (25 mg total) by mouth 3 (three) times daily as needed for itching.     Ear, Nose, and Throat:  Antihistamines 2 Failed - 11/12/2021  8:45 AM      Failed - Valid encounter within last 12 months    Recent Outpatient Visits           3 weeks ago Acute non-recurrent frontal sinusitis   [facility] [place], Devonne Doughty, DO   7 months ago Acute non-recurrent frontal sinusitis   [facility] [place], Devonne Doughty, DO   8 months ago Acute non-recurrent frontal sinusitis   [facility] [place], Devonne Doughty, DO   1 year ago Acute non-recurrent frontal sinusitis   [facility] [place], Coralie Keens, NP   1 year ago Abrasion of fifth toe of left foot, initial encounter   [place], Devonne Doughty, [place]  Passed - Cr in normal range and within 360 days    Creat  Date Value Ref Range Status  03/05/2019 0.63 0.50 - 1.05 mg/dL Final    Comment:    For patients >42 years of age, the reference limit for Creatinine is approximately 13% higher for people identified as African-American. .    Creatinine, Ser  Date Value Ref Range Status  10/08/2021 0.78 0.44 - 1.00 mg/dL Final          ibuprofen (ADVIL) 800 MG tablet 90 tablet 1     Analgesics:  NSAIDS Failed - 11/12/2021  8:45 AM      Failed - Manual Review: Labs are only required if the patient has taken medication for more than 8 weeks.      Failed - Valid encounter within last 12 months    Recent Outpatient Visits           3 weeks ago Acute non-recurrent frontal sinusitis   [facility] [place], Devonne Doughty, DO   7 months ago Acute non-recurrent frontal sinusitis   [facility]  [place], Devonne Doughty, DO   8 months ago Acute non-recurrent frontal sinusitis   [facility] [place], Devonne Doughty, DO   1 year ago Acute non-recurrent frontal sinusitis   [facility] [place], [place], NP   1 year ago Abrasion of fifth toe of left foot, initial encounter   [facility], Devonne Doughty, DO              Passed - Cr in normal range and within 360 days    Creat  Date Value Ref Range Status  03/05/2019 0.63 0.50 - 1.05 mg/dL Final    Comment:    For patients >27 years of age, the reference limit for Creatinine is approximately 13% higher for people identified as African-American. .    Creatinine, Ser  Date Value Ref Range Status  10/08/2021 0.78 0.44 - 1.00 mg/dL Final         Passed - HGB in normal range and within 360 days    Hemoglobin  Date Value Ref Range Status  12/14/2020 12.1 12.0 - 15.0 g/dL Final  02/11/2020 13.8 11.1 - 15.9 g/dL Final         Passed - PLT in normal range and within 360 days    Platelets  Date Value Ref Range Status  12/14/2020 245 150 - 400 K/uL Final  02/11/2020 228 150 - 450 x10E3/uL Final         Passed - HCT in normal range and within 360 days    HCT  Date Value Ref Range Status  12/14/2020 37.6 36.0 - 46.0 % Final   Hematocrit  Date Value Ref Range Status  02/11/2020 41.6 34.0 - 46.6 % Final         Passed - eGFR is 30 or above and within 360 days    GFR, Est African American  Date Value Ref Range Status  03/05/2019 115 > OR = 60 mL/min/1.68m Final   GFR calc Af Amer  Date Value Ref Range Status  02/11/2020 103 >59 mL/min/1.73 Final    Comment:    **In accordance with recommendations from the NKF-ASN Task force,**   [facility] is in the process of updating its eGFR calculation to the   2021 CKD-EPI creatinine equation that estimates kidney function   without a race variable.    GFR, Est Non African American  Date Value Ref Range Status   03/05/2019 99 > OR = 60 mL/min/1.67m Final   GFR, Estimated  Date Value Ref Range Status  10/08/2021 >60 >60 mL/min Final    Comment:    (NOTE) Calculated using the CKD-EPI Creatinine Equation (2021)          Passed - Patient is not pregnant       ketoconazole (NIZORAL) 2 % cream 30 g 2    Sig: Apply 1 Application topically daily as needed for irritation.     Not Delegated - Over the Counter: OTC 2 Failed - 11/12/2021  8:45 AM      Failed - This refill cannot be delegated      Failed - Valid encounter within last 12 months    Recent Outpatient Visits           3 weeks ago Acute non-recurrent frontal sinusitis   [facility]   7 months ago Acute non-recurrent frontal sinusitis   [facility]   8 months ago Acute non-recurrent frontal sinusitis   [facility]   1 year ago Acute non-recurrent frontal sinusitis   [facility] [place] [facility]   1 year ago Abrasion of fifth toe of left foot, initial encounter   [place] DO               lisinopril (ZESTRIL) 40 MG tablet 90 tablet 1    Sig: Take 1 tablet (40 mg total) by mouth daily.     Cardiovascular:  ACE Inhibitors Failed - 11/12/2021  8:45 AM      Failed - Valid encounter within last 6 months    Recent Outpatient Visits           3 weeks ago Acute non-recurrent frontal sinusitis   [facility]   7 months ago Acute non-recurrent frontal sinusitis   [facility] [place]   8 months ago Acute non-recurrent frontal sinusitis   [facility]   1 year ago Acute non-recurrent frontal sinusitis   [facility] [place] [facility]   1 year ago Abrasion of fifth toe of left foot, initial  encounter   [facility]              Passed - Cr in normal range and within 180 days    Creat  Date Value Ref Range Status  03/05/2019 0.63 0.50 - 1.05 mg/dL Final    Comment:    For patients >487years of age, the reference limit for Creatinine is approximately 13% higher for people identified as African-American. .    Creatinine, Ser  Date Value Ref Range Status  10/08/2021 0.78 0.44 - 1.00 mg/dL Final         Passed - K in normal range and within 180 days    Potassium  Date Value Ref Range Status  10/08/2021 4.0 3.5 - 5.1 mmol/L Final  07/30/2013 3.7 3.5 - 5.1 mmol/L Final         Passed - Patient is not pregnant      Passed - Last BP in normal range    BP Readings from Last 1 Encounters:  10/05/21 138/78  metoprolol succinate (TOPROL-XL) 25 MG 24 hr tablet 90 tablet 1    Sig: Take 1 tablet (25 mg total) by mouth daily.     Cardiovascular:  Beta Blockers Failed - 11/12/2021  8:45 AM      Failed - Valid encounter within last 6 months    Recent Outpatient Visits           3 weeks ago Acute non-recurrent frontal sinusitis   [facility] [place], Devonne Doughty, DO   7 months ago Acute non-recurrent frontal sinusitis   [facility] [place], Devonne Doughty, DO   8 months ago Acute non-recurrent frontal sinusitis   [facility] [place], Devonne Doughty, DO   1 year ago Acute non-recurrent frontal sinusitis   [facility] [place], Coralie Keens, NP   1 year ago Abrasion of fifth toe of left foot, initial encounter   [place], DO              Passed - Last BP in normal range    BP Readings from Last 1 Encounters:  10/05/21 138/78         Passed - Last Heart Rate in normal range    Pulse Readings from Last 1 Encounters:  10/05/21 80          orphenadrine (NORFLEX) 100 MG tablet 60 tablet 2     Not  Delegated - Analgesics:  Muscle Relaxants Failed - 11/12/2021  8:45 AM      Failed - This refill cannot be delegated      Failed - Valid encounter within last 6 months    Recent Outpatient Visits           3 weeks ago Acute non-recurrent frontal sinusitis   [facility] [place], Devonne Doughty, DO   7 months ago Acute non-recurrent frontal sinusitis   [facility] [place], Devonne Doughty, DO   8 months ago Acute non-recurrent frontal sinusitis   [facility] [place], Devonne Doughty, DO   1 year ago Acute non-recurrent frontal sinusitis   [facility] [place], [place] W, NP   1 year ago Abrasion of fifth toe of left foot, initial encounter   [place], DO               spironolactone (ALDACTONE) 25 MG tablet 90 tablet 1    Sig: Take 1 tablet (25 mg total) by mouth daily.     Cardiovascular: Diuretics - Aldosterone Antagonist Failed - 11/12/2021  8:45 AM      Failed - Valid encounter within last 6 months    Recent Outpatient Visits           3 weeks ago Acute non-recurrent frontal sinusitis   [facility] [place], Devonne Doughty, DO   7 months ago Acute non-recurrent frontal sinusitis   [facility] [place], Devonne Doughty, DO   8 months ago Acute non-recurrent frontal sinusitis   [facility] [place], Devonne Doughty, DO   1 year ago Acute non-recurrent frontal sinusitis   [facility] [place], Coralie Keens, NP   1 year ago Abrasion of fifth toe of left foot, initial encounter   [place], DO              Passed - Cr in normal range and within 180 days  Creat  Date Value Ref Range Status  03/05/2019 0.63 0.50 - 1.05 mg/dL Final    Comment:    For patients >64 years of age, the reference limit for Creatinine is approximately 13% higher for people identified as  African-American. .    Creatinine, Ser  Date Value Ref Range Status  10/08/2021 0.78 0.44 - 1.00 mg/dL Final         Passed - K in normal range and within 180 days    Potassium  Date Value Ref Range Status  10/08/2021 4.0 3.5 - 5.1 mmol/L Final  07/30/2013 3.7 3.5 - 5.1 mmol/L Final         Passed - Na in normal range and within 180 days    Sodium  Date Value Ref Range Status  10/08/2021 138 135 - 145 mmol/L Final  02/11/2020 137 134 - 144 mmol/L Final  07/30/2013 140 136 - 145 mmol/L Final         Passed - eGFR is 30 or above and within 180 days    GFR, Est African American  Date Value Ref Range Status  03/05/2019 115 > OR = 60 mL/min/1.26m Final   GFR calc Af Amer  Date Value Ref Range Status  02/11/2020 103 >59 mL/min/1.73 Final    Comment:    **In accordance with recommendations from the NKF-ASN Task force,**   [facility] is in the process of updating its eGFR calculation to the   2021 CKD-EPI creatinine equation that estimates kidney function   without a race variable.    GFR, Est Non African American  Date Value Ref Range Status  03/05/2019 99 > OR = 60 mL/min/1.761mFinal   GFR, Estimated  Date Value Ref Range Status  10/08/2021 >60 >60 mL/min Final    Comment:    (NOTE) Calculated using the CKD-EPI Creatinine Equation (2021)          Passed - Last BP in normal range    BP Readings from Last 1 Encounters:  10/05/21 138/78          triamcinolone cream (KENALOG) 0.5 % 30 g 2     Not Delegated - Dermatology:  Corticosteroids Failed - 11/12/2021  8:45 AM      Failed - This refill cannot be delegated      Failed - Valid encounter within last 12 months    Recent Outpatient Visits           3 weeks ago Acute non-recurrent frontal sinusitis   [facility]   7 months ago Acute non-recurrent frontal sinusitis   [facility]   8 months ago Acute non-recurrent  frontal sinusitis   [facility]   1 year ago Acute non-recurrent frontal sinusitis   [facility] Ke[facility]   1 year ago Abrasion of fifth toe of left foot, initial encounter   [place]oughty[place]

## 2022-01-18 ENCOUNTER — Encounter: Payer: Self-pay | Admitting: Family Medicine

## 2022-01-18 ENCOUNTER — Telehealth (INDEPENDENT_AMBULATORY_CARE_PROVIDER_SITE_OTHER): Payer: Self-pay | Admitting: Family Medicine

## 2022-01-18 VITALS — Ht 65.0 in | Wt 324.0 lb

## 2022-01-18 DIAGNOSIS — B379 Candidiasis, unspecified: Secondary | ICD-10-CM

## 2022-01-18 DIAGNOSIS — J0110 Acute frontal sinusitis, unspecified: Secondary | ICD-10-CM

## 2022-01-18 MED ORDER — AMOXICILLIN-POT CLAVULANATE 875-125 MG PO TABS: 1.0000 | ORAL_TABLET | Freq: Two times a day (BID) | ORAL | 1 refills | Status: DC

## 2022-01-18 MED ORDER — IPRATROPIUM BROMIDE 0.06 % NA SOLN: 2.0000 | Freq: Four times a day (QID) | NASAL | 0 refills | Status: DC

## 2022-01-18 MED ORDER — BENZONATATE 200 MG PO CAPS: 200.0000 mg | ORAL_CAPSULE | Freq: Two times a day (BID) | ORAL | 0 refills | Status: DC | PRN

## 2022-01-18 MED ORDER — PREDNISONE 20 MG PO TABS: ORAL_TABLET | ORAL | 0 refills | Status: DC

## 2022-01-18 MED ORDER — FLUCONAZOLE 150 MG PO TABS: ORAL_TABLET | ORAL | 1 refills | Status: DC

## 2022-01-18 NOTE — Patient Instructions (Addendum)

## 2022-01-18 NOTE — Progress Notes (Signed)
Virtual Visit via Telephone The purpose of this virtual visit is to provide medical care while limiting exposure to the novel coronavirus (COVID19) for both patient and office staff.  Consent was obtained for phone visit:  Yes.   Answered questions that patient had about telehealth interaction:  Yes.   I discussed the limitations, risks, security and privacy concerns of performing an evaluation and management service by telephone. I also discussed with the patient that there may be a patient responsible charge related to this service. The patient expressed understanding and agreed to proceed.  Patient Location: Home Provider Location: [facility] (Office)  Participants in virtual visit: - Patient: Shannon Small - CMA: Orinda Kenner, CMA - Provider: Dr Parks Ranger  ---------------------------------------------------------------------- Chief Complaint  Patient presents with   Nasal Congestion   Cough   Shortness of Breath    S: Reviewed CMA documentation. I have called patient and gathered additional HPI as follows:  Sinusitis Reports that symptoms started few weeks ago coughing up thicker green sputum, and nasal drainage, some blood tinged with nose blowing, headache sinus pressure congestion. - Tried OTC without relief Last time similar sinusitis 10/2021 treated with antibiotic course and prednisone with resolution.  Denies any known or suspected exposure to person with or possibly with COVID19.  Denies any fevers, chills, sweats, body ache, shortness of breath, abdominal pain, diarrhea  Past Medical History:  Diagnosis Date   Acid reflux 09/09/2014   Adjustment disorder with depressed mood 11/14/2006   ASSESSMENT: Bereavement. Recommend counseling with pastor or psychologist.    Benign neoplasm of skin 11/14/2006   Cephalalgia 02/10/2006   Chronic HFrEF (heart failure with reduced ejection fraction) ([place])    a. 01/2020 Echo: EF 30-35%.   Chronic infection  of sinus 09/09/2014   Extreme obesity 02/10/2006   Hypertension    LBP (low back pain) 01/15/2007   Chronic back strain and spasms due to obesity.    Mixed hyperlipidemia    NICM (nonischemic cardiomyopathy) ([place])    a. 11/2019 MV: EF 29%; b. 01/2020 Echo: EF 30-35%, glob HK, mild LVH, GrI DD, nl RV fxn, mildly dil LA, mod MR; b. 01/2020 Cath nonobs dzs.   Nonobstructive CAD (coronary artery disease)    a. 11/2019 MV: small apical defect - likely breast atten. EF 29%; b. 02/2020 Cath: LM nl, LADnl, LCX nl, OM1/2/3 nl, RCA 20p, RPDA/RPAV nl. EF 25-35%.   Sleep related leg cramps 09/09/2014   Social History   Tobacco Use   Smoking status: Never   Smokeless tobacco: Never  Vaping Use   Vaping Use: Never used  Substance Use Topics   Alcohol use: Yes    Alcohol/week: 0.0 standard drinks of alcohol    Comment: rarely   Drug use: No    Current Outpatient Medications:    acetaminophen (TYLENOL) 500 MG tablet, Take 1,000 mg by mouth every 6 (six) hours as needed for moderate pain or headache., Disp: , Rfl:    albuterol (VENTOLIN HFA) 108 (90 Base) MCG/ACT inhaler, Inhale 2 puffs into the lungs every 4 (four) hours as needed for wheezing or shortness of breath (cough)., Disp: 6.7 g, Rfl: 2   amoxicillin-clavulanate (AUGMENTIN) 875-125 MG tablet, Take 1 tablet by mouth 2 (two) times daily., Disp: 20 tablet, Rfl: 1   aspirin EC 81 MG tablet, Take 1 tablet (81 mg total) by mouth daily. Swallow whole., Disp: 90 tablet, Rfl: 3   clotrimazole-betamethasone (LOTRISONE) cream, APPLY TOPICALLY TO GENITAL AREA FOR VAGINAL  ITCHING AND IRRITATION FOR UP TO 2 WEEKS, THEN STOP, Disp: 45 g, Rfl: 3   diclofenac Sodium (VOLTAREN) 1 % GEL, Apply 2 g topically 4 (four) times daily as needed (knee joint pain)., Disp: 100 g, Rfl: 5   fluticasone (FLONASE) 50 MCG/ACT nasal spray, Place 2 sprays into both nostrils daily. Use for 4-6 weeks then stop and use seasonally or as needed., Disp: 16 g, Rfl: 3   furosemide  (LASIX) 40 MG tablet, Take 1 tablet (40 mg total) by mouth daily., Disp: 30 tablet, Rfl: 3   hydrOXYzine (ATARAX) 25 MG tablet, Take 1 tablet (25 mg total) by mouth 3 (three) times daily as needed for itching., Disp: 90 tablet, Rfl: 3   ibuprofen (ADVIL) 800 MG tablet, TAKE 1 TABLET BY MOUTH EVERY 8 HOURS AS NEEDED FOR MODERATE PAIN, Disp: 90 tablet, Rfl: 2   ipratropium (ATROVENT) 0.06 % nasal spray, Place 2 sprays into both nostrils 4 (four) times daily. For up to 5-7 days then stop., Disp: 15 mL, Rfl: 0   ketoconazole (NIZORAL) 2 % cream, Apply 1 application topically daily as needed for irritation., Disp: 30 g, Rfl: 2   lisinopril (ZESTRIL) 40 MG tablet, Take 1 tablet (40 mg total) by mouth daily., Disp: 90 tablet, Rfl: 1   metoprolol succinate (TOPROL-XL) 25 MG 24 hr tablet, Take 1 tablet (25 mg total) by mouth daily., Disp: 90 tablet, Rfl: 1   orphenadrine (NORFLEX) 100 MG tablet, TAKE 1 TABLET BY MOUTH TWICE DAILY AS NEEDED FOR MUSCLE SPASM, Disp: 60 tablet, Rfl: 2   predniSONE (DELTASONE) 20 MG tablet, Take daily with food. Start with '60mg'$  (3 pills) x 2 days, then reduce to '40mg'$  (2 pills) x 2 days, then '20mg'$  (1 pill) x 3 days, Disp: 13 tablet, Rfl: 0   spironolactone (ALDACTONE) 25 MG tablet, Take 1 tablet (25 mg total) by mouth daily., Disp: 90 tablet, Rfl: 1   triamcinolone cream (KENALOG) 0.5 %, APPLY CREAM TOPICALLY TO AFFECTED AREA TWICE DAILY FOR UP TO 2 WEEKS, Disp: 30 g, Rfl: 2   benzonatate (TESSALON) 200 MG capsule, Take 1 capsule (200 mg total) by mouth 2 (two) times daily as needed for cough., Disp: 30 capsule, Rfl: 0   ezetimibe (ZETIA) 10 MG tablet, Take 1 tablet (10 mg total) by mouth daily., Disp: 30 tablet, Rfl: 6   fluconazole (DIFLUCAN) 150 MG tablet, Take one tablet by mouth on Day 1. Repeat dose 2nd tablet on Day 3., Disp: 2 tablet, Rfl: 1   hydrocortisone 2.5 % cream, APPLY  CREAM TO AFFECTED AREA TWICE DAILY AS NEEDED ECZEMA (Patient not taking: Reported on 10/05/2021),  Disp: 28 g, Rfl: 2  Current Facility-Administered Medications:    sodium chloride flush (NS) 0.9 % injection 3 mL, 3 mL, Intravenous, Q12H, Kate Sable, MD     12/16/2019    2:25 PM 03/05/2019    9:24 AM 01/28/2019   10:10 AM  Depression screen PHQ 2/9  Decreased Interest 0 0 0  Down, Depressed, Hopeless 1 0 0  PHQ - 2 Score 1 0 0  Altered sleeping 1 0 0  Tired, decreased energy 3 0 0  Change in appetite 2 0 0  Feeling bad or failure about yourself  1 0 0  Trouble concentrating 0 0 0  Moving slowly or fidgety/restless 0 0 0  Suicidal thoughts 0 0 0  PHQ-9 Score 8 0 0  Difficult doing work/chores Somewhat difficult Not difficult at all Not difficult at all  12/16/2019    2:25 PM 03/05/2019    9:26 AM  GAD 7 : Generalized Anxiety Score  Nervous, Anxious, on Edge 1 0  Control/stop worrying 3 0  Worry too much - different things 2 0  Trouble relaxing 2 0  Restless 1 0  Easily annoyed or irritable 2 0  Afraid - awful might happen 0 0  Total GAD 7 Score 11 0  Anxiety Difficulty Somewhat difficult Not difficult at all    -------------------------------------------------------------------------- O: No physical exam performed due to remote telephone encounter.  Lab results reviewed.  No results found for this or any previous visit (from the past 2160 hour(s)).  -------------------------------------------------------------------------- A&P:  Problem List Items Addressed This Visit   None Visit Diagnoses     Acute non-recurrent frontal sinusitis    -  Primary   Relevant Medications   amoxicillin-clavulanate (AUGMENTIN) 875-125 MG tablet   fluconazole (DIFLUCAN) 150 MG tablet   predniSONE (DELTASONE) 20 MG tablet   benzonatate (TESSALON) 200 MG capsule   ipratropium (ATROVENT) 0.06 % nasal spray   Antibiotic-induced yeast infection       Relevant Medications   fluconazole (DIFLUCAN) 150 MG tablet      Consistent with acute frontal sinusitis, likely  initially viral URI vs allergic rhinitis component with worsening concern for bacterial infection.  Duration 3 weeks   Plan: - Start Augmentin 875-'125mg'$  PO BID x 10 days - Prednisone taper due to bronchospasm cough Start Atrovent nasal spray decongestant 2 sprays in each nostril up to 4 times daily for 7 days Start Tessalon Perls take 1 capsule up to 3 times a day as needed for cough  Diflucan if needed for antibiotic yeast Return criteria reviewed  Meds ordered this encounter  Medications   amoxicillin-clavulanate (AUGMENTIN) 875-125 MG tablet    Sig: Take 1 tablet by mouth 2 (two) times daily.    Dispense:  20 tablet    Refill:  1   fluconazole (DIFLUCAN) 150 MG tablet    Sig: Take one tablet by mouth on Day 1. Repeat dose 2nd tablet on Day 3.    Dispense:  2 tablet    Refill:  1   predniSONE (DELTASONE) 20 MG tablet    Sig: Take daily with food. Start with '60mg'$  (3 pills) x 2 days, then reduce to '40mg'$  (2 pills) x 2 days, then '20mg'$  (1 pill) x 3 days    Dispense:  13 tablet    Refill:  0   benzonatate (TESSALON) 200 MG capsule    Sig: Take 1 capsule (200 mg total) by mouth 2 (two) times daily as needed for cough.    Dispense:  30 capsule    Refill:  0   ipratropium (ATROVENT) 0.06 % nasal spray    Sig: Place 2 sprays into both nostrils 4 (four) times daily. For up to 5-7 days then stop.    Dispense:  15 mL    Refill:  0    Follow-up: - Return in AS NEEDED  Patient verbalizes understanding with the above medical recommendations including the limitation of remote medical advice.  Specific follow-up and call-back criteria were given for patient to follow-up or seek medical care more urgently if needed.   - Time spent in direct consultation with patient on phone: 10 minutes   [name], [facility] 01/18/2022, 11:39 AM

## 2022-02-04 ENCOUNTER — Ambulatory Visit: Payer: Self-pay | Admitting: *Deleted

## 2022-02-04 ENCOUNTER — Telehealth: Payer: Self-pay | Admitting: Family Medicine

## 2022-02-04 NOTE — Telephone Encounter (Signed)
Patient is calling to report that she had a 4:00pm MyChart appt. Pt is unable to do MyChart and was waiting on a 4:00p call from Dr. Raliegh Ip. Attempted to call the office 4 times with no answer.

## 2022-02-04 NOTE — Telephone Encounter (Signed)
Message from Oneta Rack sent at 02/04/2022  2:23 PM EST  Summary: uncontrolled diarrhea   Patient experiencing uncontrolled diarrhea and belching.          Call History   Type Contact Phone/Fax User  02/04/2022 02:22 PM EST Phone (Incoming) Florencia, Zaccaro (Self) [phone] [place] [facility]   Reason for Disposition  SEVERE diarrhea (e.g., 7 or more times / day more than normal)    Unable to come into the office due to the diarrhea.  Answer Assessment - Initial Assessment Questions 1. ANTIBIOTIC: "What antibiotic are you taking?" "How many times per day?"     I'm having a lot of diarrhea yesterday and today.   No diarrhea last night and then I ate breakfast this morning and I've been having diarrhea today.   I was on Amoxicillin but finished it.  Also prednisone which I have finished.   I'm taking iron pills but I stopped them 2 days since my stomach and gut are so tore up.   I'm taking  Pepto Bismol.     My stool is black.  (I let her know Pepto Bismol does make your stool black). I messed in my britches at work so I'm on my way home now.   This is awful.   I can't stop going to the bathroom.   Everything is going right through me. Yesterday my stomach was hurting so bad around my belly button and today too. 2. ANTIBIOTIC ONSET: "When was the antibiotic started?"     I've been constipated for 3 weeks.   I have a chronic problem with that.   Then all of a sudden I'm having terrible diarrhea after having a hard stool yesterday. I've been done with the Amoxicillin.   Couldn't remember how many days ago. 3. DIARRHEA SEVERITY: "How bad is the diarrhea?" "How many more stools have you had in the past 24 hours than normal?"    - NO DIARRHEA (SCALE 0)   - MILD (SCALE 1-3): Few loose or mushy BMs; increase of 1-3 stools over normal daily number of stools; mild increase in ostomy output.   -  MODERATE (SCALE 4-7): Increase of 4-6 stools daily over normal; moderate increase in ostomy  output. * SEVERE (SCALE 8-10; OR 'WORST POSSIBLE'): Increase of 7 or more stools daily over normal; moderate increase in ostomy output; incontinence.     Severe 4. ONSET: "When did the diarrhea begin?"      Yesterday after having a hard stool then the diarrhea started.  5. BM CONSISTENCY: "How loose or watery is the diarrhea?"      The diarrhea is uncontrollable.   I'm usually so constipated.      6. VOMITING: "Are you also vomiting?" If Yes, ask: "How many times in the past 24 hours?"      One time 7. ABDOMEN PAIN: "Are you having any abdomen pain?" If Yes, ask: "What does it feel like?" (e.g., crampy, dull, intermittent, constant)      Yes around my belly button yesterday and today. 8. ABDOMEN PAIN SEVERITY: If present, ask: "How bad is the pain?"  (e.g., Scale 1-10; mild, moderate, or severe)   - MILD (1-3): doesn't interfere with normal activities, abdomen soft and not tender to touch    - MODERATE (4-7): interferes with normal activities or awakens from sleep, abdomen tender to touch    - SEVERE (8-10): excruciating pain, doubled over, unable to do any normal activities  Pain around my belly button.   Everything I eat and drink it goes through me. 9. ORAL INTAKE: If vomiting, "Have you been able to drink liquids?" "How much liquids have you had in the past 24 hours?"     Drinking Gator Aide now 10. HYDRATION: "Any signs of dehydration?" (e.g., dry mouth [not just dry lips], too weak to stand, dizziness, new weight loss) "When did you last urinate?"       I'm light headed 11. EXPOSURE: "Have you traveled to a foreign country recently?" "Have you been exposed to anyone with diarrhea?" "Could you have eaten any food that was spoiled?"       No 12. OTHER SYMPTOMS: "Do you have any other symptoms?" (e.g., fever, blood in stool)       Coughing a lot and congestion   13. PREGNANCY: "Is there any chance you are pregnant?" "When was your last menstrual period?"       N/A  Protocols used:  Diarrhea on Antibiotics-A-AH

## 2022-02-04 NOTE — Telephone Encounter (Signed)
  Chief Complaint: Uncontrollable diarrhea.  Had to come home from work today because I had an accident. Symptoms: Belching a lot, pain around my belly button and uncontrollable diarrhea Frequency: Started yesterday after she had a hard stool.   I'm usually constipated.   It's a chronic problem I have. She was on the Amoxicillin but has completed it now. Pertinent Negatives: Patient denies Fever. Disposition: '[]'$ ED /'[]'$ Urgent Care (no appt availability in office) / '[x]'$ Appointment(In office/virtual)/ '[]'$  [facility]/ '[]'$ Home Care/ '[]'$ Refused Recommended Disposition /'[]'$ [facility]/ '[]'$  Follow-up with PCP Additional Notes: Made her an appt. For today with Dr. Parks Ranger for 4:00.   The something is wrong with her phone and she's not able to do a video call so she needs Dr. Parks Ranger to call her instead.

## 2022-02-04 NOTE — Telephone Encounter (Signed)
There seems to be confusion with the appointment. The appt was never scheduled for today, it says it was scheduled for Monday, 1/29. I had received a separate message saying that the patient was calling because it was past four. I did speak with her and clarified what had happened. Dr. Raliegh Ip had another visit at that time therefore we were not able to talk with her as a visit today. He did speak with her briefly while I had her on the phone and advised her on some things and to go to [facility] or ED if symptoms got worse.

## 2022-02-05 ENCOUNTER — Other Ambulatory Visit: Payer: Self-pay

## 2022-02-05 ENCOUNTER — Emergency Department: Payer: Self-pay

## 2022-02-05 ENCOUNTER — Emergency Department
Admission: EM | Admit: 2022-02-05 | Discharge: 2022-02-05 | Disposition: A | Discharge status: Home / Self Care | Payer: Self-pay | Attending: Emergency Medicine | Admitting: Emergency Medicine

## 2022-02-05 DIAGNOSIS — R197 Diarrhea, unspecified: Secondary | ICD-10-CM | POA: Insufficient documentation

## 2022-02-05 DIAGNOSIS — R109 Unspecified abdominal pain: Secondary | ICD-10-CM | POA: Insufficient documentation

## 2022-02-05 DIAGNOSIS — I5020 Unspecified systolic (congestive) heart failure: Secondary | ICD-10-CM | POA: Insufficient documentation

## 2022-02-05 DIAGNOSIS — R110 Nausea: Secondary | ICD-10-CM | POA: Insufficient documentation

## 2022-02-05 DIAGNOSIS — I110 Hypertensive heart disease with heart failure: Secondary | ICD-10-CM | POA: Insufficient documentation

## 2022-02-05 DIAGNOSIS — D649 Anemia, unspecified: Secondary | ICD-10-CM | POA: Insufficient documentation

## 2022-02-05 LAB — URINALYSIS, ROUTINE W REFLEX MICROSCOPIC
Bilirubin Urine: NEGATIVE
Glucose, UA: NEGATIVE mg/dL
Hgb urine dipstick: NEGATIVE
Ketones, ur: NEGATIVE mg/dL
Leukocytes,Ua: NEGATIVE
Nitrite: NEGATIVE
Protein, ur: NEGATIVE mg/dL
Specific Gravity, Urine: 1.019 (ref 1.005–1.030)
pH: 5 (ref 5.0–8.0)

## 2022-02-05 LAB — LIPASE, BLOOD: Lipase: 28 U/L (ref 11–51)

## 2022-02-05 LAB — CBC
HCT: 35.8 % — ABNORMAL LOW (ref 36.0–46.0)
Hemoglobin: 11.6 g/dL — ABNORMAL LOW (ref 12.0–15.0)
MCH: 28.7 pg (ref 26.0–34.0)
MCHC: 32.4 g/dL (ref 30.0–36.0)
MCV: 88.6 fL (ref 80.0–100.0)
Platelets: 206 10*3/uL (ref 150–400)
RBC: 4.04 MIL/uL (ref 3.87–5.11)
RDW: 13.2 % (ref 11.5–15.5)
WBC: 7 10*3/uL (ref 4.0–10.5)
nRBC: 0 % (ref 0.0–0.2)

## 2022-02-05 LAB — COMPREHENSIVE METABOLIC PANEL
ALT: 21 U/L (ref 0–44)
AST: 28 U/L (ref 15–41)
Albumin: 3.5 g/dL (ref 3.5–5.0)
Alkaline Phosphatase: 66 U/L (ref 38–126)
Anion gap: 11 (ref 5–15)
BUN: 16 mg/dL (ref 6–20)
CO2: 22 mmol/L (ref 22–32)
Calcium: 8.1 mg/dL — ABNORMAL LOW (ref 8.9–10.3)
Chloride: 105 mmol/L (ref 98–111)
Creatinine, Ser: 0.84 mg/dL (ref 0.44–1.00)
GFR, Estimated: >60 mL/min (ref >60)
Glucose, Bld: 97 mg/dL (ref 70–99)
Potassium: 3.9 mmol/L (ref 3.5–5.1)
Sodium: 138 mmol/L (ref 135–145)
Total Bilirubin: 0.5 mg/dL (ref 0.3–1.2)
Total Protein: 6.6 g/dL (ref 6.5–8.1)

## 2022-02-05 MED ORDER — PANTOPRAZOLE SODIUM 40 MG IV SOLR
40.0000 mg | Freq: Once | INTRAVENOUS | Status: AC
  Administered 2022-02-05: 40 mg via INTRAVENOUS
  Filled 2022-02-05: qty 10

## 2022-02-05 MED ORDER — IOHEXOL 300 MG/ML  SOLN
100.0000 mL | Freq: Once | INTRAMUSCULAR | Status: AC | PRN
  Administered 2022-02-05: 100 mL via INTRAVENOUS

## 2022-02-05 MED ORDER — ONDANSETRON HCL 4 MG/2ML IJ SOLN
4.0000 mg | Freq: Once | INTRAMUSCULAR | Status: AC
  Administered 2022-02-05: 4 mg via INTRAVENOUS
  Filled 2022-02-05: qty 2

## 2022-02-05 MED ORDER — SODIUM CHLORIDE 0.9 % IV BOLUS: 1000.0000 mL | Freq: Once | INTRAVENOUS | Status: DC

## 2022-02-05 MED ORDER — SODIUM CHLORIDE 0.9 % IV BOLUS
500.0000 mL | Freq: Once | INTRAVENOUS | Status: AC
  Administered 2022-02-05: 500 mL via INTRAVENOUS

## 2022-02-05 NOTE — ED Triage Notes (Signed)
Pt reports diarrhea and abd pain for the past 3 days. Pt states she has been taking Pepto bismol and it is not helping and her stool is really dark and black now. Pt reports abd pain as well around her belly button.

## 2022-02-05 NOTE — ED Notes (Signed)
Pt rang on call bell to get up. Pt now on toilet for BM. Hat was placed in toilet to catch stool for provider to examine.

## 2022-02-05 NOTE — Discharge Instructions (Signed)
Drink plenty of fluids to stay well-hydrated.  Find Pedialyte or similar electrolyte rehydration formulas at your local pharmacy.  Stop taking nonsteroidal anti-inflammatories like Motrin, Advil, Aleve, ibuprofen.  If you have greater than 10 days of diarrhea, severe abdominal pain, fever or blood in the stools then come back to the emergency department for recheck.  Thank you for choosing [place] for your health care today!  Please see your primary doctor this week for a follow up appointment.   Sometimes, in the early stages of certain disease courses it is difficult to detect in the emergency department evaluation -- so, it is important that you continue to monitor your symptoms and call your doctor right away or return to the emergency department if you develop any new or worsening symptoms.  Please go to the following website to schedule new (and existing) patient appointments:   [email or web address]  If you do not have a primary doctor try calling the following clinics to establish care:  If you have insurance:  [facility] [phone] [place] [address]   [facility]  [phone] [address] [place] [address]   If you do not have insurance:  [facility]  [phone] [address] [place] [address]   The following is another list of primary care offices in the area who are accepting new patients at this time.  Please reach out to one of them directly and let them know you would like to schedule an appointment to follow up on an Emergency Department visit, and/or to establish a new primary care provider (PCP).  There are likely other primary care clinics in the are who are accepting new patients, but this is an excellent place to start:  [place] physician: Dr Lavon Paganini [address] [place], [place]  [address] [phone]  [facility] Lead Physician: Dr Steele Sizer [address], [place], [place] [address] [phone]  [place] Physician: Dr Park Liter [address] [place], [place] [address] [phone]  [facility] Lead Physician: Dr Dewaine Oats [place], [place], [place] [address] [phone]  [facility] [place] Physician: Dr Halina Maidens [address] Broach [place], [place] [address] [phone]   It was my pleasure to care for you today.   [place] Brunette Jacelyn Grip, MD

## 2022-02-05 NOTE — ED Notes (Signed)
Pt having urgency for BM. Pt has gotten up twice for BM with no stool coming out.

## 2022-02-05 NOTE — ED Notes (Signed)
Dr Wong at bedside

## 2022-02-05 NOTE — ED Provider Notes (Addendum)
[facility] Provider Note    Event Date/Time   First MD Initiated Contact with Patient 02/05/22 [phone]     (approximate)   History   Diarrhea   HPI  Shannon Small is a 62 y.o. female   Past medical history of heart failure with reduced ejection fraction, hypertension, nonischemic cardiomyopathy, acid reflux presents with 3 days of profuse watery diarrhea, nausea, indigestion.  No fever and no GI bleeding.  She finished a course of amoxicillin a couple weeks ago for sinus infection.  No hospitalizations or recent travel.  She has abdominal pain around her bellybutton.  No urinary symptoms.  She had some resolution transiently with Pepto-Bismol and other antidiarrheals, but recurred shortly thereafter.  Denies other acute medical complaints.   External Medical Documents Reviewed: Telephone encounters within the last 2 days with her primary doctor office describing diarrhea, abdominal pain.      Physical Exam   Triage Vital Signs: ED Triage Vitals  Enc Vitals Group     BP 02/05/22 0757 (!) 199/86     Pulse Rate 02/05/22 0757 100     Resp 02/05/22 0757 20     Temp 02/05/22 0757 98.1 F (36.7 C)     Temp Source 02/05/22 0757 Oral     SpO2 02/05/22 0757 97 %     Weight 02/05/22 0753 (!) 324 lb 1.2 oz (147 kg)     Height 02/05/22 0753 '5\' 5"'$  (1.651 m)     Head Circumference --      Peak Flow --      Pain Score 02/05/22 0753 8     Pain Loc --      Pain Edu? --      Excl. in [place]? --     Most recent vital signs: Vitals:   02/05/22 1139 02/05/22 1142  BP:    Pulse: 72   Resp:  18  Temp:  97.7 F (36.5 C)  SpO2: 100%     General: Awake, no distress.  CV:  Good peripheral perfusion.  Resp:  Normal effort.  Abd:  No distention.  Other:  Awake alert oriented comfortable appearing no acute distress normal hemodynamics abdomen abdomen which is soft without rigidity or guarding but she describes mild tenderness to deep palpation all quadrants.   Rectal exam showed no frank melena or blood but was Hemoccult positive.  The stool in the vault was dark in appearance and the patient states that she has been taking iron supplements and Pepto-Bismol in the color of her stool has been unchanged for months.   ED Results / Procedures / Treatments   Labs (all labs ordered are listed, but only abnormal results are displayed) Labs Reviewed  URINALYSIS, ROUTINE W REFLEX MICROSCOPIC - Abnormal; Notable for the following components:      Result Value   Color, Urine YELLOW (*)    APPearance HAZY (*)    All other components within normal limits  CBC - Abnormal; Notable for the following components:   Hemoglobin 11.6 (*)    HCT 35.8 (*)    All other components within normal limits  COMPREHENSIVE METABOLIC PANEL - Abnormal; Notable for the following components:   Calcium 8.1 (*)    All other components within normal limits  GASTROINTESTINAL PANEL BY PCR, STOOL (REPLACES STOOL CULTURE)  C DIFFICILE QUICK SCREEN W PCR REFLEX    LIPASE, BLOOD  POC OCCULT BLOOD, ED     I ordered and reviewed the above labs  they are notable for hemoglobin is 11.6 (12.1 2 yr ago)  and white blood cell count is normal.    RADIOLOGY I independently reviewed and interpreted CT scan of the abdomen pelvis and see no obvious obstructive or infectious patterns   PROCEDURES:  Critical Care performed: No  Procedures   MEDICATIONS ORDERED IN ED: Medications  ondansetron (ZOFRAN) injection 4 mg (4 mg Intravenous Given 02/05/22 0843)  pantoprazole (PROTONIX) injection 40 mg (40 mg Intravenous Given 02/05/22 0843)  sodium chloride 0.9 % bolus 500 mL (0 mLs Intravenous Stopped 02/05/22 0934)  iohexol (OMNIPAQUE) 300 MG/ML solution 100 mL (100 mLs Intravenous Contrast Given 02/05/22 0958)     IMPRESSION / MDM / ASSESSMENT AND PLAN / ED COURSE  I reviewed the triage vital signs and the nursing notes.                                Patient's presentation is most  consistent with acute presentation with potential threat to life or bodily function.  Differential diagnosis includes, but is not limited to, intra-abdominal infection, appendicitis, cholecystitis, colitis/gastroenteritis, dehydration electrolyte disturbance, urinary tract infection GI bleeding   The patient is on the cardiac monitor to evaluate for evidence of arrhythmia and/or significant heart rate changes.  MDM: This is a patient with profuse diarrhea over the course of several days with some abdominal pain will obtain CT scan of the abdomen pelvis to rule out surgical abdominal pathologies, give gentle fluid hydration 500 cc to start given her heart failure with reduced ejection fraction but that she does appear clinically slightly dehydrated today in the setting of profuse diarrhea.  Considered but less likely significant GI bleeding given normal hemodynamics and no frank blood or melena no anticoagulation but there was Hemoccult positive stool.  Will give Protonix, IV hydration, IV antiemetic and check basic labs and urinalysis in addition to her CT scan.   Add stool studies inc C dif given recent abx use.    I considered hospitalization for admission or observation but patient has had no further episodes of diarrhea while in the hospital during workup as above, stool studies not sent given no further episodes.  No fever, normotensive, and CT scan was negative for emergent pathologies so I think outpatient monitoring and follow-up is most proper at this time.   Considered GI bleed but Hgb/Hct similar to prior slightly lower today (she states she takes iron bc she is a frequent blood donor) w normal VS and no gross blood or melena on rectal exam I think this is less likely significant GI Bleeding. I advised her to dc her NSAID use for back pain and have close f/u with PMD to trend labs, and refer to GI as needed. She understands to return to the emergency department with any worsening symptoms  or new symptoms including GI bleeding, sx of dehydration or blood loss, ongoing diarrhea, fever, or pain.          FINAL CLINICAL IMPRESSION(S) / ED DIAGNOSES   Final diagnoses:  Diarrhea, unspecified type     Rx / DC Orders   ED Discharge Orders     None        Note:  This document was prepared using Dragon voice recognition software and may include unintentional dictation errors.    Lucillie Garfinkel, MD 02/05/22 1144    Lucillie Garfinkel, MD 02/05/22 [phone]

## 2022-02-06 ENCOUNTER — Telehealth: Payer: Self-pay | Admitting: Emergency Medicine

## 2022-02-06 NOTE — Telephone Encounter (Signed)
I gave Shannon Small a call this morning to check on her symptoms.  Fortunately her diarrhea has gotten a lot better, stool output has decreased significantly and is starting to become more formed.  Still has some residual abdominal cramping pain though mild.  I reminded her to follow-up with her primary doctor this week and have her blood levels rechecked given her mild microcytic anemia, and though it may be due to her recent blood transfusion is important to recheck this in case it continues to downtrend, and may need a referral to GI.  Reemphasized return precautions to the emergency department.  She expressed understanding.

## 2022-02-07 ENCOUNTER — Emergency Department
Admission: EM | Admit: 2022-02-07 | Discharge: 2022-02-07 | Disposition: A | Discharge status: Home / Self Care | Payer: Self-pay | Attending: Emergency Medicine | Admitting: Emergency Medicine

## 2022-02-07 ENCOUNTER — Telehealth: Payer: Self-pay | Admitting: Family Medicine

## 2022-02-07 ENCOUNTER — Other Ambulatory Visit: Payer: Self-pay

## 2022-02-07 DIAGNOSIS — E860 Dehydration: Secondary | ICD-10-CM | POA: Insufficient documentation

## 2022-02-07 DIAGNOSIS — R197 Diarrhea, unspecified: Secondary | ICD-10-CM | POA: Insufficient documentation

## 2022-02-07 DIAGNOSIS — R112 Nausea with vomiting, unspecified: Secondary | ICD-10-CM | POA: Insufficient documentation

## 2022-02-07 DIAGNOSIS — Z20822 Contact with and (suspected) exposure to covid-19: Secondary | ICD-10-CM | POA: Insufficient documentation

## 2022-02-07 DIAGNOSIS — R6883 Chills (without fever): Secondary | ICD-10-CM | POA: Insufficient documentation

## 2022-02-07 DIAGNOSIS — R000 Tachycardia, unspecified: Secondary | ICD-10-CM | POA: Insufficient documentation

## 2022-02-07 DIAGNOSIS — R1084 Generalized abdominal pain: Secondary | ICD-10-CM | POA: Insufficient documentation

## 2022-02-07 DIAGNOSIS — A0831 Calicivirus enteritis: Secondary | ICD-10-CM

## 2022-02-07 LAB — COMPREHENSIVE METABOLIC PANEL
ALT: 20 U/L (ref 0–44)
AST: 24 U/L (ref 15–41)
Albumin: 3.6 g/dL (ref 3.5–5.0)
Alkaline Phosphatase: 66 U/L (ref 38–126)
Anion gap: 10 (ref 5–15)
BUN: 13 mg/dL (ref 6–20)
CO2: 22 mmol/L (ref 22–32)
Calcium: 8.9 mg/dL (ref 8.9–10.3)
Chloride: 109 mmol/L (ref 98–111)
Creatinine, Ser: 0.79 mg/dL (ref 0.44–1.00)
GFR, Estimated: >60 mL/min (ref >60)
Glucose, Bld: 106 mg/dL — ABNORMAL HIGH (ref 70–99)
Potassium: 3.7 mmol/L (ref 3.5–5.1)
Sodium: 141 mmol/L (ref 135–145)
Total Bilirubin: 0.6 mg/dL (ref 0.3–1.2)
Total Protein: 7.2 g/dL (ref 6.5–8.1)

## 2022-02-07 LAB — RESP PANEL BY RT-PCR (RSV, FLU A&B, COVID)  RVPGX2
Influenza A by PCR: NEGATIVE
Influenza B by PCR: NEGATIVE
Resp Syncytial Virus by PCR: NEGATIVE
SARS Coronavirus 2 by RT PCR: NEGATIVE

## 2022-02-07 LAB — C DIFFICILE QUICK SCREEN W PCR REFLEX
C Diff antigen: NEGATIVE
C Diff interpretation: NOT DETECTED
C Diff toxin: NEGATIVE

## 2022-02-07 LAB — CBC WITH DIFFERENTIAL/PLATELET
Abs Immature Granulocytes: 0.06 10*3/uL (ref 0.00–0.07)
Basophils Absolute: 0 10*3/uL (ref 0.0–0.1)
Basophils Relative: 0 %
Eosinophils Absolute: 0.1 10*3/uL (ref 0.0–0.5)
Eosinophils Relative: 1 %
HCT: 39.1 % (ref 36.0–46.0)
Hemoglobin: 12.6 g/dL (ref 12.0–15.0)
Immature Granulocytes: 1 %
Lymphocytes Relative: 19 %
Lymphs Abs: 1.9 10*3/uL (ref 0.7–4.0)
MCH: 28.4 pg (ref 26.0–34.0)
MCHC: 32.2 g/dL (ref 30.0–36.0)
MCV: 88.1 fL (ref 80.0–100.0)
Monocytes Absolute: 0.6 10*3/uL (ref 0.1–1.0)
Monocytes Relative: 5 %
Neutro Abs: 7.7 10*3/uL (ref 1.7–7.7)
Neutrophils Relative %: 74 %
Platelets: 246 10*3/uL (ref 150–400)
RBC: 4.44 MIL/uL (ref 3.87–5.11)
RDW: 12.9 % (ref 11.5–15.5)
WBC: 10.4 10*3/uL (ref 4.0–10.5)
nRBC: 0 % (ref 0.0–0.2)

## 2022-02-07 LAB — GASTROINTESTINAL PANEL BY PCR, STOOL (REPLACES STOOL CULTURE)

## 2022-02-07 LAB — LIPASE, BLOOD: Lipase: 25 U/L (ref 11–51)

## 2022-02-07 MED ORDER — ACETAMINOPHEN 500 MG PO TABS
1000.0000 mg | ORAL_TABLET | Freq: Once | ORAL | Status: AC
  Administered 2022-02-07: 1000 mg via ORAL
  Filled 2022-02-07: qty 2

## 2022-02-07 MED ORDER — ONDANSETRON 4 MG PO TBDP: 4.0000 mg | ORAL_TABLET | Freq: Three times a day (TID) | ORAL | 0 refills | Status: AC | PRN

## 2022-02-07 MED ORDER — LOPERAMIDE HCL 2 MG PO CAPS
4.0000 mg | ORAL_CAPSULE | Freq: Once | ORAL | Status: AC
  Administered 2022-02-07: 4 mg via ORAL
  Filled 2022-02-07: qty 2

## 2022-02-07 MED ORDER — SODIUM CHLORIDE 0.9 % IV SOLN: Freq: Once | INTRAVENOUS | Status: AC

## 2022-02-07 MED ORDER — ONDANSETRON HCL 4 MG/2ML IJ SOLN
4.0000 mg | Freq: Once | INTRAMUSCULAR | Status: AC
  Administered 2022-02-07: 4 mg via INTRAVENOUS
  Filled 2022-02-07: qty 2

## 2022-02-07 NOTE — ED Triage Notes (Addendum)
Pt arrives from home via [facility].  C/O N/V/D x 5 days, leg cramps, and abdominal pain.  Pt believes that she has Cdiff d/t Amoxicillan intake.

## 2022-02-07 NOTE — ED Notes (Signed)
Pt tolerating liquids and able to swallow pills whole. PO challenge complete.

## 2022-02-07 NOTE — Discharge Instructions (Addendum)
Positive for saprovirus.   Perform hand hygiene frequently, especially before handling food or eating, and after using the toilet. Wash hands with liquid soap and water, and rub for at least 20 seconds; then rinse with water and dry with a disposable paper towel or hand dryer.   Stay quarantined at home.  Use Zofran to help with nausea.  You can take Tylenol 1 g every 8 hours to help with any pain.  Return to the ER if you develop worsening pain, blood in your stool, unable to tolerate eating or drinking or any other concerns

## 2022-02-07 NOTE — ED Provider Notes (Signed)
[facility] Provider Note    Event Date/Time   First MD Initiated Contact with Patient 02/07/22 [phone]     (approximate)   History   Diarrhea   HPI  Shannon Small is a 62 y.o. female who presents to the emergency department today via EMS because of concerns for continued and worsening diarrhea.  Patient was seen in the emergency department 2 days ago for diarrhea.  She had recently finished course of antibiotics so per provider's note there was concern for C. difficile.  However patient was unable to produce a stool sample during that emergency department stay.  She states that since leaving the ER she is had further episodes of diarrhea.  Additionally she has had some nausea and vomiting.  The diarrhea is consisted of brown stool. She says she has had chills.     Physical Exam   Triage Vital Signs: ED Triage Vitals  Enc Vitals Group     BP 02/07/22 0616 (!) 153/109     Pulse Rate 02/07/22 0616 (!) 107     Resp --      Temp 02/07/22 0616 100 F (37.8 C)     Temp Source 02/07/22 0616 Oral     SpO2 02/07/22 0616 97 %     Weight 02/07/22 0620 (!) 320 lb (145.2 kg)     Height 02/07/22 0620 '5\' 5"'$  (1.651 m)     Head Circumference --      Peak Flow --      Pain Score 02/07/22 0618 7     Pain Loc --      Pain Edu? --      Excl. in [place]? --     Most recent vital signs: Vitals:   02/07/22 0616  BP: (!) 153/109  Pulse: (!) 107  Temp: 100 F (37.8 C)  SpO2: 97%    General: Awake, alert, oriented. CV:  Good peripheral perfusion. Tachycardia. Resp:  Normal effort. Lungs clear. Abd:  No distention. Minimally diffusely tender to palpation.    ED Results / Procedures / Treatments   Labs (all labs ordered are listed, but only abnormal results are displayed) Labs Reviewed  GASTROINTESTINAL PANEL BY PCR, STOOL (REPLACES STOOL CULTURE)  C DIFFICILE QUICK SCREEN W PCR REFLEX    CBC WITH DIFFERENTIAL/PLATELET  COMPREHENSIVE METABOLIC PANEL  LIPASE,  BLOOD     EKG  None   RADIOLOGY None   PROCEDURES:  Critical Care performed: No  Procedures   MEDICATIONS ORDERED IN ED: Medications  0.9 %  sodium chloride infusion (has no administration in time range)     IMPRESSION / MDM / ASSESSMENT AND PLAN / ED COURSE  I reviewed the triage vital signs and the nursing notes.                              Differential diagnosis includes, but is not limited to, gastroenteritis, c dif  Patient's presentation is most consistent with acute presentation with potential threat to life or bodily function.  Patient presented to the emergency department today because of concerns for diarrhea.  Patient was seen for this 2 days ago.  At that time patient had blood work and CT scan.  Additionally GI studies were ordered however never sent given patient's inability to produce a stool sample.  Will repeat blood work today.  Given tachycardia do have concerns for slight dehydration.  Additionally will order stool studies.  At this time will give some fluids.    FINAL CLINICAL IMPRESSION(S) / ED DIAGNOSES   Final diagnoses:  Diarrhea, unspecified type  Dehydration     Note:  This document was prepared using Dragon voice recognition software and may include unintentional dictation errors.    Nance Pear, MD 02/07/22 [phone]

## 2022-02-07 NOTE — ED Provider Notes (Signed)
8:43 AM Assumed care for off going team.   Blood pressure (!) 163/81, pulse 88, temperature 100 F (37.8 C), temperature source Oral, resp. rate 18, height '5\' 5"'$  (1.651 m), weight (!) 145.2 kg, SpO2 98 %.  See their HPI for full report but in brief pending stool studies.    On repeat evaluation she reports that her pain is similar to when she had the CT scan 2 days ago.  Her white count was normal and we did discuss her positive test for Sapro  virus and how this is a virus that is self-limited and supportive treatment was recommended, good hand hygiene, and staying quarantined at home.  She does report eating shellfish prior to this episode.  We discussed repeat CT imaging but she reports that her pain is similar to prior and is not worsening.  Her abdomen is without any rebound or guarding her white count is normal doubt any evidence of abscess, obstruction, perforation.  At this time we have opted to hold off on repeat CT imaging given reassuring abdominal exam.  Patient is requesting something to help with diarrhea and says that she last took Imodium yesterday.  Will give a dose of Imodium here as well as some Zofran.  Patient is tolerating p.o.  Considered admission but given reassuring vital signs upon repeat heart rate has resolved and reassuring blood work without any electrolyte abnormalities or AKI patient understands plan to discharge home      Vanessa Noel, MD 02/07/22 [phone]

## 2022-02-07 NOTE — Telephone Encounter (Signed)
She was mistakenly told her appt was Friday when it was really Monday. We spoke with her Friday and got everything worked out and she spoke with Dr. Raliegh Ip.

## 2022-02-07 NOTE — ED Notes (Signed)
Pt ride will be here in 30 mins, waiting in her room d/t sapovirus.

## 2022-02-28 ENCOUNTER — Ambulatory Visit: Payer: Self-pay | Admitting: *Deleted

## 2022-02-28 DIAGNOSIS — J040 Acute laryngitis: Secondary | ICD-10-CM

## 2022-02-28 DIAGNOSIS — R051 Acute cough: Secondary | ICD-10-CM

## 2022-02-28 MED ORDER — PREDNISONE 20 MG PO TABS: ORAL_TABLET | ORAL | 0 refills | Status: DC

## 2022-02-28 NOTE — Telephone Encounter (Signed)
Sent Prednisone to [facility] 02/28/2022, 9:12 AM

## 2022-02-28 NOTE — Addendum Note (Signed)
Addended by: Olin Hauser on: 02/28/2022 09:12 AM   Modules accepted: Orders

## 2022-02-28 NOTE — Telephone Encounter (Signed)
  Chief Complaint: Very hoarse voice, almost gone Symptoms: above Frequency: For the last 4 days.   Was treated for a sinus infection 01/18/2022.   That is much better but now she is requesting a steroid be called in to open her airways so her voice will come back.   This has happened before and Dr. Parks Ranger calls in a steroid. Pertinent Negatives: Patient denies Fever, sore throat or other symptoms of the sinus infection. Disposition: []$ ED /[]$ Urgent Care (no appt availability in office) / []$ Appointment(In office/virtual)/ []$  [place] [facility]/ []$ Home Care/ []$ Refused Recommended Disposition /[]$ [place] [facility]/ [x]$  Follow-up with PCP Additional Notes: Message sent to Dr. Parks Ranger with her request for a steroid.   Call into [facility] on [address]

## 2022-02-28 NOTE — Telephone Encounter (Signed)
Message from Luciana Axe sent at 02/28/2022  8:08 AM EST  Summary: laringitis Advice   Pt is calling to report that she was seen in office for Acute non-recurrent frontal sinusitis on 01/18/22. Pt was prescribed amoxicillin-clavulanate (AUGMENTIN) 875-125 MG tablet [id number]. And a refill was given. Pt reports that her voice has been gone for 4 days. Pt has gargled with no relief. Please advise          Call History   Type Contact Phone/Fax User  02/28/2022 08:06 AM EST Phone (Incoming) Shannon Small, Shannon Small (Self) [phone] Lemmie Evens) Blase Mess A   Reason for Disposition  [1] MODERATE to SEVERE hoarseness (e.g., interferes with work) AND [2] professional voice user (e.g., call center worker, singer, Pharmacist, hospital)  (Exception: Current common cold or mild URI symptoms.)    Treated for a sinus infection.  It's much better but voice is almost gone for the last 4 days.   Requesting a steroid be called in.  This has happened before.  Answer Assessment - Initial Assessment Questions 1. DESCRIPTION: "Describe your voice." (e.g., coarse, raspy, weaker, airy, scratchy, deeper)    My voice is very hoarse for the last 4 days.    I'm coughing up clear mucus.   My throat is mildly sore.     I'm at work.   I need something to get my voice back.   Dr. Parks Ranger usually calls in a steroid for me to open my airways and my voice will come back.    I've had this happen before and he would just call in a steroid for me.  2. SEVERITY: "How bad is it?"   - MILD: doesn't interfere with normal activities   - MODERATE: interferes with normal activities such as school or work   - SEVERE: only able to whisper.     Moderate. Severe.    Voice very scratchy and almost a whisper.    She is at work today. 3. ONSET: "When did the hoarseness begin?"     4 days ago.    The sinus infection I was treated for has gotten much better.   It's just my voice is gone. 4. COUGH: "Is there a cough?" If Yes, ask: "How bad is it?"      Mild cough with only clear mucus coming up. 5. FEVER: "Do you have a fever?" If Yes, ask: "What is your temperature, how was it measured, and when did it start?"     No 6. ALLERGIES: "Any allergy symptoms?" If Yes, ask: "What are they?"     Not asked 7. IRRITANTS: "Do you smoke?" "Have you been exposed to any irritating fumes?" (e.g., smoke)     Not asked 8. CAUSE: "What do you think is causing the hoarseness?"     My airways need to be opened with the steroids.   This has happened to me before.   9. OTHER SYMPTOMS: "Do you have any other symptoms?" (e.g., breathing difficulty, fever, foreign body, lymph node swelling in neck, rash, sore throat, weight loss)     Throat s a little scratchy but not sore. 10. PREGNANCY: "Is there any chance you are pregnant?" "When was your last menstrual period?"       N/A due to age  Protocols used: [facility]

## 2022-03-16 ENCOUNTER — Ambulatory Visit: Payer: Self-pay | Admitting: *Deleted

## 2022-03-16 NOTE — Telephone Encounter (Signed)
Patient called and she is coming in tomorrow morning for an appt.

## 2022-03-16 NOTE — Telephone Encounter (Signed)
loss of voice   Per agent: "Pt called in and stated she has been sick over a month and she lost her voice/ it came back and now she has lost it again / pt can not get rid of the hoarseness / she is asking what to do / she stated she has no money for an appt / please advise"      Chief Complaint: Hoarse voice Symptoms: Voice hoarse, difficult to understand. Reports RX steroids, "Better for 4-5 days now back." Also reports cough , clear phlegm and sore throat.  Frequency: Off and on "Over a month." Pertinent Negatives: Patient denies fever Disposition: '[]'$ ED /'[]'$ Urgent Care (no appt availability in office) / '[]'$ Appointment(In office/virtual)/ '[]'$  [place] [facility]/ '[]'$ Home Care/ '[]'$ Refused Recommended Disposition /'[]'$  Mobile Bus/ '[x]'$  Follow-up with PCP Additional Notes: Pt declines appt. Requesting steroids called in "I don't think I was on them long enough." Also requesting another round of Amoxicillin. States does not have money for appt. Please advise. Reason for Disposition  [1] Hoarseness starting > 24 hours ago AND [2] taking an ACE Inhibitor medicine (e.g., benazepril / LOTENSIN, captopril / CAPOTEN, enalapril / VASOTEC, lisinopril / ZESTRIL)  Answer Assessment - Initial Assessment Questions 1. DESCRIPTION: "Describe your voice." (e.g., coarse, raspy, weaker, airy, scratchy, deeper)     Hoarse 2. SEVERITY: "How bad is it?"   - MILD: doesn't interfere with normal activities   - MODERATE: interferes with normal activities such as school or work   - SEVERE: only able to whisper.     severe 3. ONSET: "When did the hoarseness begin?"     1 month, off and on 4. COUGH: "Is there a cough?" If Yes, ask: "How bad is it?"     Cough, clear 5. FEVER: "Do you have a fever?" If Yes, ask: "What is your temperature, how was it measured, and when did it start?"     no 6. ALLERGIES: "Any allergy symptoms?" If Yes, ask: "What are they?"      7. IRRITANTS: "Do you smoke?" "Have you been  exposed to any irritating fumes?" (e.g., smoke)     no 8. CAUSE: "What do you think is causing the hoarseness?"     "Was coughing a lot" 9. OTHER SYMPTOMS: "Do you have any other symptoms?" (e.g., breathing difficulty, fever, foreign body, lymph node swelling in neck, rash, sore throat, weight loss)     Sore throat, cough, clear phlegm.  Protocols used: [facility]

## 2022-03-17 ENCOUNTER — Ambulatory Visit: Payer: Self-pay | Admitting: Family Medicine

## 2022-03-18 ENCOUNTER — Ambulatory Visit (INDEPENDENT_AMBULATORY_CARE_PROVIDER_SITE_OTHER): Payer: Self-pay | Admitting: Family Medicine

## 2022-03-18 ENCOUNTER — Encounter: Payer: Self-pay | Admitting: Family Medicine

## 2022-03-18 DIAGNOSIS — J0110 Acute frontal sinusitis, unspecified: Secondary | ICD-10-CM

## 2022-03-18 DIAGNOSIS — B379 Candidiasis, unspecified: Secondary | ICD-10-CM

## 2022-03-18 DIAGNOSIS — J9801 Acute bronchospasm: Secondary | ICD-10-CM

## 2022-03-18 MED ORDER — FLUCONAZOLE 150 MG PO TABS: ORAL_TABLET | ORAL | 1 refills | Status: DC

## 2022-03-18 MED ORDER — AMOXICILLIN-POT CLAVULANATE 875-125 MG PO TABS: 1.0000 | ORAL_TABLET | Freq: Two times a day (BID) | ORAL | 0 refills | Status: DC

## 2022-03-18 MED ORDER — GUAIFENESIN-CODEINE 100-10 MG/5ML PO SYRP: 5.0000 mL | ORAL_SOLUTION | Freq: Four times a day (QID) | ORAL | 0 refills | Status: DC | PRN

## 2022-03-18 MED ORDER — ALBUTEROL SULFATE HFA 108 (90 BASE) MCG/ACT IN AERS: 2.0000 | INHALATION_SPRAY | RESPIRATORY_TRACT | 2 refills | Status: DC | PRN

## 2022-03-18 MED ORDER — PREDNISONE 20 MG PO TABS: ORAL_TABLET | ORAL | 0 refills | Status: DC

## 2022-03-18 NOTE — Patient Instructions (Addendum)
Thank you for coming to the office today.  Likely Sinusitis infection with laryngitis hoarse voice  Start medication Augmentin antibiotic and Prednisone course for swelling Do not take Ibuprofen while on Prednisone, may take it after you finish Diflucan for possible yeast infection after if need Cough syrup w codeine if need Albuterol inhaler as needed  Future may need Chest X-ray, referral to ENT or Pulm  Please schedule a Follow-up Appointment to: Return if symptoms worsen or fail to improve.  If you have any other questions or concerns, please feel free to call the office or send a message through [place]. You may also schedule an earlier appointment if necessary.  Additionally, you may be receiving a survey about your experience at our office within a few days to 1 week by e-mail or mail. We value your feedback.  [name], DO [place]

## 2022-03-18 NOTE — Progress Notes (Signed)
Subjective:    Patient ID: Shannon Small, female    DOB: [birth year], 61 y.o.   MRN: [id number]  Shannon Small is a 62 y.o. female presenting on 03/18/2022 for Hoarse and Cough  Self pay  HPI  Laryngitis / Sinusitis / Cough History of recurrent symptoms episodic throughout the past year. Recently flared up 1 week ago with sinus drainage congestion and coughing, and persistent laryngitis symptoms Negative home COVID     03/18/2022   11:11 AM 12/16/2019    2:25 PM 03/05/2019    9:24 AM  Depression screen PHQ 2/9  Decreased Interest 0 0 0  Down, Depressed, Hopeless 1 1 0  PHQ - 2 Score 1 1 0  Altered sleeping 0 1 0  Tired, decreased energy 2 3 0  Change in appetite 0 2 0  Feeling bad or failure about yourself  0 1 0  Trouble concentrating 0 0 0  Moving slowly or fidgety/restless 0 0 0  Suicidal thoughts 0 0 0  PHQ-9 Score 3 8 0  Difficult doing work/chores Not difficult at all Somewhat difficult Not difficult at all    Social History   Tobacco Use   Smoking status: Never   Smokeless tobacco: Never  Vaping Use   Vaping Use: Never used  Substance Use Topics   Alcohol use: Yes    Alcohol/week: 0.0 standard drinks of alcohol    Comment: rarely   Drug use: No    Review of Systems Per HPI unless specifically indicated above     Objective:    BP 128/84   Pulse 92   Ht '5\' 5"'$  (1.651 m)   Wt (!) 324 lb (147 kg)   SpO2 100%   BMI 53.92 kg/m   Wt Readings from Last 3 Encounters:  03/18/22 (!) 324 lb (147 kg)  02/07/22 (!) 320 lb (145.2 kg)  02/05/22 (!) 324 lb 1.2 oz (147 kg)    Physical Exam Vitals and nursing note reviewed.  Constitutional:      General: She is not in acute distress.    Appearance: She is well-developed. She is obese. She is not diaphoretic.     Comments: Well-appearing, comfortable, cooperative  HENT:     Head: Normocephalic and atraumatic.     Mouth/Throat:     Comments: Hoarse voice Eyes:     General:        Right eye: No discharge.         Left eye: No discharge.     Conjunctiva/sclera: Conjunctivae normal.  Neck:     Thyroid: No thyromegaly.  Cardiovascular:     Rate and Rhythm: Normal rate and regular rhythm.     Heart sounds: Normal heart sounds. No murmur heard. Pulmonary:     Effort: Pulmonary effort is normal. No respiratory distress.     Breath sounds: Normal breath sounds. No wheezing or rales.  Musculoskeletal:        General: Normal range of motion.     Cervical back: Normal range of motion and neck supple.  Lymphadenopathy:     Cervical: No cervical adenopathy.  Skin:    General: Skin is warm and dry.     Findings: No erythema or rash.  Neurological:     Mental Status: She is alert and oriented to person, place, and time.  Psychiatric:        Behavior: Behavior normal.     Comments: Well groomed, good eye contact, normal speech and thoughts  Results for orders placed or performed during the hospital encounter of 02/07/22  Gastrointestinal Panel by PCR , Stool   Specimen: Stool  Result Value Ref Range   Campylobacter species NOT DETECTED NOT DETECTED   Plesimonas shigelloides NOT DETECTED NOT DETECTED   Salmonella species NOT DETECTED NOT DETECTED   Yersinia enterocolitica NOT DETECTED NOT DETECTED   Vibrio species NOT DETECTED NOT DETECTED   Vibrio cholerae NOT DETECTED NOT DETECTED   Enteroaggregative E coli (EAEC) NOT DETECTED NOT DETECTED   Enteropathogenic E coli (EPEC) NOT DETECTED NOT DETECTED   Enterotoxigenic E coli (ETEC) NOT DETECTED NOT DETECTED   Shiga like toxin producing E coli (STEC) NOT DETECTED NOT DETECTED   Shigella/Enteroinvasive E coli (EIEC) NOT DETECTED NOT DETECTED   Cryptosporidium NOT DETECTED NOT DETECTED   Cyclospora cayetanensis NOT DETECTED NOT DETECTED   Entamoeba histolytica NOT DETECTED NOT DETECTED   Giardia lamblia NOT DETECTED NOT DETECTED   Adenovirus F40/41 NOT DETECTED NOT DETECTED   Astrovirus NOT DETECTED NOT DETECTED   Norovirus GI/GII NOT  DETECTED NOT DETECTED   Rotavirus A NOT DETECTED NOT DETECTED   Sapovirus (I, II, IV, and V) DETECTED (A) NOT DETECTED  C Difficile Quick Screen w PCR reflex   Specimen: Stool  Result Value Ref Range   C Diff antigen NEGATIVE NEGATIVE   C Diff toxin NEGATIVE NEGATIVE   C Diff interpretation No C. difficile detected.   Resp panel by RT-PCR (RSV, Flu A&B, Covid) Anterior Nasal Swab   Specimen: Anterior Nasal Swab  Result Value Ref Range   SARS Coronavirus 2 by RT PCR NEGATIVE NEGATIVE   Influenza A by PCR NEGATIVE NEGATIVE   Influenza B by PCR NEGATIVE NEGATIVE   Resp Syncytial Virus by PCR NEGATIVE NEGATIVE  CBC with Differential  Result Value Ref Range   WBC 10.4 4.0 - 10.5 K/uL   RBC 4.44 3.87 - 5.11 MIL/uL   Hemoglobin 12.6 12.0 - 15.0 g/dL   HCT 39.1 36.0 - 46.0 %   MCV 88.1 80.0 - 100.0 fL   MCH 28.4 26.0 - 34.0 pg   MCHC 32.2 30.0 - 36.0 g/dL   RDW 12.9 11.5 - 15.5 %   Platelets 246 150 - 400 K/uL   nRBC 0.0 0.0 - 0.2 %   Neutrophils Relative % 74 %   Neutro Abs 7.7 1.7 - 7.7 K/uL   Lymphocytes Relative 19 %   Lymphs Abs 1.9 0.7 - 4.0 K/uL   Monocytes Relative 5 %   Monocytes Absolute 0.6 0.1 - 1.0 K/uL   Eosinophils Relative 1 %   Eosinophils Absolute 0.1 0.0 - 0.5 K/uL   Basophils Relative 0 %   Basophils Absolute 0.0 0.0 - 0.1 K/uL   Immature Granulocytes 1 %   Abs Immature Granulocytes 0.06 0.00 - 0.07 K/uL  Comprehensive metabolic panel  Result Value Ref Range   Sodium 141 135 - 145 mmol/L   Potassium 3.7 3.5 - 5.1 mmol/L   Chloride 109 98 - 111 mmol/L   CO2 22 22 - 32 mmol/L   Glucose, Bld 106 (H) 70 - 99 mg/dL   BUN 13 6 - 20 mg/dL   Creatinine, Ser 0.79 0.44 - 1.00 mg/dL   Calcium 8.9 8.9 - 10.3 mg/dL   Total Protein 7.2 6.5 - 8.1 g/dL   Albumin 3.6 3.5 - 5.0 g/dL   AST 24 15 - 41 U/L   ALT 20 0 - 44 U/L   Alkaline Phosphatase 66 38 -  126 U/L   Total Bilirubin 0.6 0.3 - 1.2 mg/dL   GFR, Estimated >60 >60 mL/min   Anion gap 10 5 - 15  Lipase,  blood  Result Value Ref Range   Lipase 25 11 - 51 U/L      Assessment & Plan:   Problem List Items Addressed This Visit     Cough due to bronchospasm   Relevant Medications   albuterol (VENTOLIN HFA) 108 (90 Base) MCG/ACT inhaler   predniSONE (DELTASONE) 20 MG tablet   guaiFENesin-codeine (ROBITUSSIN AC) 100-10 MG/5ML syrup   Other Visit Diagnoses     Acute non-recurrent frontal sinusitis       Relevant Medications   amoxicillin-clavulanate (AUGMENTIN) 875-125 MG tablet   fluconazole (DIFLUCAN) 150 MG tablet   predniSONE (DELTASONE) 20 MG tablet   guaiFENesin-codeine (ROBITUSSIN AC) 100-10 MG/5ML syrup   Antibiotic-induced yeast infection       Relevant Medications   fluconazole (DIFLUCAN) 150 MG tablet       Likely Sinusitis infection with laryngitis hoarse voice Recurrent infection, seems to have repeat Sinusitis episodes  Note we discussed she is self pay and no insurance. She is not meeting criteria for medicaid, and not medicare at age 59. We discussed that in future I would have recommended referral to ENT or Pulmonology however   Start medication Augmentin antibiotic and Prednisone course for swelling Do not take Ibuprofen while on Prednisone, may take it after you finish Diflucan for possible yeast infection after if need Cough syrup w codeine if need Albuterol inhaler as needed  Future may need Chest X-ray, referral to ENT or Pulm  Meds ordered this encounter  Medications   albuterol (VENTOLIN HFA) 108 (90 Base) MCG/ACT inhaler    Sig: Inhale 2 puffs into the lungs every 4 (four) hours as needed for wheezing or shortness of breath (cough).    Dispense:  6.7 g    Refill:  2   amoxicillin-clavulanate (AUGMENTIN) 875-125 MG tablet    Sig: Take 1 tablet by mouth 2 (two) times daily.    Dispense:  20 tablet    Refill:  0   fluconazole (DIFLUCAN) 150 MG tablet    Sig: Take one tablet by mouth on Day 1. Repeat dose 2nd tablet on Day 3.    Dispense:  2 tablet     Refill:  1   predniSONE (DELTASONE) 20 MG tablet    Sig: Take daily with food. Start with '60mg'$  (3 pills) x 2 days, then reduce to '40mg'$  (2 pills) x 2 days, then '20mg'$  (1 pill) x 3 days    Dispense:  13 tablet    Refill:  0   guaiFENesin-codeine (ROBITUSSIN AC) 100-10 MG/5ML syrup    Sig: Take 5-10 mLs by mouth 4 (four) times daily as needed for cough.    Dispense:  180 mL    Refill:  0      Follow up plan: Return if symptoms worsen or fail to improve.   [name], [facility] 03/18/2022, 11:40 AM

## 2022-05-05 ENCOUNTER — Ambulatory Visit: Payer: Self-pay | Admitting: *Deleted

## 2022-05-05 NOTE — Telephone Encounter (Signed)
  Chief Complaint: coughing SOB with coughing lightheadedness Symptoms: see above. Fever 2 days ago none now. On last dose of amoxicillin, inhaler not as effective. Gave plasma donation yesterday .  Frequency: today  Pertinent Negatives: Patient denies difficulty breathing no fever now.  Disposition: ED /[x] Urgent Care (no appt availability in office) / Appointment(In office/virtual)/  [place] [facility]/ Home Care/ Refused Recommended Disposition /[]  Mobile Bus/  Follow-up with PCP Additional Notes:   Recommended UC patient does not want to go . Recommended increase fluid intake due to plasma donation yesterday . Called [facility] to review possible appt and continue to advise UC today.  Please advise if patient should continue to donate plasma tomorrow. Recommended to wait until no issues with cough or SOB.     Reason for Disposition  [1] MILD difficulty breathing (e.g., minimal/no SOB at rest, SOB with walking, pulse <100) AND [2] still present when not coughing  Answer Assessment - Initial Assessment Questions 1. ONSET: "When did the cough begin?"      Ongoing taking amoxicillin for sx 2. SEVERITY: "How bad is the cough today?"      Cough and SOB with coughing lightheaded 3. SPUTUM: "Describe the color of your sputum" (none, dry cough; clear, white, yellow, green)     na 4. HEMOPTYSIS: "Are you coughing up any blood?" If so ask: "How much?" (flecks, streaks, tablespoons, etc.)     na 5. DIFFICULTY BREATHING: "Are you having difficulty breathing?" If Yes, ask: "How bad is it?" (e.g., mild, moderate, severe)    - MILD: No SOB at rest, mild SOB with walking, speaks normally in sentences, can lie down, no retractions, pulse < 100.    - MODERATE: SOB at rest, SOB with minimal exertion and prefers to sit, cannot lie down flat, speaks in phrases, mild retractions, audible wheezing, pulse 100-120.    - SEVERE: Very SOB at rest, speaks in single words, struggling to  breathe, sitting hunched forward, retractions, pulse > 120      Lightheaded esp with coughing  6. FEVER: "Do you have a fever?" If Yes, ask: "What is your temperature, how was it measured, and when did it start?"     Not now was 2 days ago  7. CARDIAC HISTORY: "Do you have any history of heart disease?" (e.g., heart attack, congestive heart failure)      See hx  8. LUNG HISTORY: "Do you have any history of lung disease?"  (e.g., pulmonary embolus, asthma, emphysema)     See hx  9. PE RISK FACTORS: "Do you have a history of blood clots?" (or: recent major surgery, recent prolonged travel, bedridden)     na 10. OTHER SYMPTOMS: "Do you have any other symptoms?" (e.g., runny nose, wheezing, chest pain)       Cough , shortness of breath with coughing and causes dizziness lightheaded 11. PREGNANCY: "Is there any chance you are pregnant?" "When was your last menstrual period?"       na 12. TRAVEL: "Have you traveled out of the country in the last month?" (e.g., travel history, exposures)       na  Protocols used: Cough - Acute Productive-A-AH

## 2022-05-06 NOTE — Telephone Encounter (Signed)
She can try [facility]. Regarding plasma donation, that is up to her ultimately and the facility. I would assume if you arrive with fever or respiratory illness symptoms they may not draw plasma regardless. Likely a good idea to wait until she is feeling better.  Saralyn Pilar, DO [facility] 05/06/2022, 8:57 AM

## 2022-05-06 NOTE — Telephone Encounter (Signed)
Spoke with patient. She states she is feeling somewhat better, is no longer dizzy. She has a plasma appt on Tues, will see how she feels on Mon and will reschedule if not improved. Nothing further needed at this time.

## 2022-05-10 ENCOUNTER — Ambulatory Visit: Payer: Self-pay | Admitting: Family Medicine

## 2022-09-01 ENCOUNTER — Ambulatory Visit: Payer: Self-pay

## 2022-09-01 NOTE — Telephone Encounter (Signed)
  Chief Complaint: Muscle cramps Symptoms: muscle cramps  Frequency: 1.5 years Pertinent Negatives: Patient denies dark urine, stain use Disposition: [] ED /[] Urgent Care (no appt availability in office) / [x] Appointment(In office/virtual)/ []  [place] [facility]/ [] Home Care/ [] Refused Recommended Disposition /[] [place] [facility]/ []  Follow-up with PCP Additional Notes: Pt states that she has bad muscle cramping in her body. She starts working next week. Scheduled appt for weds, first availble that works with her schedule.   Summary: cramps   Pt called saying she has been having body cramps in various places.  She wanted to see Dr. Kirtland Bouchard asap but he has nothing until Sept/  please advise [phone]     Reason for Disposition  [1] MODERATE pain (e.g., interferes with normal activities) AND [2] present > 3 days  Answer Assessment - Initial Assessment Questions 1. ONSET: "When did the muscle aches or body pains start?"      1.5 yeats 2. LOCATION: "What part of your body is hurting?" (e.g., entire body, arms, legs)      Hand cramps, fingers, feet, legs 3. SEVERITY: "How bad is the pain?" (Scale 1-10; or mild, moderate, severe)   - MILD (1-3): doesn't interfere with normal activities    - MODERATE (4-7): interferes with normal activities or awakens from sleep    - SEVERE (8-10):  excruciating pain, unable to do any normal activities      severe 4. CAUSE: "What do you think is causing the pains?"     no 5. FEVER: "Have you been having fever?"     no 6. OTHER SYMPTOMS: "Do you have any other symptoms?" (e.g., chest pain, weakness, rash, cold or flu symptoms, weight loss)     no  Protocols used: Muscle Aches and Body Pain-A-AH

## 2022-09-07 ENCOUNTER — Ambulatory Visit: Payer: Self-pay | Admitting: Internal Medicine

## 2022-09-13 ENCOUNTER — Ambulatory Visit: Payer: Self-pay | Admitting: Cardiovascular Disease

## 2022-09-26 ENCOUNTER — Ambulatory Visit: Payer: Self-pay

## 2022-09-26 NOTE — Telephone Encounter (Signed)
Chief Complaint: Iron Question  Symptoms: None Disposition: [] ED /[] Urgent Care (no appt availability in office) / [] Appointment(In office/virtual)/ []  [place] [facility]/ [x] Home Care/ [] Refused Recommended Disposition /[] [place] [facility]/ []  Follow-up with PCP Additional Notes: Patient called to ask what foods are high in iron. Patient is taking iron supplements and was told that her iron level was on the low side of normal when she went to donate plasma. Patient was given information related to food rich in iron. Asked patient if she wanted to schedule an appointment with PCP to discuss iron levels and patient declined.   Summary: Iron Advice   Pt is calling to ask what would make her iron go down. Pt reports that she is taking iron supplements. Please advise     Reason for Disposition  General information question, no triage required and triager able to answer question  Answer Assessment - Initial Assessment Questions 1. REASON FOR CALL or QUESTION: "What is your reason for calling today?" or "How can I best help you?" or "What question do you have that I can help answer?"     What are some foods that are high in iron that I can eat.  Protocols used: Information Only Call - No Triage-A-AH

## 2022-10-26 ENCOUNTER — Telehealth (INDEPENDENT_AMBULATORY_CARE_PROVIDER_SITE_OTHER): Payer: Self-pay | Admitting: Family Medicine

## 2022-10-26 ENCOUNTER — Encounter: Payer: Self-pay | Admitting: Family Medicine

## 2022-10-26 DIAGNOSIS — B379 Candidiasis, unspecified: Secondary | ICD-10-CM

## 2022-10-26 DIAGNOSIS — J9801 Acute bronchospasm: Secondary | ICD-10-CM

## 2022-10-26 DIAGNOSIS — J0110 Acute frontal sinusitis, unspecified: Secondary | ICD-10-CM

## 2022-10-26 MED ORDER — GUAIFENESIN-CODEINE 100-10 MG/5ML PO SYRP: 5.0000 mL | ORAL_SOLUTION | Freq: Four times a day (QID) | ORAL | 0 refills | Status: DC | PRN

## 2022-10-26 MED ORDER — ALBUTEROL SULFATE HFA 108 (90 BASE) MCG/ACT IN AERS: 2.0000 | INHALATION_SPRAY | RESPIRATORY_TRACT | 2 refills | Status: DC | PRN

## 2022-10-26 MED ORDER — AMOXICILLIN-POT CLAVULANATE 875-125 MG PO TABS: 1.0000 | ORAL_TABLET | Freq: Two times a day (BID) | ORAL | 1 refills | Status: DC

## 2022-10-26 MED ORDER — FLUCONAZOLE 150 MG PO TABS: ORAL_TABLET | ORAL | 1 refills | Status: DC

## 2022-10-26 MED ORDER — PREDNISONE 20 MG PO TABS: ORAL_TABLET | ORAL | 0 refills | Status: DC

## 2022-10-26 NOTE — Patient Instructions (Addendum)

## 2022-10-26 NOTE — Progress Notes (Signed)
Subjective:    Patient ID: Shannon Small, female    DOB: Nov 01, 1960, 62 y.o.   MRN: [id number]  Shannon Small is a 62 y.o. female presenting on 10/26/2022 for Sinusitis  Patient presents for a same day appointment.  Virtual / Telehealth Encounter - Video Visit via MyChart The purpose of this virtual visit is to provide medical care while limiting exposure to the novel coronavirus (COVID19) for both patient and office staff.  Consent was obtained for remote visit:  Yes.   Answered questions that patient had about telehealth interaction:  Yes.   I discussed the limitations, risks, security and privacy concerns of performing an evaluation and management service by video/telephone. I also discussed with the patient that there may be a patient responsible charge related to this service. The patient expressed understanding and agreed to proceed.  Patient Location: Home Provider Location: [facility] (Office)  Participants in virtual visit: - Patient: Shannon Small - CMA: Shirley Muscat CMA - Provider: Dr Althea Charon   HPI  Discussed the use of AI scribe software for clinical note transcription with the patient, who gave verbal consent to proceed.  Sinusitis    The patient presented with a chief complaint of sinus congestion and productive cough that began last week. The sinus congestion was described as thick with green and brown drainage. The patient has been coughing up and blowing out the drainage. No associated fevers or chills were reported. The patient has been in contact with others who have been ill, suggesting a possible infectious etiology.  The patient's symptoms are similar to a previous episode in March for which they were treated with Augmentin, a steroid, and codeine cough syrup. The patient requested the same treatment regimen, indicating that the previous treatment was effective in managing their symptoms.       03/18/2022   11:11 AM 12/16/2019    2:25 PM  03/05/2019    9:24 AM  Depression screen PHQ 2/9  Decreased Interest 0 0 0  Down, Depressed, Hopeless 1 1 0  PHQ - 2 Score 1 1 0  Altered sleeping 0 1 0  Tired, decreased energy 2 3 0  Change in appetite 0 2 0  Feeling bad or failure about yourself  0 1 0  Trouble concentrating 0 0 0  Moving slowly or fidgety/restless 0 0 0  Suicidal thoughts 0 0 0  PHQ-9 Score 3 8 0  Difficult doing work/chores Not difficult at all Somewhat difficult Not difficult at all    Social History   Tobacco Use   Smoking status: Never   Smokeless tobacco: Never  Vaping Use   Vaping status: Never Used  Substance Use Topics   Alcohol use: Yes    Alcohol/week: 0.0 standard drinks of alcohol    Comment: rarely   Drug use: No    Review of Systems Per HPI unless specifically indicated above     Objective:    There were no vitals taken for this visit.  Wt Readings from Last 3 Encounters:  03/18/22 (!) 324 lb (147 kg)  02/07/22 (!) 320 lb (145.2 kg)  02/05/22 (!) 324 lb 1.2 oz (147 kg)    Physical Exam  Note examination was completely remotely via video observation objective data only  Gen - well-appearing, no acute distress or apparent pain, comfortable HEENT - eyes appear clear without discharge or redness Heart/Lungs - cannot examine virtually - observed no evidence of coughing or labored breathing. Abd - cannot  examine virtually  Skin - face visible today- no rash Neuro - awake, alert, oriented Psych - not anxious appearing  Results for orders placed or performed during the hospital encounter of 02/07/22  Gastrointestinal Panel by PCR , Stool   Specimen: Stool  Result Value Ref Range   Campylobacter species NOT DETECTED NOT DETECTED   Plesimonas shigelloides NOT DETECTED NOT DETECTED   Salmonella species NOT DETECTED NOT DETECTED   Yersinia enterocolitica NOT DETECTED NOT DETECTED   Vibrio species NOT DETECTED NOT DETECTED   Vibrio cholerae NOT DETECTED NOT DETECTED    Enteroaggregative E coli (EAEC) NOT DETECTED NOT DETECTED   Enteropathogenic E coli (EPEC) NOT DETECTED NOT DETECTED   Enterotoxigenic E coli (ETEC) NOT DETECTED NOT DETECTED   Shiga like toxin producing E coli (STEC) NOT DETECTED NOT DETECTED   Shigella/Enteroinvasive E coli (EIEC) NOT DETECTED NOT DETECTED   Cryptosporidium NOT DETECTED NOT DETECTED   Cyclospora cayetanensis NOT DETECTED NOT DETECTED   Entamoeba histolytica NOT DETECTED NOT DETECTED   Giardia lamblia NOT DETECTED NOT DETECTED   Adenovirus F40/41 NOT DETECTED NOT DETECTED   Astrovirus NOT DETECTED NOT DETECTED   Norovirus GI/GII NOT DETECTED NOT DETECTED   Rotavirus A NOT DETECTED NOT DETECTED   Sapovirus (I, II, IV, and V) DETECTED (A) NOT DETECTED  C Difficile Quick Screen w PCR reflex   Specimen: Stool  Result Value Ref Range   C Diff antigen NEGATIVE NEGATIVE   C Diff toxin NEGATIVE NEGATIVE   C Diff interpretation No C. difficile detected.   Resp panel by RT-PCR (RSV, Flu A&B, Covid) Anterior Nasal Swab   Specimen: Anterior Nasal Swab  Result Value Ref Range   SARS Coronavirus 2 by RT PCR NEGATIVE NEGATIVE   Influenza A by PCR NEGATIVE NEGATIVE   Influenza B by PCR NEGATIVE NEGATIVE   Resp Syncytial Virus by PCR NEGATIVE NEGATIVE  CBC with Differential  Result Value Ref Range   WBC 10.4 4.0 - 10.5 K/uL   RBC 4.44 3.87 - 5.11 MIL/uL   Hemoglobin 12.6 12.0 - 15.0 g/dL   HCT 60.1 09.3 - 23.5 %   MCV 88.1 80.0 - 100.0 fL   MCH 28.4 26.0 - 34.0 pg   MCHC 32.2 30.0 - 36.0 g/dL   RDW 57.3 22.0 - 25.4 %   Platelets 246 150 - 400 K/uL   nRBC 0.0 0.0 - 0.2 %   Neutrophils Relative % 74 %   Neutro Abs 7.7 1.7 - 7.7 K/uL   Lymphocytes Relative 19 %   Lymphs Abs 1.9 0.7 - 4.0 K/uL   Monocytes Relative 5 %   Monocytes Absolute 0.6 0.1 - 1.0 K/uL   Eosinophils Relative 1 %   Eosinophils Absolute 0.1 0.0 - 0.5 K/uL   Basophils Relative 0 %   Basophils Absolute 0.0 0.0 - 0.1 K/uL   Immature Granulocytes 1 %    Abs Immature Granulocytes 0.06 0.00 - 0.07 K/uL  Comprehensive metabolic panel  Result Value Ref Range   Sodium 141 135 - 145 mmol/L   Potassium 3.7 3.5 - 5.1 mmol/L   Chloride 109 98 - 111 mmol/L   CO2 22 22 - 32 mmol/L   Glucose, Bld 106 (H) 70 - 99 mg/dL   BUN 13 6 - 20 mg/dL   Creatinine, Ser 2.70 0.44 - 1.00 mg/dL   Calcium 8.9 8.9 - 62.3 mg/dL   Total Protein 7.2 6.5 - 8.1 g/dL   Albumin 3.6 3.5 - 5.0 g/dL  AST 24 15 - 41 U/L   ALT 20 0 - 44 U/L   Alkaline Phosphatase 66 38 - 126 U/L   Total Bilirubin 0.6 0.3 - 1.2 mg/dL   GFR, Estimated >95 >62 mL/min   Anion gap 10 5 - 15  Lipase, blood  Result Value Ref Range   Lipase 25 11 - 51 U/L      Assessment & Plan:   Problem List Items Addressed This Visit     Cough due to bronchospasm   Relevant Medications   predniSONE (DELTASONE) 20 MG tablet   guaiFENesin-codeine (ROBITUSSIN AC) 100-10 MG/5ML syrup   albuterol (VENTOLIN HFA) 108 (90 Base) MCG/ACT inhaler   Other Visit Diagnoses     Acute non-recurrent frontal sinusitis    -  Primary   Relevant Medications   amoxicillin-clavulanate (AUGMENTIN) 875-125 MG tablet   predniSONE (DELTASONE) 20 MG tablet   guaiFENesin-codeine (ROBITUSSIN AC) 100-10 MG/5ML syrup   fluconazole (DIFLUCAN) 150 MG tablet   Antibiotic-induced yeast infection       Relevant Medications   fluconazole (DIFLUCAN) 150 MG tablet       Assessment and Plan    Sinusitis Thick sinus congestion and drainage, productive cough, and sick contacts. No fever or chills. Last treated in March. -Order Augmentin 875mg , steroid prednisone taper, and codeine cough syrup. -Order anti-yeast pill as a precautionary measure. -Check inhaler and reorder if necessary.  Follow-up as needed.        Meds ordered this encounter  Medications   amoxicillin-clavulanate (AUGMENTIN) 875-125 MG tablet    Sig: Take 1 tablet by mouth 2 (two) times daily.    Dispense:  20 tablet    Refill:  1   predniSONE  (DELTASONE) 20 MG tablet    Sig: Take daily with food. Start with 60mg  (3 pills) x 2 days, then reduce to 40mg  (2 pills) x 2 days, then 20mg  (1 pill) x 3 days    Dispense:  13 tablet    Refill:  0   guaiFENesin-codeine (ROBITUSSIN AC) 100-10 MG/5ML syrup    Sig: Take 5-10 mLs by mouth 4 (four) times daily as needed for cough.    Dispense:  180 mL    Refill:  0   fluconazole (DIFLUCAN) 150 MG tablet    Sig: Take one tablet by mouth on Day 1. Repeat dose 2nd tablet on Day 3.    Dispense:  2 tablet    Refill:  1   albuterol (VENTOLIN HFA) 108 (90 Base) MCG/ACT inhaler    Sig: Inhale 2 puffs into the lungs every 4 (four) hours as needed for wheezing or shortness of breath (cough).    Dispense:  6.7 g    Refill:  2      Follow up plan: Return if symptoms worsen or fail to improve.   Patient verbalizes understanding with the above medical recommendations including the limitation of remote medical advice.  Specific follow-up and call-back criteria were given for patient to follow-up or seek medical care more urgently if needed.  Total duration of direct patient care provided via video conference: 6 minutes   Saralyn Pilar, DO [facility] 10/26/2022, 11:24 AM

## 2022-11-01 ENCOUNTER — Ambulatory Visit: Payer: Self-pay | Admitting: *Deleted

## 2022-11-01 DIAGNOSIS — J0110 Acute frontal sinusitis, unspecified: Secondary | ICD-10-CM

## 2022-11-01 MED ORDER — IPRATROPIUM BROMIDE 0.06 % NA SOLN: 2.0000 | Freq: Four times a day (QID) | NASAL | 0 refills | Status: DC | PRN

## 2022-11-01 NOTE — Telephone Encounter (Signed)
Summary: Pt requests Rx for antibiotic   Pt stated she is experiencing severe headaches due to her sinuses and she would like to request a Rx for antibiotic spray. Cb# [phone]     Reason for Disposition . [1] Taking antibiotic > 72 hours (3 days) AND [2] sinus pain not improved  Answer Assessment - Initial Assessment Questions 1. ANTIBIOTIC: "What antibiotic are you taking?" "How many times a day?"     Amoxicillin - patient state sshe has one dose left 2. ONSET: "When was the antibiotic started?"     VV 10/16 3. PAIN: "How bad is the sinus pain?"   (Scale 1-10; mild, moderate or severe)   - MILD (1-3): doesn't interfere with normal activities    - MODERATE (4-7): interferes with normal activities (e.g., work or school) or awakens from sleep   - SEVERE (8-10): excruciating pain and patient unable to do any normal activities        Sinus pressure-severe- 8/10, headache 4. FEVER: "Do you have a fever?" If Yes, ask: "What is it, how was it measured, and when did it start?"      no 5. SYMPTOMS: "Are there any other symptoms you're concerned about?" If Yes, ask: "When did it start?"     Sinus pressure present- headache and pressure in sinus- patient states all in nasal passages. Patient is able to clear nasal drainage- still greenish/yellow  Protocols used: Sinus Infection on Antibiotic Follow-up Call-A-AH

## 2022-11-01 NOTE — Telephone Encounter (Signed)
  Chief Complaint: Sinus pressure still present Symptoms: Patient was seen VV 10/16- treated:  amoxicillin-clavulanate (AUGMENTIN) 875-125 MG tablet     predniSONE (DELTASONE) 20 MG tablet    guaiFENesin-codeine (ROBITUSSIN AC) 100-10 MG/5ML syrup    fluconazole (DIFLUCAN) 150 MG tablet   Frequency: Patient states she has one dose of antibiotic left- and still has severe sinus pressure Pertinent Negatives: Patient denies fever Disposition: [] ED /[] Urgent Care (no appt availability in office) / [] Appointment(In office/virtual)/ []  [facility] [place] [facility]/ [] Home Care/ [] Refused Recommended Disposition /[] [place] [facility]/ [x]  Follow-up with PCP Additional Notes: Patient is requesting another nasal spray to help relieve the sinus pressure and pain. Patient advised I would send message to provider for next steps in treatment.

## 2022-11-01 NOTE — Telephone Encounter (Signed)
Patient notified and verbalized understanding. 

## 2022-11-01 NOTE — Telephone Encounter (Signed)
Please notify patient  Rx sent for Atrovent decongestant spray  Start Atrovent nasal spray decongestant 2 sprays in each nostril up to 4 times daily for 7 days  If antibiotic is not resolving symptoms, she could have more issues with allergy / congestion / rhinitis, and not infection. It is possible.  I would suggest this decongestant spray first, and if this is ineffective, then we would consider allergy spray with Azelastine.  Let me know if questions.  Saralyn Pilar, DO [facility] 11/01/2022, 12:31 PM

## 2022-11-01 NOTE — Addendum Note (Signed)
Addended by: Smitty Cords on: 11/01/2022 12:32 PM   Modules accepted: Orders

## 2022-11-17 ENCOUNTER — Other Ambulatory Visit: Payer: Self-pay | Admitting: Family Medicine

## 2022-11-17 DIAGNOSIS — L309 Dermatitis, unspecified: Secondary | ICD-10-CM

## 2022-11-17 DIAGNOSIS — S39012A Strain of muscle, fascia and tendon of lower back, initial encounter: Secondary | ICD-10-CM

## 2022-11-17 DIAGNOSIS — I872 Venous insufficiency (chronic) (peripheral): Secondary | ICD-10-CM

## 2022-11-17 DIAGNOSIS — B372 Candidiasis of skin and nail: Secondary | ICD-10-CM

## 2022-11-17 DIAGNOSIS — M62830 Muscle spasm of back: Secondary | ICD-10-CM

## 2022-11-17 NOTE — Telephone Encounter (Signed)
Medication Refill -  Most Recent Primary Care Visit:  Provider: Smitty Cords  Department: [facility]  Visit Type: MYCHART VIDEO VISIT  Date: 10/26/2022  Medication: orphenadrine (NORFLEX) 100 MG tablet ketoconazole (NIZORAL) 2 % cream  triamcinolone cream (KENALOG) 0.5 %  hydrocortisone 2.5 % cream  Has the patient contacted their pharmacy? Yes (Agent: If no, request that the patient contact the pharmacy for the refill. If patient does not wish to contact the pharmacy document the reason why and proceed with request.) (Agent: If yes, when and what did the pharmacy advise?)  Is this the correct pharmacy for this prescription? Yes If no, delete pharmacy and type the correct one.  This is the patient's preferred pharmacy:  [facility] [address] ([address]), [place] - [address] [place]) [place] [address] Phone: [phone] Fax: [phone]   Has the prescription been filled recently? No  Is the patient out of the medication? Yes  Has the patient been seen for an appointment in the last year OR does the patient have an upcoming appointment? Yes  Can we respond through MyChart? No  Agent: Please be advised that Rx refills may take up to 3 business days. We ask that you follow-up with your pharmacy.

## 2022-11-17 NOTE — Telephone Encounter (Signed)
Requested medication (s) are due for refill today: yes  Requested medication (s) are on the active medication list: yes  Last refill:  11/12/21  Future visit scheduled: no  Notes to clinic:  Unable to refill per protocol, cannot delegate.      Requested Prescriptions  Pending Prescriptions Disp Refills   orphenadrine (NORFLEX) 100 MG tablet [Pharmacy Med Name: Orphenadrine Citrate ER 100 MG Oral Tablet Extended Release 12 Hour] 30 tablet 0    Sig: TAKE 1 TABLET BY MOUTH TWICE DAILY AS NEEDED FOR MUSCLE SPASM     Not Delegated - Analgesics:  Muscle Relaxants Failed - 11/17/2022  9:08 AM      Failed - This refill cannot be delegated      Passed - Valid encounter within last 6 months    Recent Outpatient Visits           3 weeks ago Acute non-recurrent frontal sinusitis   [place] [facility] [place], Netta Neat, DO   8 months ago Cough due to bronchospasm   [facility] DO   10 months ago Acute non-recurrent frontal sinusitis   [facility]ords, DO   1 year ago Acute non-recurrent frontal sinusitis   [place] [facility] Smitty Cords, DO   1 year ago Acute non-recurrent frontal sinusitis   [place] [facility] [place], Netta Neat, [place]

## 2022-11-18 MED ORDER — KETOCONAZOLE 2 % EX CREA: 1.0000 | TOPICAL_CREAM | Freq: Every day | CUTANEOUS | 2 refills | Status: DC | PRN

## 2022-11-18 MED ORDER — TRIAMCINOLONE ACETONIDE 0.5 % EX CREA: TOPICAL_CREAM | CUTANEOUS | 2 refills | Status: DC

## 2022-11-18 MED ORDER — ORPHENADRINE CITRATE ER 100 MG PO TB12: ORAL_TABLET | ORAL | 2 refills | Status: DC

## 2022-11-18 MED ORDER — HYDROCORTISONE 2.5 % EX CREA: TOPICAL_CREAM | Freq: Two times a day (BID) | CUTANEOUS | 2 refills | Status: DC

## 2022-11-18 NOTE — Telephone Encounter (Signed)
Requested medication (s) are due for refill today:   Provider to review    All of these are outdated.  Requested medication (s) are on the active medication list:   Yes some from as far back as 2020  Future visit scheduled:   No     LOV 03/18/2022     Has multiple cancellations   Last ordered: Varied and most are non delegated.  Non delegated refills and older rx along with multiple cancellations reason returned.      Requested Prescriptions  Pending Prescriptions Disp Refills   orphenadrine (NORFLEX) 100 MG tablet 60 tablet 2    Sig: TAKE 1 TABLET BY MOUTH TWICE DAILY AS NEEDED FOR MUSCLE SPASM     Not Delegated - Analgesics:  Muscle Relaxants Failed - 11/17/2022  1:06 PM      Failed - This refill cannot be delegated      Passed - Valid encounter within last 6 months    Recent Outpatient Visits           3 weeks ago Acute non-recurrent frontal sinusitis   [facility], Netta Neat, DO   8 months ago Cough due to bronchospasm   [facility] Smitty Cords, DO   10 months ago Acute non-recurrent frontal sinusitis   [place] Smitty Cords, DO   1 year ago Acute non-recurrent frontal sinusitis   [facility] Smitty Cords, DO   1 year ago Acute non-recurrent frontal sinusitis   [place] [facility] [place], Netta Neat, DO               ketoconazole (NIZORAL) 2 % cream 30 g 2    Sig: Apply 1 Application topically daily as needed for irritation.     Not Delegated - Over the Counter: OTC 2 Failed - 11/17/2022  1:06 PM      Failed - This refill cannot be delegated      Passed - Valid encounter within last 12 months    Recent Outpatient Visits           3 weeks ago Acute non-recurrent frontal sinusitis   [place] [facility] [place], Netta Neat, DO   8 months ago Cough due  to bronchospasm   [facility] Smit[facility] Cords, DO   10 months ago Acute non-recurrent frontal sinusitis   [facility] Smitty Cords, DO   1 year ago Acute non-recurrent frontal sinusitis   [place] [facility] Smit[facility] Cords, DO   1 year ago Acute non-recurrent frontal sinusitis   [place] [facility], Netta Neat, DO               triamcinolone cream (KENALOG) 0.5 % 30 g 2    Sig: APPLY CREAM TOPICALLY TO AFFECTED AREA TWICE DAILY FOR UP TO 2 WEEKS     Not Delegated - Dermatology:  Corticosteroids Failed - 11/17/2022  1:06 PM      Failed - This refill cannot be delegated      Passed - Valid encounter within last 12 months    Recent Outpatient Visits           3 weeks ago Acute non-recurrent frontal sinusitis   [facility] Smitty Cords, DO   8 months  ago Cough due to bronchospasm   [facility] S[facility], DO   10 months ago Acute non-recurrent frontal sinusitis   [place] [facility] Smitty Cords, DO   1 year ago Acute non-recurrent frontal sinusitis   [facility] Smitty Cords, DO   1 year ago Acute non-recurrent frontal sinusitis   [place] [facility] Smitty Cords, DO               hydrocortisone 2.5 % cream 28 g 2     Off-Protocol Failed - 11/17/2022  1:06 PM      Failed - Medication not assigned to a protocol, review manually.      Passed - Valid encounter within last 12 months    Recent Outpatient Visits           3 weeks ago Acute non-recurrent frontal sinusitis   [facility] [place], Netta Neat, DO   8 months ago Cough due to bronchospasm   [facility] Smitty Cords, DO    10 months ago Acute non-recurrent frontal sinusitis   [facility] Smitty Cords, DO   1 year ago Acute non-recurrent frontal sinusitis   [place] Smitty Cords, DO   1 year ago Acute non-recurrent frontal sinusitis    [facility] [place], Netta Neat, [place]

## 2022-12-01 ENCOUNTER — Other Ambulatory Visit: Payer: Self-pay | Admitting: Family Medicine

## 2022-12-01 DIAGNOSIS — S39012A Strain of muscle, fascia and tendon of lower back, initial encounter: Secondary | ICD-10-CM

## 2022-12-01 DIAGNOSIS — M62830 Muscle spasm of back: Secondary | ICD-10-CM

## 2022-12-02 NOTE — Telephone Encounter (Signed)
Requested Prescriptions  Pending Prescriptions Disp Refills   ibuprofen (ADVIL) 800 MG tablet [Pharmacy Med Name: Ibuprofen 800 MG Oral Tablet] 90 tablet 0    Sig: TAKE 1 TABLET BY MOUTH EVERY 8 HOURS AS NEEDED FOR MODERATE PAIN     Analgesics:  NSAIDS Failed - 12/01/2022  4:54 PM      Failed - Manual Review: Labs are only required if the patient has taken medication for more than 8 weeks.      Passed - Cr in normal range and within 360 days    Creat  Date Value Ref Range Status  03/05/2019 0.63 0.50 - 1.05 mg/dL Final    Comment:    For patients >12 years of age, the reference limit for Creatinine is approximately 13% higher for people identified as African-American. .    Creatinine, Ser  Date Value Ref Range Status  02/07/2022 0.79 0.44 - 1.00 mg/dL Final         Passed - HGB in normal range and within 360 days    Hemoglobin  Date Value Ref Range Status  02/07/2022 12.6 12.0 - 15.0 g/dL Final  16/10/9602 54.0 11.1 - 15.9 g/dL Final         Passed - PLT in normal range and within 360 days    Platelets  Date Value Ref Range Status  02/07/2022 246 150 - 400 K/uL Final  02/11/2020 228 150 - 450 x10E3/uL Final         Passed - HCT in normal range and within 360 days    HCT  Date Value Ref Range Status  02/07/2022 39.1 36.0 - 46.0 % Final   Hematocrit  Date Value Ref Range Status  02/11/2020 41.6 34.0 - 46.6 % Final         Passed - eGFR is 30 or above and within 360 days    GFR, Est African American  Date Value Ref Range Status  03/05/2019 115 > OR = 60 mL/min/1.11m2 Final   GFR calc Af Amer  Date Value Ref Range Status  02/11/2020 103 >59 mL/min/1.73 Final    Comment:    **In accordance with recommendations from the NKF-ASN Task force,**   [facility] is in the process of updating its eGFR calculation to the   2021 CKD-EPI creatinine equation that estimates kidney function   without a race variable.    GFR, Est Non African American  Date Value Ref Range  Status  03/05/2019 99 > OR = 60 mL/min/1.46m2 Final   GFR, Estimated  Date Value Ref Range Status  02/07/2022 >60 >60 mL/min Final    Comment:    (NOTE) Calculated using the CKD-EPI Creatinine Equation (2021)          Passed - Patient is not pregnant      Passed - Valid encounter within last 12 months    Recent Outpatient Visits           1 month ago Acute non-recurrent frontal sinusitis   [facility] [place], Netta Neat, DO   8 months ago Cough due to bronchospasm   [facility] Smit[facility], DO   10 months ago Acute non-recurrent frontal sinusitis   [place] [facility] Smitty [facility]ords, DO   1 year ago Acute non-recurrent frontal sinusitis   [place] [place], Netta Neat, DO   1 year ago Acute non-recurrent frontal sinusitis   [facility] [place], Netta Neat, DO

## 2022-12-14 ENCOUNTER — Other Ambulatory Visit: Payer: Self-pay | Admitting: Family Medicine

## 2022-12-14 DIAGNOSIS — I10 Essential (primary) hypertension: Secondary | ICD-10-CM

## 2022-12-14 NOTE — Telephone Encounter (Signed)
Medication Refill -  Most Recent Primary Care Visit:  Provider: Smitty Cords  Department: [facility]  Visit Type: MYCHART VIDEO VISIT  Date: 10/26/2022  Medication:  metoprolol succinate (TOPROL-XL) 25 MG 24 hr tablet  Lisinopril 40 MG  spironolactone (ALDACTONE) 25 MG tablet  Has the patient contacted their pharmacy? Yes, advised to contact PCP  Is this the correct pharmacy for this prescription? Yes, the one listed below  This is the patient's preferred pharmacy:  [facility] [address]), [place] - [address] [place]) [place] [address] Phone: [phone] Fax: [phone]   Has the prescription been filled recently? No  Is the patient out of the medication? *PATIENT HAS A FEW LEFT, NEEDS THEM  BY FRIDAY  Has the patient been seen for an appointment in the last year OR does the patient have an upcoming appointment?  Patient was last seen by MyChart Video on 10/26/22

## 2022-12-15 ENCOUNTER — Telehealth: Payer: Self-pay | Admitting: *Deleted

## 2022-12-15 ENCOUNTER — Telehealth: Payer: Self-pay | Admitting: Family Medicine

## 2022-12-15 DIAGNOSIS — I10 Essential (primary) hypertension: Secondary | ICD-10-CM

## 2022-12-15 MED ORDER — METOPROLOL SUCCINATE ER 25 MG PO TB24: 25.0000 mg | ORAL_TABLET | Freq: Every day | ORAL | 0 refills | Status: DC

## 2022-12-15 NOTE — Telephone Encounter (Signed)
Requested medication (s) are due for refill today: yes  Requested medication (s) are on the active medication list: yes  Last refill:  11/12/21 #90/1 for both lisinopril and spironolactone  Future visit scheduled: no  Notes to clinic:  Unable to refill per protocol due to failed labs, no updated results.      Requested Prescriptions  Pending Prescriptions Disp Refills   lisinopril (ZESTRIL) 40 MG tablet 90 tablet 1    Sig: Take 1 tablet (40 mg total) by mouth daily.     Cardiovascular:  ACE Inhibitors Failed - 12/14/2022  2:13 PM      Failed - Cr in normal range and within 180 days    Creat  Date Value Ref Range Status  03/05/2019 0.63 0.50 - 1.05 mg/dL Final    Comment:    For patients >39 years of age, the reference limit for Creatinine is approximately 13% higher for people identified as African-American. .    Creatinine, Ser  Date Value Ref Range Status  02/07/2022 0.79 0.44 - 1.00 mg/dL Final         Failed - K in normal range and within 180 days    Potassium  Date Value Ref Range Status  02/07/2022 3.7 3.5 - 5.1 mmol/L Final  07/30/2013 3.7 3.5 - 5.1 mmol/L Final         Passed - Patient is not pregnant      Passed - Last BP in normal range    BP Readings from Last 1 Encounters:  03/18/22 128/84         Passed - Valid encounter within last 6 months    Recent Outpatient Visits           1 month ago Acute non-recurrent frontal sinusitis   [place] [facility] S[facility], DO   9 months ago Cough due to bronchospasm   [facility] S[facility], DO   11 months ago Acute non-recurrent frontal sinusitis   [place] [facility] S[facility], DO   1 year ago Acute non-recurrent frontal sinusitis   [facility] [place], Netta Neat, DO   1 year ago Acute non-recurrent frontal sinusitis   [place] [facility]  [place], Netta Neat, DO               spironolactone (ALDACTONE) 25 MG tablet 90 tablet 1    Sig: Take 1 tablet (25 mg total) by mouth daily.     Cardiovascular: Diuretics - Aldosterone Antagonist Failed - 12/14/2022  2:13 PM      Failed - Cr in normal range and within 180 days    Creat  Date Value Ref Range Status  03/05/2019 0.63 0.50 - 1.05 mg/dL Final    Comment:    For patients >75 years of age, the reference limit for Creatinine is approximately 13% higher for people identified as African-American. .    Creatinine, Ser  Date Value Ref Range Status  02/07/2022 0.79 0.44 - 1.00 mg/dL Final         Failed - K in normal range and within 180 days    Potassium  Date Value Ref Range Status  02/07/2022 3.7 3.5 - 5.1 mmol/L Final  07/30/2013 3.7 3.5 - 5.1 mmol/L Final         Failed - Na in normal range and within 180 days    Sodium  Date Value Ref  Range Status  02/07/2022 141 135 - 145 mmol/L Final  02/11/2020 137 134 - 144 mmol/L Final  07/30/2013 140 136 - 145 mmol/L Final         Failed - eGFR is 30 or above and within 180 days    GFR, Est African American  Date Value Ref Range Status  03/05/2019 115 > OR = 60 mL/min/1.54m2 Final   GFR calc Af Amer  Date Value Ref Range Status  02/11/2020 103 >59 mL/min/1.73 Final    Comment:    **In accordance with recommendations from the NKF-ASN Task force,**   [facility] is in the process of updating its eGFR calculation to the   2021 CKD-EPI creatinine equation that estimates kidney function   without a race variable.    GFR, Est Non African American  Date Value Ref Range Status  03/05/2019 99 > OR = 60 mL/min/1.69m2 Final   GFR, Estimated  Date Value Ref Range Status  02/07/2022 >60 >60 mL/min Final    Comment:    (NOTE) Calculated using the CKD-EPI Creatinine Equation (2021)          Passed - Last BP in normal range    BP Readings from Last 1 Encounters:  03/18/22 128/84         Passed - Valid  encounter within last 6 months    Recent Outpatient Visits           1 month ago Acute non-recurrent frontal sinusitis   [place] [place], Netta Neat, DO   9 months ago Cough due to bronchospasm   [facility] Smit[facility] Cords, DO   11 months ago Acute non-recurrent frontal sinusitis   [facility] S[facility] Cords, DO   1 year ago Acute non-recurrent frontal sinusitis   [facility] [place], Netta Neat, DO   1 year ago Acute non-recurrent frontal sinusitis   [place] [facility], Netta Neat, DO              Signed Prescriptions Disp Refills   metoprolol succinate (TOPROL-XL) 25 MG 24 hr tablet 90 tablet 0    Sig: Take 1 tablet (25 mg total) by mouth daily.     Cardiovascular:  Beta Blockers Passed - 12/14/2022  2:13 PM      Passed - Last BP in normal range    BP Readings from Last 1 Encounters:  03/18/22 128/84         Passed - Last Heart Rate in normal range    Pulse Readings from Last 1 Encounters:  03/18/22 92         Passed - Valid encounter within last 6 months    Recent Outpatient Visits           1 month ago Acute non-recurrent frontal sinusitis   [facility] [place], Netta Neat, DO   9 months ago Cough due to bronchospasm   [facility] Smit[facility] C[facility] DO   11 months ago Acute non-recurrent frontal sinusitis   [facility] Smit[facility] Cords, DO   1 year ago Acute non-recurrent frontal sinusitis   [facility] Smit[facility] C[facility], DO   1 year ago Acute non-recurrent frontal sinusitis   [place] [facility] [place], Netta Neat, [place]

## 2022-12-15 NOTE — Telephone Encounter (Signed)
Patient states she is going to be completely out of medications on Monday and wants everything refilled at once to make it easy on her. She states she lives far from here and just talked to Dr. Kirtland Bouchard via video visit. Please advise. Thanks!

## 2022-12-15 NOTE — Telephone Encounter (Signed)
Medication Refill -  Most Recent Primary Care Visit:  Provider: Smitty Cords  Department: [facility]  Visit Type: MYCHART VIDEO VISIT  Date: 10/26/2022  Medication: metoprolol succinate (TOPROL-XL) 25 MG 24 hr tablet [[id number]    lisinopril (ZESTRIL) 40 MG tablet [[id number]    spironolactone (ALDACTONE) 25 MG tablet [[id number]  Has the patient contacted their pharmacy? Yes (Is this the correct pharmacy for this prescription? Yes If no, delete pharmacy and type the correct one.  This is the patient's preferred pharmacy  Is the patient out of the medication? Yes  Has the patient been seen for an appointment in the last year OR does the patient have an upcoming appointment? Yes  Can we respond through MyChart? Yes  Agent: Please be advised that Rx refills may take up to 3 business days. We ask that you follow-up with your pharmacy.

## 2022-12-15 NOTE — Telephone Encounter (Signed)
Requested Prescriptions  Pending Prescriptions Disp Refills   lisinopril (ZESTRIL) 40 MG tablet 90 tablet 1    Sig: Take 1 tablet (40 mg total) by mouth daily.     Cardiovascular:  ACE Inhibitors Failed - 12/14/2022  2:13 PM      Failed - Cr in normal range and within 180 days    Creat  Date Value Ref Range Status  03/05/2019 0.63 0.50 - 1.05 mg/dL Final    Comment:    For patients >61 years of age, the reference limit for Creatinine is approximately 13% higher for people identified as African-American. .    Creatinine, Ser  Date Value Ref Range Status  02/07/2022 0.79 0.44 - 1.00 mg/dL Final         Failed - K in normal range and within 180 days    Potassium  Date Value Ref Range Status  02/07/2022 3.7 3.5 - 5.1 mmol/L Final  07/30/2013 3.7 3.5 - 5.1 mmol/L Final         Passed - Patient is not pregnant      Passed - Last BP in normal range    BP Readings from Last 1 Encounters:  03/18/22 128/84         Passed - Valid encounter within last 6 months    Recent Outpatient Visits           1 month ago Acute non-recurrent frontal sinusitis   [place] [facility] Smitty Cords, [facility]   9 months ago Cough due to bronchospasm   [facility] C[facility], DO   11 months ago Acute non-recurrent frontal sinusitis   [place] [place], Netta Neat, DO   1 year ago Acute non-recurrent frontal sinusitis   [place] [place], Netta Neat, DO   1 year ago Acute non-recurrent frontal sinusitis   [facility] [place], Netta Neat, DO               metoprolol succinate (TOPROL-XL) 25 MG 24 hr tablet 90 tablet 1    Sig: Take 1 tablet (25 mg total) by mouth daily.     Cardiovascular:  Beta Blockers Passed - 12/14/2022  2:13 PM      Passed - Last BP in normal range    BP Readings from Last 1 Encounters:  03/18/22  128/84         Passed - Last Heart Rate in normal range    Pulse Readings from Last 1 Encounters:  03/18/22 92         Passed - Valid encounter within last 6 months    Recent Outpatient Visits           1 month ago Acute non-recurrent frontal sinusitis   [facility] [place], Netta Neat, DO   9 months ago Cough due to bronchospasm   [facility] Smitty Cords, DO   11 months ago Acute non-recurrent frontal sinusitis   [place] [facility] Smit[facility] C[facility], DO   1 year ago Acute non-recurrent frontal sinusitis   [place] [facility] Smitty Cords, DO   1 year ago Acute non-recurrent frontal sinusitis   [place] [facility] [place], Netta Neat, DO               spironolactone (ALDACTONE) 25 MG tablet  90 tablet 1    Sig: Take 1 tablet (25 mg total) by mouth daily.     Cardiovascular: Diuretics - Aldosterone Antagonist Failed - 12/14/2022  2:13 PM      Failed - Cr in normal range and within 180 days    Creat  Date Value Ref Range Status  03/05/2019 0.63 0.50 - 1.05 mg/dL Final    Comment:    For patients >11 years of age, the reference limit for Creatinine is approximately 13% higher for people identified as African-American. .    Creatinine, Ser  Date Value Ref Range Status  02/07/2022 0.79 0.44 - 1.00 mg/dL Final         Failed - K in normal range and within 180 days    Potassium  Date Value Ref Range Status  02/07/2022 3.7 3.5 - 5.1 mmol/L Final  07/30/2013 3.7 3.5 - 5.1 mmol/L Final         Failed - Na in normal range and within 180 days    Sodium  Date Value Ref Range Status  02/07/2022 141 135 - 145 mmol/L Final  02/11/2020 137 134 - 144 mmol/L Final  07/30/2013 140 136 - 145 mmol/L Final         Failed - eGFR is 30 or above and within 180 days    GFR, Est African American  Date Value Ref Range Status   03/05/2019 115 > OR = 60 mL/min/1.41m2 Final   GFR calc Af Amer  Date Value Ref Range Status  02/11/2020 103 >59 mL/min/1.73 Final    Comment:    **In accordance with recommendations from the NKF-ASN Task force,**   [facility] is in the process of updating its eGFR calculation to the   2021 CKD-EPI creatinine equation that estimates kidney function   without a race variable.    GFR, Est Non African American  Date Value Ref Range Status  03/05/2019 99 > OR = 60 mL/min/1.23m2 Final   GFR, Estimated  Date Value Ref Range Status  02/07/2022 >60 >60 mL/min Final    Comment:    (NOTE) Calculated using the CKD-EPI Creatinine Equation (2021)          Passed - Last BP in normal range    BP Readings from Last 1 Encounters:  03/18/22 128/84         Passed - Valid encounter within last 6 months    Recent Outpatient Visits           1 month ago Acute non-recurrent frontal sinusitis   [facility], DO   9 months ago Cough due to bronchospasm   [facility] C[facility], DO   11 months ago Acute non-recurrent frontal sinusitis   [place] [facility] Smitty Cords, DO   1 year ago Acute non-recurrent frontal sinusitis   [place] [facility] Smitty Cords, DO   1 year ago Acute non-recurrent frontal sinusitis   [place] [facility] [place], Netta Neat, [place]

## 2022-12-15 NOTE — Telephone Encounter (Signed)
Is she completely out of her medication? I denied the refill because she has not been seen to address her HTN since 2022.

## 2022-12-15 NOTE — Telephone Encounter (Signed)
[facility] [address] ([address]), [place] - [address]  [address]) [place] [address]  Phone: [phone] Fax: [phone]  Hours: Not open 24 hours

## 2022-12-15 NOTE — Telephone Encounter (Signed)
All medications were requested on 12/14/22 by the pharmacy in a separate refill encounter, still pending refill.

## 2022-12-15 NOTE — Telephone Encounter (Signed)
  Chief Complaint: Medication Symptoms: NA Frequency: NA Pertinent Negatives: Patient denies NA Disposition: [] ED /[] Urgent Care (no appt availability in office) / [] Appointment(In office/virtual)/ []  [place] [facility]/ [] Home Care/ [] Refused Recommended Disposition /[] [place] Mobile Bus/ [x]  Follow-up with PCP Additional Notes:   Pt calling, expresses frustration. States she had discussed with Dr. Althea Charon she would like to have her meds, Lisinopril, Spirolactone,and metoprolol refilled at same time "So I only have to make one trip, I live far away from the place."  States requested refill of all meds and only metoprolol filled. Other meds, Spirolactone and lisinopril were denied, labs due. Pt states Dr. Althea Charon knows "I don't have insurance and I can't have labs drawn."   Assured pt NT would route to practice for review. Pt verbalizes understanding. Please advise.

## 2022-12-16 NOTE — Telephone Encounter (Signed)
Requested medication (s) are due for refill today:  Requested medication (s) are on the active medication list:   Last refill:   Future visit scheduled   Notes to clinic:  Practice has responded, under PCPs review, please see encounters from 12/15/22  Requested Prescriptions  Pending Prescriptions Disp Refills   lisinopril (ZESTRIL) 40 MG tablet [Pharmacy Med Name: Lisinopril 40 MG Oral Tablet] 90 tablet 0    Sig: Take 1 tablet by mouth once daily     Cardiovascular:  ACE Inhibitors Failed - 12/14/2022  9:13 AM      Failed - Cr in normal range and within 180 days    Creat  Date Value Ref Range Status  03/05/2019 0.63 0.50 - 1.05 mg/dL Final    Comment:    For patients >56 years of age, the reference limit for Creatinine is approximately 13% higher for people identified as African-American. .    Creatinine, Ser  Date Value Ref Range Status  02/07/2022 0.79 0.44 - 1.00 mg/dL Final         Failed - K in normal range and within 180 days    Potassium  Date Value Ref Range Status  02/07/2022 3.7 3.5 - 5.1 mmol/L Final  07/30/2013 3.7 3.5 - 5.1 mmol/L Final         Passed - Patient is not pregnant      Passed - Last BP in normal range    BP Readings from Last 1 Encounters:  03/18/22 128/84         Passed - Valid encounter within last 6 months    Recent Outpatient Visits           1 month ago Acute non-recurrent frontal sinusitis   [place] [facility] Smit[facility] C[facility], DO   9 months ago Cough due to bronchospasm   [facility] S[facility] Cords, DO   11 months ago Acute non-recurrent frontal sinusitis   [place] [facility] [place], Netta Neat, DO   1 year ago Acute non-recurrent frontal sinusitis   [place] [facility] Smit[facility] Cords, DO   1 year ago Acute non-recurrent frontal sinusitis   [place] [facility] [place], Netta Neat, DO               metoprolol succinate (TOPROL-XL) 25 MG 24 hr tablet [Pharmacy Med Name: Metoprolol Succinate ER 25 MG Oral Tablet Extended Release 24 Hour] 90 tablet 0    Sig: Take 1 tablet by mouth once daily     Cardiovascular:  Beta Blockers Passed - 12/14/2022  9:13 AM      Passed - Last BP in normal range    BP Readings from Last 1 Encounters:  03/18/22 128/84         Passed - Last Heart Rate in normal range    Pulse Readings from Last 1 Encounters:  03/18/22 92         Passed - Valid encounter within last 6 months    Recent Outpatient Visits           1 month ago Acute non-recurrent frontal sinusitis   [place] [facility] [place], Netta Neat, DO   9 months ago Cough due to bronchospasm   [facility] Smit[facility] Cords, DO   11 months ago Acute non-recurrent frontal sinusitis   [place] [facility] [place], Lyn [place]  J, DO   1 year ago Acute non-recurrent frontal sinusitis   [facility] [place], Netta Neat, DO   1 year ago Acute non-recurrent frontal sinusitis   [place] [facility] [place], Netta Neat, DO               spironolactone (ALDACTONE) 25 MG tablet [Pharmacy Med Name: Spironolactone 25 MG Oral Tablet] 90 tablet 0    Sig: Take 1 tablet by mouth once daily     Cardiovascular: Diuretics - Aldosterone Antagonist Failed - 12/14/2022  9:13 AM      Failed - Cr in normal range and within 180 days    Creat  Date Value Ref Range Status  03/05/2019 0.63 0.50 - 1.05 mg/dL Final    Comment:    For patients >41 years of age, the reference limit for Creatinine is approximately 13% higher for people identified as African-American. .    Creatinine, Ser  Date Value Ref Range Status  02/07/2022 0.79 0.44 - 1.00 mg/dL Final         Failed - K in normal range and within 180 days    Potassium  Date Value Ref Range  Status  02/07/2022 3.7 3.5 - 5.1 mmol/L Final  07/30/2013 3.7 3.5 - 5.1 mmol/L Final         Failed - Na in normal range and within 180 days    Sodium  Date Value Ref Range Status  02/07/2022 141 135 - 145 mmol/L Final  02/11/2020 137 134 - 144 mmol/L Final  07/30/2013 140 136 - 145 mmol/L Final         Failed - eGFR is 30 or above and within 180 days    GFR, Est African American  Date Value Ref Range Status  03/05/2019 115 > OR = 60 mL/min/1.67m2 Final   GFR calc Af Amer  Date Value Ref Range Status  02/11/2020 103 >59 mL/min/1.73 Final    Comment:    **In accordance with recommendations from the NKF-ASN Task force,**   [facility] is in the process of updating its eGFR calculation to the   2021 CKD-EPI creatinine equation that estimates kidney function   without a race variable.    GFR, Est Non African American  Date Value Ref Range Status  03/05/2019 99 > OR = 60 mL/min/1.77m2 Final   GFR, Estimated  Date Value Ref Range Status  02/07/2022 >60 >60 mL/min Final    Comment:    (NOTE) Calculated using the CKD-EPI Creatinine Equation (2021)          Passed - Last BP in normal range    BP Readings from Last 1 Encounters:  03/18/22 128/84         Passed - Valid encounter within last 6 months    Recent Outpatient Visits           1 month ago Acute non-recurrent frontal sinusitis   [place] [facility] Smitty Cords, DO   9 months ago Cough due to bronchospasm   [facility] Smit[facility] Cords, DO   11 months ago Acute non-recurrent frontal sinusitis   [place] [facility] Smit[facility] Cords, DO   1 year ago Acute non-recurrent frontal sinusitis   [place] [facility] Smitty Cords, DO   1 year ago Acute non-recurrent frontal sinusitis   [place] [facility] [place], Netta Neat, [place]

## 2022-12-16 NOTE — Telephone Encounter (Signed)
Will give 30 day refill

## 2023-01-06 ENCOUNTER — Ambulatory Visit: Payer: Self-pay

## 2023-01-06 NOTE — Telephone Encounter (Signed)
    Chief Complaint: Rash arms, legs and trunk. Small red bumps. Itchy. Symptoms: Above Frequency: 1 month Pertinent Negatives: Patient denies fever Disposition: [] ED /[] Urgent Care (no appt availability in office) / [x] Appointment(In office/virtual)/ []  [place] [facility]/ [] Home Care/ [] Refused Recommended Disposition /[] [place] Mobile Bus/ []  Follow-up with PCP Additional Notes: Agrees with appointment.  Reason for Disposition  Mild widespread rash  (Exception: Heat rash lasting 3 days or less.)  Answer Assessment - Initial Assessment Questions 1. APPEARANCE of RASH: "Describe the rash." (e.g., spots, blisters, raised areas, skin peeling, scaly)     Red bumps 2. SIZE: "How big are the spots?" (e.g., tip of pen, eraser, coin; inches, centimeters)     Small 3. LOCATION: "Where is the rash located?"     All over 4. COLOR: "What color is the rash?" (Note: It is difficult to assess rash color in people with darker-colored skin. When this situation occurs, simply ask the caller to describe what they see.)     Red 5. ONSET: "When did the rash begin?"     1 month ago 6. FEVER: "Do you have a fever?" If Yes, ask: "What is your temperature, how was it measured, and when did it start?"     No 7. ITCHING: "Does the rash itch?" If Yes, ask: "How bad is the itch?" (Scale 1-10; or mild, moderate, severe)     Severe 8. CAUSE: "What do you think is causing the rash?"     Unsure 9. MEDICINE FACTORS: "Have you started any new medicines within the last 2 weeks?" (e.g., antibiotics)      No 10. OTHER SYMPTOMS: "Do you have any other symptoms?" (e.g., dizziness, headache, sore throat, joint pain)       No 11. PREGNANCY: "Is there any chance you are pregnant?" "When was your last menstrual period?"       No  Protocols used: Rash or Redness - [facility]

## 2023-01-10 ENCOUNTER — Ambulatory Visit: Payer: Self-pay | Admitting: Family Medicine

## 2023-01-16 ENCOUNTER — Other Ambulatory Visit: Payer: Self-pay | Admitting: Internal Medicine

## 2023-01-16 ENCOUNTER — Telehealth: Payer: Self-pay | Admitting: Family Medicine

## 2023-01-16 ENCOUNTER — Other Ambulatory Visit: Payer: Self-pay

## 2023-01-16 DIAGNOSIS — I10 Essential (primary) hypertension: Secondary | ICD-10-CM

## 2023-01-16 MED ORDER — SPIRONOLACTONE 25 MG PO TABS: 25.0000 mg | ORAL_TABLET | Freq: Every day | ORAL | 0 refills | Status: DC

## 2023-01-16 NOTE — Telephone Encounter (Signed)
 Pt called in , checking on refills, she says she will be out of her bp med, in days for Spironolacton. I let her know this is being worked on

## 2023-01-16 NOTE — Telephone Encounter (Signed)
 Pt called regarding refills. She states that Dr. Althea Charon said he would refill her medications.  Pt needs Metroprolol Lisinopril and Atorvastain refilled asap.  Pt is nearly out of medication. Pt is upset that meds have not been refilled yet.

## 2023-01-17 MED ORDER — LISINOPRIL 40 MG PO TABS: 40.0000 mg | ORAL_TABLET | Freq: Every day | ORAL | 1 refills | Status: DC

## 2023-01-17 MED ORDER — METOPROLOL SUCCINATE ER 25 MG PO TB24: 25.0000 mg | ORAL_TABLET | Freq: Every day | ORAL | 1 refills | Status: DC

## 2023-01-18 NOTE — Telephone Encounter (Signed)
 Duplicate request- all prescriptions have been filled by provider- 01/17/23 Requested Prescriptions  Pending Prescriptions Disp Refills   lisinopril  (ZESTRIL ) 40 MG tablet [Pharmacy Med Name: Lisinopril  40 MG Oral Tablet] 30 tablet 0    Sig: Take 1 tablet by mouth once daily     Cardiovascular:  ACE Inhibitors Failed - 01/18/2023 12:28 PM      Failed - Cr in normal range and within 180 days    Creat  Date Value Ref Range Status  03/05/2019 0.63 0.50 - 1.05 mg/dL Final    Comment:    For patients >39 years of age, the reference limit for Creatinine is approximately 13% higher for people identified as African-American. .    Creatinine, Ser  Date Value Ref Range Status  02/07/2022 0.79 0.44 - 1.00 mg/dL Final         Failed - K in normal range and within 180 days    Potassium  Date Value Ref Range Status  02/07/2022 3.7 3.5 - 5.1 mmol/L Final  07/30/2013 3.7 3.5 - 5.1 mmol/L Final         Passed - Patient is not pregnant      Passed - Last BP in normal range    BP Readings from Last 1 Encounters:  03/18/22 128/84         Passed - Valid encounter within last 6 months    Recent Outpatient Visits           2 months ago Acute non-recurrent frontal sinusitis   [place] [facility] [place] Marsa PARAS, DO   10 months ago Cough due to bronchospasm   [facility] [place] Mars[place] PARAS, DO   1 year ago Acute non-recurrent frontal sinusitis   [place] [facility] [place], Marsa PARAS, DO   1 year ago Acute non-recurrent frontal sinusitis   [place] [facility] [place] [facility]a PARAS, DO   1 year ago Acute non-recurrent frontal sinusitis   [place] [facility] [place], Marsa PARAS, DO               metoprolol  succinate (TOPROL -XL) 25 MG 24 hr tablet [Pharmacy Med Name: Metoprolol  Succinate ER 25 MG Oral Tablet Extended Release 24 Hour] 30 tablet 0    Sig:  Take 1 tablet by mouth once daily     Cardiovascular:  Beta Blockers Passed - 01/18/2023 12:28 PM      Passed - Last BP in normal range    BP Readings from Last 1 Encounters:  03/18/22 128/84         Passed - Last Heart Rate in normal range    Pulse Readings from Last 1 Encounters:  03/18/22 92         Passed - Valid encounter within last 6 months    Recent Outpatient Visits           2 months ago Acute non-recurrent frontal sinusitis   [place] [facility] [place], Marsa PARAS, DO   10 months ago Cough due to bronchospasm   [place] [facility] [place] [facility], DO   1 year ago Acute non-recurrent frontal sinusitis   [place] [facility] [place] [facility] PARAS, DO   1 year ago Acute non-recurrent frontal sinusitis   [place] [facility] [place], Marsa PARAS, DO   1 year ago Acute non-recurrent frontal sinusitis   [facility] [place] [facility], Marsa PARAS, DO               spironolactone  (ALDACTONE ) 25 MG tablet [Pharmacy Med Name: Spironolactone  25 MG Oral Tablet] 30 tablet 0    Sig: Take 1 tablet by mouth once daily     Cardiovascular: Diuretics - Aldosterone Antagonist Failed - 01/18/2023 12:28 PM      Failed - Cr in normal range and within 180 days    Creat  Date Value Ref Range Status  03/05/2019 0.63 0.50 - 1.05 mg/dL Final    Comment:    For patients >58 years of age, the reference limit for Creatinine is approximately 13% higher for people identified as African-American. .    Creatinine, Ser  Date Value Ref Range Status  02/07/2022 0.79 0.44 - 1.00 mg/dL Final         Failed - K in normal range and within 180 days    Potassium  Date Value Ref Range Status  02/07/2022 3.7 3.5 - 5.1 mmol/L Final  07/30/2013 3.7 3.5 - 5.1 mmol/L Final         Failed - Na in normal range and within 180 days    Sodium  Date Value Ref Range Status   02/07/2022 141 135 - 145 mmol/L Final  02/11/2020 137 134 - 144 mmol/L Final  07/30/2013 140 136 - 145 mmol/L Final         Failed - eGFR is 30 or above and within 180 days    GFR, Est African American  Date Value Ref Range Status  03/05/2019 115 > OR = 60 mL/min/1.62m2 Final   GFR calc Af Amer  Date Value Ref Range Status  02/11/2020 103 >59 mL/min/1.73 Final    Comment:    **In accordance with recommendations from the NKF-ASN Task force,**   [facility] is in the process of updating its eGFR calculation to the   2021 CKD-EPI creatinine equation that estimates kidney function   without a race variable.    GFR, Est Non African American  Date Value Ref Range Status  03/05/2019 99 > OR = 60 mL/min/1.24m2 Final   GFR, Estimated  Date Value Ref Range Status  02/07/2022 >60 >60 mL/min Final    Comment:    (NOTE) Calculated using the CKD-EPI Creatinine Equation (2021)          Passed - Last BP in normal range    BP Readings from Last 1 Encounters:  03/18/22 128/84         Passed - Valid encounter within last 6 months    Recent Outpatient Visits           2 months ago Acute non-recurrent frontal sinusitis   [place] [facility] [place], Marsa PARAS, DO   10 months ago Cough due to bronchospasm   [place] [facility] [place] Mars[facility] PARAS, DO   1 year ago Acute non-recurrent frontal sinusitis   [place] [facility] [place] Mars[facility] PARAS, DO   1 year ago Acute non-recurrent frontal sinusitis   [place] [facility] [place] Mars[place] PARAS, DO   1 year ago Acute non-recurrent frontal sinusitis   [place] [facility] [place], Marsa PARAS, [place]

## 2023-01-31 ENCOUNTER — Telehealth: Payer: Self-pay | Admitting: Family Medicine

## 2023-01-31 ENCOUNTER — Encounter: Payer: Self-pay | Admitting: Family Medicine

## 2023-01-31 DIAGNOSIS — J0110 Acute frontal sinusitis, unspecified: Secondary | ICD-10-CM

## 2023-01-31 DIAGNOSIS — M62830 Muscle spasm of back: Secondary | ICD-10-CM

## 2023-01-31 DIAGNOSIS — J9801 Acute bronchospasm: Secondary | ICD-10-CM

## 2023-01-31 DIAGNOSIS — B379 Candidiasis, unspecified: Secondary | ICD-10-CM

## 2023-01-31 MED ORDER — FLUCONAZOLE 150 MG PO TABS: ORAL_TABLET | ORAL | 1 refills | Status: DC

## 2023-01-31 MED ORDER — CYCLOBENZAPRINE HCL 10 MG PO TABS: 10.0000 mg | ORAL_TABLET | Freq: Three times a day (TID) | ORAL | 0 refills | Status: DC | PRN

## 2023-01-31 MED ORDER — PREDNISONE 20 MG PO TABS: ORAL_TABLET | ORAL | 0 refills | Status: DC

## 2023-01-31 MED ORDER — AMOXICILLIN-POT CLAVULANATE 875-125 MG PO TABS: 1.0000 | ORAL_TABLET | Freq: Two times a day (BID) | ORAL | 1 refills | Status: DC

## 2023-01-31 MED ORDER — GUAIFENESIN-CODEINE 100-10 MG/5ML PO SOLN: 5.0000 mL | Freq: Three times a day (TID) | ORAL | 0 refills | Status: DC | PRN

## 2023-01-31 MED ORDER — ALBUTEROL SULFATE HFA 108 (90 BASE) MCG/ACT IN AERS: 2.0000 | INHALATION_SPRAY | RESPIRATORY_TRACT | 2 refills | Status: DC | PRN

## 2023-01-31 NOTE — Patient Instructions (Addendum)

## 2023-01-31 NOTE — Progress Notes (Signed)
Subjective:    Patient ID: Shannon Small, female    DOB: 1960/03/15, 63 y.o.   MRN: [id number]  Shannon Small is a 63 y.o. female presenting on 01/31/2023 for Sinusitis   Virtual / Telehealth Encounter - Video Visit via MyChart The purpose of this virtual visit is to provide medical care while limiting exposure to the novel coronavirus (COVID19) for both patient and office staff.  Consent was obtained for remote visit:  Yes.   Answered questions that patient had about telehealth interaction:  Yes.   I discussed the limitations, risks, security and privacy concerns of performing an evaluation and management service by video/telephone. I also discussed with the patient that there may be a patient responsible charge related to this service. The patient expressed understanding and agreed to proceed.  Patient Location: Home Provider Location: [facility] (Office)  Participants in virtual visit: - Patient: Shannon Small - CMA: Fuller Plan CMA - Provider: Dr Althea Charon   HPI  Discussed the use of AI scribe software for clinical note transcription with the patient, who gave verbal consent to proceed.  History of Present Illness    Sinusitis   Symptoms started within the past 1+ week with sinusitis now into chest congestion. Pain with back and muscles with deep breathing at times. She has been self-medicating with over-the-counter cold and flu remedies but believes she needs stronger medication, including an inhaler and prednisone. She also requests a strong antibiotic, as she has a lot of greenish-brown mucus discharge from her nose.  The patient's last round of similar treatment was in October 2024 which she reports was effective. She also requests cough medicine with codeine, which was previously prescribed. She reports that her ears are hurting and she has been running a fever. She has been sleeping excessively due to feeling extremely unwell, which is unusual for  her.  The patient reports that her husband and son have also been sick. She works as a Midwife and believes that exposure to the weather may have contributed to her illness. She also reports muscle spasms and requests a muscle relaxant. She has previously taken Flexeril for this issue.          03/18/2022   11:11 AM 12/16/2019    2:25 PM 03/05/2019    9:24 AM  Depression screen PHQ 2/9  Decreased Interest 0 0 0  Down, Depressed, Hopeless 1 1 0  PHQ - 2 Score 1 1 0  Altered sleeping 0 1 0  Tired, decreased energy 2 3 0  Change in appetite 0 2 0  Feeling bad or failure about yourself  0 1 0  Trouble concentrating 0 0 0  Moving slowly or fidgety/restless 0 0 0  Suicidal thoughts 0 0 0  PHQ-9 Score 3 8 0  Difficult doing work/chores Not difficult at all Somewhat difficult Not difficult at all       03/18/2022   11:12 AM 12/16/2019    2:25 PM 03/05/2019    9:26 AM  GAD 7 : Generalized Anxiety Score  Nervous, Anxious, on Edge 0 1 0  Control/stop worrying 0 3 0  Worry too much - different things 1 2 0  Trouble relaxing 1 2 0  Restless 0 1 0  Easily annoyed or irritable 1 2 0  Afraid - awful might happen 0 0 0  Total GAD 7 Score 3 11 0  Anxiety Difficulty Somewhat difficult Somewhat difficult Not difficult at all  Social History   Tobacco Use   Smoking status: Never   Smokeless tobacco: Never  Vaping Use   Vaping status: Never Used  Substance Use Topics   Alcohol use: Yes    Alcohol/week: 0.0 standard drinks of alcohol    Comment: rarely   Drug use: No    Review of Systems Per HPI unless specifically indicated above     Objective:    There were no vitals taken for this visit.  Wt Readings from Last 3 Encounters:  03/18/22 (!) 324 lb (147 kg)  02/07/22 (!) 320 lb (145.2 kg)  02/05/22 (!) 324 lb 1.2 oz (147 kg)     Physical Exam  Note examination was completely remotely via video observation objective data only  Gen - well-appearing, no acute distress or  apparent pain, comfortable HEENT - eyes appear clear without discharge or redness Heart/Lungs - cannot examine virtually - observed evidence of coughing Abd - cannot examine virtually  Skin - face visible today- no rash Neuro - awake, alert, oriented Psych - not anxious appearing   Results for orders placed or performed during the hospital encounter of 02/07/22  Gastrointestinal Panel by PCR , Stool   Collection Time: 02/07/22  6:21 AM   Specimen: Stool  Result Value Ref Range   Campylobacter species NOT DETECTED NOT DETECTED   Plesimonas shigelloides NOT DETECTED NOT DETECTED   Salmonella species NOT DETECTED NOT DETECTED   Yersinia enterocolitica NOT DETECTED NOT DETECTED   Vibrio species NOT DETECTED NOT DETECTED   Vibrio cholerae NOT DETECTED NOT DETECTED   Enteroaggregative E coli (EAEC) NOT DETECTED NOT DETECTED   Enteropathogenic E coli (EPEC) NOT DETECTED NOT DETECTED   Enterotoxigenic E coli (ETEC) NOT DETECTED NOT DETECTED   Shiga like toxin producing E coli (STEC) NOT DETECTED NOT DETECTED   Shigella/Enteroinvasive E coli (EIEC) NOT DETECTED NOT DETECTED   Cryptosporidium NOT DETECTED NOT DETECTED   Cyclospora cayetanensis NOT DETECTED NOT DETECTED   Entamoeba histolytica NOT DETECTED NOT DETECTED   Giardia lamblia NOT DETECTED NOT DETECTED   Adenovirus F40/41 NOT DETECTED NOT DETECTED   Astrovirus NOT DETECTED NOT DETECTED   Norovirus GI/GII NOT DETECTED NOT DETECTED   Rotavirus A NOT DETECTED NOT DETECTED   Sapovirus (I, II, IV, and V) DETECTED (A) NOT DETECTED  C Difficile Quick Screen w PCR reflex   Collection Time: 02/07/22  6:21 AM   Specimen: Stool  Result Value Ref Range   C Diff antigen NEGATIVE NEGATIVE   C Diff toxin NEGATIVE NEGATIVE   C Diff interpretation No C. difficile detected.   CBC with Differential   Collection Time: 02/07/22  6:21 AM  Result Value Ref Range   WBC 10.4 4.0 - 10.5 K/uL   RBC 4.44 3.87 - 5.11 MIL/uL   Hemoglobin 12.6 12.0 -  15.0 g/dL   HCT 40.9 81.1 - 91.4 %   MCV 88.1 80.0 - 100.0 fL   MCH 28.4 26.0 - 34.0 pg   MCHC 32.2 30.0 - 36.0 g/dL   RDW 78.2 95.6 - 21.3 %   Platelets 246 150 - 400 K/uL   nRBC 0.0 0.0 - 0.2 %   Neutrophils Relative % 74 %   Neutro Abs 7.7 1.7 - 7.7 K/uL   Lymphocytes Relative 19 %   Lymphs Abs 1.9 0.7 - 4.0 K/uL   Monocytes Relative 5 %   Monocytes Absolute 0.6 0.1 - 1.0 K/uL   Eosinophils Relative 1 %   Eosinophils Absolute 0.1  0.0 - 0.5 K/uL   Basophils Relative 0 %   Basophils Absolute 0.0 0.0 - 0.1 K/uL   Immature Granulocytes 1 %   Abs Immature Granulocytes 0.06 0.00 - 0.07 K/uL  Comprehensive metabolic panel   Collection Time: 02/07/22  6:21 AM  Result Value Ref Range   Sodium 141 135 - 145 mmol/L   Potassium 3.7 3.5 - 5.1 mmol/L   Chloride 109 98 - 111 mmol/L   CO2 22 22 - 32 mmol/L   Glucose, Bld 106 (H) 70 - 99 mg/dL   BUN 13 6 - 20 mg/dL   Creatinine, Ser 1.61 0.44 - 1.00 mg/dL   Calcium 8.9 8.9 - 09.6 mg/dL   Total Protein 7.2 6.5 - 8.1 g/dL   Albumin 3.6 3.5 - 5.0 g/dL   AST 24 15 - 41 U/L   ALT 20 0 - 44 U/L   Alkaline Phosphatase 66 38 - 126 U/L   Total Bilirubin 0.6 0.3 - 1.2 mg/dL   GFR, Estimated >04 >54 mL/min   Anion gap 10 5 - 15  Lipase, blood   Collection Time: 02/07/22  6:21 AM  Result Value Ref Range   Lipase 25 11 - 51 U/L  Resp panel by RT-PCR (RSV, Flu A&B, Covid) Anterior Nasal Swab   Collection Time: 02/07/22  8:03 AM   Specimen: Anterior Nasal Swab  Result Value Ref Range   SARS Coronavirus 2 by RT PCR NEGATIVE NEGATIVE   Influenza A by PCR NEGATIVE NEGATIVE   Influenza B by PCR NEGATIVE NEGATIVE   Resp Syncytial Virus by PCR NEGATIVE NEGATIVE      Assessment & Plan:   Problem List Items Addressed This Visit     Cough due to bronchospasm   Relevant Medications   albuterol (VENTOLIN HFA) 108 (90 Base) MCG/ACT inhaler   guaiFENesin-codeine (VIRTUSSIN A/C) 100-10 MG/5ML syrup   predniSONE (DELTASONE) 20 MG tablet   Other  Visit Diagnoses       Acute non-recurrent frontal sinusitis    -  Primary   Relevant Medications   amoxicillin-clavulanate (AUGMENTIN) 875-125 MG tablet   fluconazole (DIFLUCAN) 150 MG tablet   guaiFENesin-codeine (VIRTUSSIN A/C) 100-10 MG/5ML syrup   predniSONE (DELTASONE) 20 MG tablet     Antibiotic-induced yeast infection       Relevant Medications   fluconazole (DIFLUCAN) 150 MG tablet     Back spasm       Relevant Medications   cyclobenzaprine (FLEXERIL) 10 MG tablet        Sinusitis Upper Respiratory Infection Severe chest and back pain, cough, congestion, and sinus symptoms. Greenish-brown nasal discharge. Previous successful treatment with Augmentin, prednisone, and inhaler in October.  -Order Augmentin 875mg  -Order Prednisone taper. -Order rescue inhaler Albuterol -Order Diflucan with a refill. -Order cough syrup with codeine.  Muscle Spasms Severe back pain, possibly related to muscle spasms. -Order Flexeril as needed for muscle spasms.  Follow-up as needed. If symptoms do not improve or worsen, patient should seek further medical attention.         No orders of the defined types were placed in this encounter.   Meds ordered this encounter  Medications   albuterol (VENTOLIN HFA) 108 (90 Base) MCG/ACT inhaler    Sig: Inhale 2 puffs into the lungs every 4 (four) hours as needed for wheezing or shortness of breath (cough).    Dispense:  6.7 g    Refill:  2   amoxicillin-clavulanate (AUGMENTIN) 875-125 MG tablet    Sig:  Take 1 tablet by mouth 2 (two) times daily.    Dispense:  20 tablet    Refill:  1   fluconazole (DIFLUCAN) 150 MG tablet    Sig: Take one tablet by mouth on Day 1. Repeat dose 2nd tablet on Day 3.    Dispense:  2 tablet    Refill:  1   guaiFENesin-codeine (VIRTUSSIN A/C) 100-10 MG/5ML syrup    Sig: Take 5 mLs by mouth 3 (three) times daily as needed for cough.    Dispense:  120 mL    Refill:  0   predniSONE (DELTASONE) 20 MG tablet     Sig: Take daily with food. Start with 60mg  (3 pills) x 2 days, then reduce to 40mg  (2 pills) x 2 days, then 20mg  (1 pill) x 3 days    Dispense:  13 tablet    Refill:  0   cyclobenzaprine (FLEXERIL) 10 MG tablet    Sig: Take 1 tablet (10 mg total) by mouth 3 (three) times daily as needed for muscle spasms.    Dispense:  30 tablet    Refill:  0    Follow up plan: Return if symptoms worsen or fail to improve.   Patient verbalizes understanding with the above medical recommendations including the limitation of remote medical advice.  Specific follow-up and call-back criteria were given for patient to follow-up or seek medical care more urgently if needed.  Total duration of direct patient care provided via video conference: 6 minutes   Saralyn Pilar, DO [facility] 01/31/2023, 4:37 PM

## 2023-02-06 ENCOUNTER — Ambulatory Visit: Payer: Self-pay

## 2023-02-06 DIAGNOSIS — J9801 Acute bronchospasm: Secondary | ICD-10-CM

## 2023-02-06 DIAGNOSIS — J0110 Acute frontal sinusitis, unspecified: Secondary | ICD-10-CM

## 2023-02-06 MED ORDER — BENZONATATE 100 MG PO CAPS: 100.0000 mg | ORAL_CAPSULE | Freq: Three times a day (TID) | ORAL | 0 refills | Status: DC | PRN

## 2023-02-06 NOTE — Telephone Encounter (Signed)
Message from [place] P sent at 02/06/2023  9:27 AM EST  Summary: cough   Pt called saying she seen provider last week for a cough.  She is taking medication and is still very congested .  [phone]         Chief Complaint: requesting cough medicine refill and tessalon and refill of inhaler. Coughing up "a lot of clear mucus." Stated overall she feels better. Just needs refills.  Symptoms: runny nose, coughing at night. Coughing causing chest soreness Frequency: seen in office 01/31/23 Pertinent Negatives: Patient denies fever, wheezing Disposition: [] ED /[] Urgent Care (no appt availability in office) / [] Appointment(In office/virtual)/ []  [facility]/ [] Home Care/ [] Refused Recommended Disposition /[] [place] [facility]/ [x]  Follow-up with PCP Additional Notes: advised pt to start saline nasal spray and follow with Flonase per package instructions.   Reason for Disposition  SEVERE coughing spells (e.g., whooping sound after coughing, vomiting after coughing)  Answer Assessment - Initial Assessment Questions 1. ONSET: "When did the cough begin?"      Seen in office 01/31/23 2. SEVERITY: "How bad is the cough today?"      nagging 3. SPUTUM: "Describe the color of your sputum" (none, dry cough; clear, white, yellow, green)     clear 4. HEMOPTYSIS: "Are you coughing up any blood?" If so ask: "How much?" (flecks, streaks, tablespoons, etc.)     no 5. DIFFICULTY BREATHING: "Are you having difficulty breathing?" If Yes, ask: "How bad is it?" (e.g., mild, moderate, severe)    - MILD: No SOB at rest, mild SOB with walking, speaks normally in sentences, can lie down, no retractions, pulse < 100.    - MODERATE: SOB at rest, SOB with minimal exertion and prefers to sit, cannot lie down flat, speaks in phrases, mild retractions, audible wheezing, pulse 100-120.    - SEVERE: Very SOB at rest, speaks in single words, struggling to breathe, sitting hunched forward, retractions, pulse  > 120      Mild- keeping up at ninth 6. FEVER: "Do you have a fever?" If Yes, ask: "What is your temperature, how was it measured, and when did it start?"     no 7. OTHER SYMPTOMS: "Do you have any other symptoms?" (e.g., runny nose, wheezing, chest pain)       Post nasal drip, chest soreness  Protocols used: Cough - Acute Productive-A-AH

## 2023-02-06 NOTE — Telephone Encounter (Signed)
Yes I can. Thank you for following up.  Rx sent  Start Tessalon Perls take 1 capsule up to 3 times a day as needed for cough  Saralyn Pilar, DO [facility] 02/06/2023, 1:43 PM

## 2023-02-06 NOTE — Addendum Note (Signed)
Addended by: Smitty Cords on: 02/06/2023 01:43 PM   Modules accepted: Orders

## 2023-02-06 NOTE — Telephone Encounter (Signed)
I reviewed her chart. I gave her every option I had for her respiratory illness. I am out of additional suggestions for congestion. I would suggest to give it more time and continue treatment.  She can try OTC decongestant therapy if interested sudafed type medications if interested short term.  Or if she has question about other medicine, can ask [place] and we can advise.  Saralyn Pilar, DO [facility] 02/06/2023, 12:22 PM

## 2023-02-08 ENCOUNTER — Encounter: Payer: Self-pay | Admitting: Internal Medicine

## 2023-02-08 ENCOUNTER — Ambulatory Visit: Payer: Self-pay | Admitting: Internal Medicine

## 2023-02-08 VITALS — BP 98/64 | HR 103 | Temp 98.0°F | Ht 65.0 in | Wt 327.6 lb

## 2023-02-08 DIAGNOSIS — J069 Acute upper respiratory infection, unspecified: Secondary | ICD-10-CM

## 2023-02-08 LAB — POC COVID19/FLU A&B COMBO
Covid Antigen, POC: NEGATIVE
Influenza A Antigen, POC: NEGATIVE
Influenza B Antigen, POC: NEGATIVE

## 2023-02-08 MED ORDER — DOXYCYCLINE HYCLATE 100 MG PO TABS: 100.0000 mg | ORAL_TABLET | Freq: Two times a day (BID) | ORAL | 0 refills | Status: DC

## 2023-02-08 MED ORDER — CETIRIZINE HCL 10 MG PO TABS: 10.0000 mg | ORAL_TABLET | Freq: Every day | ORAL | 0 refills | Status: DC

## 2023-02-08 MED ORDER — FLUTICASONE PROPIONATE 50 MCG/ACT NA SUSP: 2.0000 | Freq: Every day | NASAL | 0 refills | Status: DC

## 2023-02-08 NOTE — Progress Notes (Signed)
Subjective:    Patient ID: Shannon Small, female    DOB: 05-07-1960, 63 y.o.   MRN: [id number]  HPI  Discussed the use of AI scribe software for clinical note transcription with the patient, who gave verbal consent to proceed.   The patient presents with persistent cough and sinus congestion.  She has been experiencing sinus pressure and headaches for nearly two weeks, starting last Monday. Initially, she had a runny nose, which has resolved, but nasal congestion persists. She also has a sore throat without significant difficulty swallowing.  She describes a persistent cough with expectoration of clear to dark white sputum. Shortness of breath is present, along with episodes of nausea and vomiting, attributed to congestion and phlegm. She reports fever, chills, and body aches, with the fever reaching up to 101F last week.  She was previously prescribed prednisone, augmentin, an albuterol inhaler, and cough syrup with codeine, but there has been no significant improvement in symptoms. She feels lightheaded and experiences episodes of near syncope, particularly after exertion, such as shopping. She expresses concern about the lack of improvement despite medication.  No history of COPD or asthma. She does not smoke, drink, or use drugs. She has been using a leftover nasal mist from a previous prescription and expresses a desire for further relief from her symptoms.    Review of Systems   Past Medical History:  Diagnosis Date   Acid reflux 09/09/2014   Adjustment disorder with depressed mood 11/14/2006   ASSESSMENT: Bereavement. Recommend counseling with pastor or psychologist.    Benign neoplasm of skin 11/14/2006   Cephalalgia 02/10/2006   Chronic HFrEF (heart failure with reduced ejection fraction) (HCC)    a. 01/2020 Echo: EF 30-35%.   Chronic infection of sinus 09/09/2014   Extreme obesity 02/10/2006   Hypertension    LBP (low back pain) 01/15/2007   Chronic back strain and  spasms due to obesity.    Mixed hyperlipidemia    NICM (nonischemic cardiomyopathy) (HCC)    a. 11/2019 MV: EF 29%; b. 01/2020 Echo: EF 30-35%, glob HK, mild LVH, GrI DD, nl RV fxn, mildly dil LA, mod MR; b. 01/2020 Cath nonobs dzs.   Nonobstructive CAD (coronary artery disease)    a. 11/2019 MV: small apical defect - likely breast atten. EF 29%; b. 02/2020 Cath: LM nl, LADnl, LCX nl, OM1/2/3 nl, RCA 20p, RPDA/RPAV nl. EF 25-35%.   Sleep related leg cramps 09/09/2014    Current Outpatient Medications  Medication Sig Dispense Refill   acetaminophen (TYLENOL) 500 MG tablet Take 1,000 mg by mouth every 6 (six) hours as needed for moderate pain or headache.     albuterol (VENTOLIN HFA) 108 (90 Base) MCG/ACT inhaler Inhale 2 puffs into the lungs every 4 (four) hours as needed for wheezing or shortness of breath (cough). 6.7 g 2   amoxicillin-clavulanate (AUGMENTIN) 875-125 MG tablet Take 1 tablet by mouth 2 (two) times daily. 20 tablet 1   aspirin EC 81 MG tablet Take 1 tablet (81 mg total) by mouth daily. Swallow whole. 90 tablet 3   benzonatate (TESSALON) 100 MG capsule Take 1 capsule (100 mg total) by mouth 3 (three) times daily as needed for cough. 30 capsule 0   clotrimazole-betamethasone (LOTRISONE) cream APPLY TOPICALLY TO GENITAL AREA FOR VAGINAL ITCHING AND IRRITATION FOR UP TO 2 WEEKS, THEN STOP 45 g 3   cyclobenzaprine (FLEXERIL) 10 MG tablet Take 1 tablet (10 mg total) by mouth 3 (three) times daily as needed for  muscle spasms. 30 tablet 0   diclofenac Sodium (VOLTAREN) 1 % GEL Apply 2 g topically 4 (four) times daily as needed (knee joint pain). 100 g 5   ezetimibe (ZETIA) 10 MG tablet Take 1 tablet (10 mg total) by mouth daily. 30 tablet 6   fluconazole (DIFLUCAN) 150 MG tablet Take one tablet by mouth on Day 1. Repeat dose 2nd tablet on Day 3. 2 tablet 1   fluticasone (FLONASE) 50 MCG/ACT nasal spray Place 2 sprays into both nostrils daily. Use for 4-6 weeks then stop and use seasonally  or as needed. 16 g 3   furosemide (LASIX) 40 MG tablet Take 1 tablet (40 mg total) by mouth daily. (Patient not taking: Reported on 02/07/2022) 30 tablet 3   guaiFENesin-codeine (VIRTUSSIN A/C) 100-10 MG/5ML syrup Take 5 mLs by mouth 3 (three) times daily as needed for cough. 120 mL 0   hydrocortisone 2.5 % cream Apply topically 2 (two) times daily. Use on face or sensitive skin 28 g 2   hydrOXYzine (ATARAX) 25 MG tablet Take 1 tablet (25 mg total) by mouth 3 (three) times daily as needed for itching. 90 tablet 3   ibuprofen (ADVIL) 800 MG tablet TAKE 1 TABLET BY MOUTH EVERY 8 HOURS AS NEEDED FOR MODERATE PAIN 90 tablet 0   ipratropium (ATROVENT) 0.06 % nasal spray Place 2 sprays into both nostrils 4 (four) times daily as needed for rhinitis (congestion). 15 mL 0   ketoconazole (NIZORAL) 2 % cream Apply 1 Application topically daily as needed for irritation. 30 g 2   lisinopril (ZESTRIL) 40 MG tablet Take 1 tablet (40 mg total) by mouth daily. 90 tablet 1   metoprolol succinate (TOPROL-XL) 25 MG 24 hr tablet Take 1 tablet (25 mg total) by mouth daily. 90 tablet 1   predniSONE (DELTASONE) 20 MG tablet Take daily with food. Start with 60mg  (3 pills) x 2 days, then reduce to 40mg  (2 pills) x 2 days, then 20mg  (1 pill) x 3 days 13 tablet 0   spironolactone (ALDACTONE) 25 MG tablet Take 1 tablet (25 mg total) by mouth daily. 30 tablet 0   triamcinolone cream (KENALOG) 0.5 % APPLY CREAM TOPICALLY TO AFFECTED AREA TWICE DAILY FOR UP TO 2 WEEKS 30 g 2   Current Facility-Administered Medications  Medication Dose Route Frequency Provider Last Rate Last Admin   sodium chloride flush (NS) 0.9 % injection 3 mL  3 mL Intravenous Q12H Debbe Odea, MD        Allergies  Allergen Reactions   Atorvastatin Other (See Comments)    Myalgias    Family History  Problem Relation Age of Onset   Stomach cancer Father    Kidney cancer Father    Diabetes Maternal Grandmother    Stroke Maternal Grandmother     Stroke Maternal Grandfather    Heart disease Paternal Grandmother    Stroke Paternal Grandfather    Diabetes Mother    Alcohol abuse Brother    Breast cancer Paternal Aunt    Lung cancer Paternal Aunt    Bladder Cancer Neg Hx    Prostate cancer Neg Hx     Social History   Socioeconomic History   Marital status: Married    Spouse name: Not on file   Number of children: Not on file   Years of education: Not on file   Highest education level: Not on file  Occupational History   Not on file  Tobacco Use   Smoking status: Never  Smokeless tobacco: Never  Vaping Use   Vaping status: Never Used  Substance and Sexual Activity   Alcohol use: Yes    Alcohol/week: 0.0 standard drinks of alcohol    Comment: rarely   Drug use: No   Sexual activity: Not on file  Other Topics Concern   Not on file  Social History Narrative   Not on file   Social Drivers of Health   Financial Resource Strain: Not on file  Food Insecurity: Not on file  Transportation Needs: Not on file  Physical Activity: Not on file  Stress: Not on file  Social Connections: Not on file  Intimate Partner Violence: Not on file     Constitutional: Pt reports headache, fever and chills. Denies malaise, fatigue, or abrupt weight changes.  HEENT: Pt reports sinus pressure, nasal congestion and sore throat. Denies eye pain, eye redness, ear pain, ringing in the ears, wax buildup, runny nose, nasal congestion, bloody nose. Respiratory: Pt reports cough and shortness of breath. Denies difficulty breathing, cough or sputum production.   Cardiovascular: Denies chest pain, chest tightness, palpitations or swelling in the hands or feet.  Gastrointestinal: Denies abdominal pain, bloating, constipation, diarrhea or blood in the stool.  Musculoskeletal: Pt reports body aches. Denies decrease in range of motion, difficulty with gait, or joint swelling.  Skin: Denies redness, rashes, lesions or ulcercations.  Neurological: Pt  reports lightheaded. Denies difficulty with memory, difficulty with speech or problems with balance and coordination.    No other specific complaints in a complete review of systems (except as listed in HPI above).      Objective:   Physical Exam  BP 98/64 (BP Location: Right Arm, Patient Position: Sitting, Cuff Size: Large)   Pulse (!) 103   Temp 98 F (36.7 C)   Ht 5\' 5"  (1.651 m)   Wt (!) 327 lb 9.6 oz (148.6 kg)   SpO2 98%   BMI 54.52 kg/m   Wt Readings from Last 3 Encounters:  03/18/22 (!) 324 lb (147 kg)  02/07/22 (!) 320 lb (145.2 kg)  02/05/22 (!) 324 lb 1.2 oz (147 kg)    General: Appears her stated age, appears unwell but in NAD. Skin: Warm, dry and intact.  HEENT: Head: normal shape and size, maxillary and frontal sinus tenderness noted; Eyes: sclera white, no icterus, conjunctiva pink, PERRLA and EOMs intact; Ears: Tm's gray and intact, normal light reflex; Nose: mucosa pink and moist, septum midline; Throat/Mouth: Teeth present, mucosa erythematous and moist, + PND, no exudate, lesions or ulcerations noted.  Neck:  No adenopathy noted. Cardiovascular: Tachycardic with normal rhythm.  Pulmonary/Chest: Normal effort and diminished breath sounds. No respiratory distress. No wheezes, rales or ronchi noted.  Neurological: Alert and oriented.   BMET    Component Value Date/Time   NA 141 02/07/2022 0621   NA 137 02/11/2020 1055   NA 140 07/30/2013 0847   K 3.7 02/07/2022 0621   K 3.7 07/30/2013 0847   CL 109 02/07/2022 0621   CL 107 07/30/2013 0847   CO2 22 02/07/2022 0621   CO2 27 07/30/2013 0847   GLUCOSE 106 (H) 02/07/2022 0621   GLUCOSE 93 07/30/2013 0847   BUN 13 02/07/2022 0621   BUN 19 02/11/2020 1055   BUN 17 07/30/2013 0847   CREATININE 0.79 02/07/2022 0621   CREATININE 0.63 03/05/2019 1007   CALCIUM 8.9 02/07/2022 0621   CALCIUM 8.5 07/30/2013 0847   GFRNONAA >60 02/07/2022 0621   GFRNONAA 99 03/05/2019 1007  GFRAA 103 02/11/2020 1055   GFRAA  115 03/05/2019 1007    Lipid Panel     Component Value Date/Time   CHOL 202 (H) 10/08/2021 1003   TRIG 68 10/08/2021 1003   HDL 69 10/08/2021 1003   CHOLHDL 2.9 10/08/2021 1003   VLDL 14 10/08/2021 1003   LDLCALC 119 (H) 10/08/2021 1003   LDLCALC 111 (H) 03/05/2019 1007    CBC    Component Value Date/Time   WBC 10.4 02/07/2022 0621   RBC 4.44 02/07/2022 0621   HGB 12.6 02/07/2022 0621   HGB 13.8 02/11/2020 1055   HCT 39.1 02/07/2022 0621   HCT 41.6 02/11/2020 1055   PLT 246 02/07/2022 0621   PLT 228 02/11/2020 1055   MCV 88.1 02/07/2022 0621   MCV 85 02/11/2020 1055   MCV 85 07/30/2013 0847   MCH 28.4 02/07/2022 0621   MCHC 32.2 02/07/2022 0621   RDW 12.9 02/07/2022 0621   RDW 12.7 02/11/2020 1055   RDW 13.4 07/30/2013 0847   LYMPHSABS 1.9 02/07/2022 0621   MONOABS 0.6 02/07/2022 0621   EOSABS 0.1 02/07/2022 0621   BASOSABS 0.0 02/07/2022 0621    Hgb A1C Lab Results  Component Value Date   HGBA1C 5.3 03/05/2019            Assessment & Plan:   Assessment and Plan    Upper Respiratory Infection Persistent symptoms of sinus congestion, cough, and lightheadedness despite treatment with prednisone, Augmentin, albuterol inhaler, and Tessalon pearls. No improvement noted. Lungs clear on auscultation, mucus clear, and negative for COVID and flu. Possible viral or allergy etiology. -Start Doxycycline 100mg  BID for 10 days. -Recommend over-the-counter Zyrtec 10mg  daily and Flonase nasal spray twice daily. -Continue Tessalon pearls as needed for cough.    Followup with your PCP as previously scheduled Nicki Reaper, NP

## 2023-02-08 NOTE — Patient Instructions (Signed)

## 2023-02-10 ENCOUNTER — Ambulatory Visit: Payer: Self-pay

## 2023-02-10 NOTE — Telephone Encounter (Signed)
Message from [place] L sent at 02/10/2023  9:45 AM EST  Summary: lost voice   Pt has been sick for two weeks and lost voice this past week. Pt seeking advice on what can she do at home to get voice back.  Please assist pt further         Chief Complaint: hoarse- can barely understand what she is saying- voice sounds strained Symptoms: runny nose and taking abx for resp infection Frequency: 4 days ago  Disposition: [] ED /[] Urgent Care (no appt availability in office) / [] Appointment(In office/virtual)/ []  [facility]/ [x] Home Care/ [] Refused Recommended Disposition /[] [facility]/ []  Follow-up with PCP Additional Notes:   Reason for Disposition  Hoarseness lasts < 2 weeks  Answer Assessment - Initial Assessment Questions 1. DESCRIPTION: "Describe your voice." (e.g., coarse, raspy, weaker, airy, scratchy, deeper)     Voice  2. SEVERITY: "How bad is it?"   - MILD: doesn't interfere with normal activities   - MODERATE: interferes with normal activities such as school or work   - SEVERE: only able to whisper.     moderate 3. ONSET: "When did the hoarseness begin?"     Tues 4. COUGH: "Is there a cough?" If Yes, ask: "How bad is it?"     cough 8. CAUSE: "What do you think is causing the hoarseness?"     Resp infection 9. OTHER SYMPTOMS: "Do you have any other symptoms?" (e.g., breathing difficulty, fever, foreign body, lymph node swelling in neck, rash, sore throat, weight loss)     no  Protocols used: Hoarseness-A-AH

## 2023-02-15 ENCOUNTER — Ambulatory Visit: Payer: Self-pay | Admitting: *Deleted

## 2023-02-15 NOTE — Telephone Encounter (Signed)
 I agree she does not need another visit. She has been evaluated multiple times already. Based on her history she has been on antibiotics without success, that suggests that at this point there is not a bacterial infection. We are experiencing extremely high rates of viral respiratory infections. It is likely viral laryngitis that is going to take time to resolve. It can take several weeks to fully resolve.  There are a lot of general health remedies available, but I do not have any rx medication for laryngitis and getting voice back.   Here are some general suggestions that can be given for laryngitis  Voice rest, Hydration, Humidified air, OTC decongestant, anti histamine, warm salt water gargle, lozenge, warm herbal tea with honey/lemon.  If hoarse voice persists for 4 weeks or more, we could consider referral to ENT. But they usually do not accept this type of referral if it is recent due to illness because it should gradually resolve.  I see Lisinopril  is mentioned, I am not sure the reference, if she asked or if someone else brought it up. Lisinopril  can cause some swelling including throat lip and face, it does not matter if it has been taken for weeks or years, it can still happen. But this should not cause isolated hoarse voice only without any other symptoms. It seems odd, but if still >4 weeks hoarse voice change of this med can be considered.  Marsa Officer, DO [facility] 02/15/2023, 5:36 PM

## 2023-02-15 NOTE — Telephone Encounter (Signed)
 Summary: Concerns with voice from sickness   Pt is calling in because she says she still hasn't gotten her voice back and it's been over a week. Pt says she has been sick for 3 weeks and has taken 2 different antibiotics. Pt is requesting advice on how she can get her voice back.         Reason for Disposition  [1] Hoarseness starting > 24 hours ago AND [2] taking an ACE Inhibitor medicine (e.g., benazepril / LOTENSIN, captopril / CAPOTEN, enalapril / VASOTEC, lisinopril  / ZESTRIL )  Answer Assessment - Initial Assessment Questions 1. DESCRIPTION: Describe your voice. (e.g., coarse, raspy, weaker, airy, scratchy, deeper)     Hoarseness, scratchy 2. SEVERITY: How bad is it?   - MILD: doesn't interfere with normal activities   - MODERATE: interferes with normal activities such as school or work   - SEVERE: only able to whisper.     moderate 3. ONSET: When did the hoarseness begin?     3 weeks 4. COUGH: Is there a cough? If Yes, ask: How bad is it?     Yes- clear- sometime in am brownish- tastes salty 5. FEVER: Do you have a fever? If Yes, ask: What is your temperature, how was it measured, and when did it start?     no 6. ALLERGIES: Any allergy symptoms? If Yes, ask: What are they?     Using Flonase - under control, although patient does report she feels sinus drainage 7. IRRITANTS: Do you smoke? Have you been exposed to any irritating fumes? (e.g., smoke)     no 8. CAUSE: What do you think is causing the hoarseness?    Recent URI 9. OTHER SYMPTOMS: Do you have any other symptoms? (e.g., breathing difficulty, fever, foreign body, lymph node swelling in neck, rash, sore throat, weight loss)     no  Protocols used: Hoarseness-A-AH

## 2023-02-15 NOTE — Telephone Encounter (Signed)
  Chief Complaint: hoarseness- 3 weeks Symptoms: recent URI with 2 rounds of antibiotic treatment. Patient still has cough with clear sputum Frequency: 3 weeks Pertinent Negatives: Patient denies breathing difficulty, fever, foreign body, lymph node swelling in neck, rash, sore throat, weight loss  Disposition: [] ED /[] Urgent Care (no appt availability in office) / [] Appointment(In office/virtual)/ []  [facility]/ [] Home Care/ [] Refused Recommended Disposition /[] [facility]/ [x]  Follow-up with PCP Additional Notes: Patient declines appointment offer- she wants to know how to get her voice back. Patient confirms she has been taking Lisinopril  for several years- without problem.

## 2023-02-16 NOTE — Telephone Encounter (Signed)
 Spoke with patient gave her all recommendations. She will start the home remedies today.

## 2023-03-02 ENCOUNTER — Other Ambulatory Visit: Payer: Self-pay | Admitting: Family Medicine

## 2023-03-02 DIAGNOSIS — I10 Essential (primary) hypertension: Secondary | ICD-10-CM

## 2023-03-03 ENCOUNTER — Telehealth: Payer: Self-pay

## 2023-03-03 ENCOUNTER — Other Ambulatory Visit: Payer: Self-pay

## 2023-03-03 ENCOUNTER — Telehealth: Payer: Self-pay | Admitting: Family Medicine

## 2023-03-03 NOTE — Telephone Encounter (Signed)
Prescription Request  03/03/2023  LOV: 03/18/2022  What is the name of the medication or equipment? Spironolactone   Have you contacted your pharmacy to request a refill? No   Which pharmacy would you like this sent to?  [facility] [address] ([address]), [place] - [address] [place]) [place] [address] Phone: [phone] Fax: [phone]    Patient notified that their request is being sent to the clinical staff for review and that they should receive a response within 2 business days.   Please advise at Mobile [phone] (mobile)

## 2023-03-03 NOTE — Telephone Encounter (Signed)
Copied from CRM [id number]. Topic: Clinical - Medication Question >> Mar 03, 2023  9:25 AM Shannon Small wrote: Reason for CRM: Patient is calling in about a prescription refill for spironolactone (ALDACTONE) 25 MG tablet. Patient wants to know if we can a two month supply

## 2023-03-20 ENCOUNTER — Ambulatory Visit: Payer: Self-pay | Admitting: Family Medicine

## 2023-03-20 NOTE — Telephone Encounter (Signed)
 The last lab resul that we have is from 01/2022 shows normal Hemoglobin range.  Unfortunately there is no Rx version of iron that is any better than the OTC version. If she is taking OTC Iron twice a day with meal, that is essentially the max dose.  Next option is referral to Hematologist. They can discuss IV iron or infusions.  Saralyn Pilar, DO [facility] 03/20/2023, 11:47 AM

## 2023-03-20 NOTE — Telephone Encounter (Signed)
Patient notified, verbal understanding.

## 2023-03-20 NOTE — Telephone Encounter (Signed)
  Chief Complaint: Medication question  Disposition: [] ED /[] Urgent Care (no appt availability in office) / [] Appointment(In office/virtual)/ []  [facility]/ [] Home Care/ [] Refused Recommended Disposition /[] [facility]/ [x]  Follow-up with PCP Additional Notes: Patient calls stating she went to give plasma and was denied because her iron is too low. Patient states she is taking OTC iron pills BID, eating foods high in iron, but still remains low. Reports fatigue at times. States she is unable to afford an appt and is requesting PCP call in something for iron. Requesting call back from clinic. Alerting PCP for review and follow up.   Reason for Disposition  [1] Caller has medicine question about med NOT prescribed by PCP AND [2] triager unable to answer question (e.g., compatibility with other med, storage)  Answer Assessment - Initial Assessment Questions 1. NAME of MEDICINE: "What medicine(s) are you calling about?"     Something for iron 2. QUESTION: "What is your question?" (e.g., double dose of medicine, side effect)     Can PCP prescribe medication for low iron 3. PRESCRIBER: "Who prescribed the medicine?" Reason: if prescribed by specialist, call should be referred to that group.     NA 4. SYMPTOMS: "Do you have any symptoms?" If Yes, ask: "What symptoms are you having?"  "How bad are the symptoms (e.g., mild, moderate, severe)     Fatigued  Protocols used: Medication Question Call-A-AH

## 2023-04-08 ENCOUNTER — Other Ambulatory Visit: Payer: Self-pay | Admitting: Family Medicine

## 2023-04-08 DIAGNOSIS — M62830 Muscle spasm of back: Secondary | ICD-10-CM

## 2023-04-08 DIAGNOSIS — S39012A Strain of muscle, fascia and tendon of lower back, initial encounter: Secondary | ICD-10-CM

## 2023-04-11 NOTE — Telephone Encounter (Signed)
 Requested medication (s) are due for refill today: yes  Requested medication (s) are on the active medication list: yes  Last refill:  11/12/22  Future visit scheduled: no  Notes to clinic:  Unable to refill per protocol due to failed labs, no updated results.      Requested Prescriptions  Pending Prescriptions Disp Refills   ibuprofen (ADVIL) 800 MG tablet [Pharmacy Med Name: Ibuprofen 800 MG Oral Tablet] 90 tablet 0    Sig: TAKE 1 TABLET BY MOUTH EVERY 8 HOURS AS NEEDED FOR MODERATE PAIN     Analgesics:  NSAIDS Failed - 04/11/2023 10:58 AM      Failed - Manual Review: Labs are only required if the patient has taken medication for more than 8 weeks.      Failed - Cr in normal range and within 360 days    Creat  Date Value Ref Range Status  03/05/2019 0.63 0.50 - 1.05 mg/dL Final    Comment:    For patients >78 years of age, the reference limit for Creatinine is approximately 13% higher for people identified as African-American. .    Creatinine, Ser  Date Value Ref Range Status  02/07/2022 0.79 0.44 - 1.00 mg/dL Final         Failed - HGB in normal range and within 360 days    Hemoglobin  Date Value Ref Range Status  02/07/2022 12.6 12.0 - 15.0 g/dL Final  40/98/1191 47.8 11.1 - 15.9 g/dL Final         Failed - PLT in normal range and within 360 days    Platelets  Date Value Ref Range Status  02/07/2022 246 150 - 400 K/uL Final  02/11/2020 228 150 - 450 x10E3/uL Final         Failed - HCT in normal range and within 360 days    HCT  Date Value Ref Range Status  02/07/2022 39.1 36.0 - 46.0 % Final   Hematocrit  Date Value Ref Range Status  02/11/2020 41.6 34.0 - 46.6 % Final         Failed - eGFR is 30 or above and within 360 days    GFR, Est African American  Date Value Ref Range Status  03/05/2019 115 > OR = 60 mL/min/1.35m2 Final   GFR calc Af Amer  Date Value Ref Range Status  02/11/2020 103 >59 mL/min/1.73 Final    Comment:    **In accordance  with recommendations from the NKF-ASN Task force,**   [facility] is in the process of updating its eGFR calculation to the   2021 CKD-EPI creatinine equation that estimates kidney function   without a race variable.    GFR, Est Non African American  Date Value Ref Range Status  03/05/2019 99 > OR = 60 mL/min/1.58m2 Final   GFR, Estimated  Date Value Ref Range Status  02/07/2022 >60 >60 mL/min Final    Comment:    (NOTE) Calculated using the CKD-EPI Creatinine Equation (2021)          Failed - Valid encounter within last 12 months    Recent Outpatient Visits   None            Passed - Patient is not pregnant

## 2023-05-03 ENCOUNTER — Encounter: Payer: Self-pay | Admitting: Family Medicine

## 2023-05-03 ENCOUNTER — Ambulatory Visit (INDEPENDENT_AMBULATORY_CARE_PROVIDER_SITE_OTHER): Payer: Self-pay | Admitting: Family Medicine

## 2023-05-03 VITALS — BP 130/80 | HR 82 | Resp 20 | Ht 65.0 in | Wt 336.0 lb

## 2023-05-03 DIAGNOSIS — L209 Atopic dermatitis, unspecified: Secondary | ICD-10-CM

## 2023-05-03 DIAGNOSIS — D509 Iron deficiency anemia, unspecified: Secondary | ICD-10-CM

## 2023-05-03 DIAGNOSIS — B372 Candidiasis of skin and nail: Secondary | ICD-10-CM

## 2023-05-03 MED ORDER — TRIAMCINOLONE ACETONIDE 0.5 % EX CREA: 1.0000 | TOPICAL_CREAM | Freq: Two times a day (BID) | CUTANEOUS | 1 refills | Status: DC

## 2023-05-03 MED ORDER — NYSTATIN 100000 UNIT/GM EX POWD: 1.0000 | Freq: Three times a day (TID) | CUTANEOUS | 3 refills | Status: DC

## 2023-05-03 MED ORDER — FLUCONAZOLE 150 MG PO TABS: 300.0000 mg | ORAL_TABLET | ORAL | 0 refills | Status: DC

## 2023-05-03 NOTE — Patient Instructions (Addendum)
 Thank you for coming to the office today.  Add Vitamin C to your iron supplement. See if this will work, take Ferrous sulfate 325mg  x 1 or 2 per dose, once a day - don't take both the tonic and the pills  Use the Powder + Pills for yeast infection under breast skin fold.  ---------  The rash looks most consistent with eczema, this can flare up and get worse due to a variety of factors (excessive dry skin from bathing/showering, soaps, cold weather / indoor heaters, outdoor exposures).  Use the topical steroid creams twice a day for up to 1 week, maximum duration of use per one flare is 10 to 14 days, then STOP using it and allow skin to recover. Caution with over-use may cause lightening of the skin.   Hydrocortisone  on face only and the Triamcinolone  / Kenalog  on body only.  Tips to reduce Eczema Flares: For baths/showers, limit bathing to every other day if you can (max 1 x daily)  Use a gentle, unscented soap and lukewarm water (hot water is most irritating to skin) Never scrub skin with too much pressure, this causes more irritation. Pat skin dry, then leave it slightly damp. DO NOT scrub it dry. Apply steroid cream to skin and rub in all the way, wait 15 min, then apply a daily moisturizer (Vaseline, Eucerin, Aveeno). Continue daily moisturizer every day of the year (even after flare is resolved) - If you have eczema on hands or dry hands, recommend wearing any type of gloves overnight (cloth, fabric, or even nitrile/latex) to improve effect of topical moisturizer  If develops redness, honey colored crust oozing, drainage of pus, bleeding, or redness / swelling, pain, please return for re-evaluation, may have become infected after scratching.   Please schedule a Follow-up Appointment to: Return if symptoms worsen or fail to improve.  If you have any other questions or concerns, please feel free to call the office or send a message through MyChart. You may also schedule an earlier  appointment if necessary.  Additionally, you may be receiving a survey about your experience at our office within a few days to 1 week by e-mail or mail. We value your feedback.  Domingo Friend, DO [facility], [place]

## 2023-05-03 NOTE — Progress Notes (Signed)
 Subjective:    Patient ID: Shannon Small, female    DOB: Jul 30, 1960, 63 y.o.   MRN: [id number]  Shannon Small is a 63 y.o. female presenting on 05/03/2023 for Rash (Under breast and stomach fold)   HPI  Discussed the use of AI scribe software for clinical note transcription with the patient, who gave verbal consent to proceed.  History of Present Illness   Shannon Small is a 63 year old female who presents with a worsening rash and issues with maintaining iron levels.  She has a worsening rash under her breasts and belly, similar to a previous yeast infection she had a few years ago. The rash is red, irritated, and itchy, and has been present for about two to three weeks. It is described as dry, rough, and scabby due to scratching. Various over-the-counter treatments, including baby Destin and foot powder, have been tried without relief. She recalls being previously treated with a large tube of cream and a pill for a similar condition, which was effective. Currently, she has no anti-yeast or eczema creams at home and mentions that the rash is also present in skin folds, suggesting a possible yeast infection. She needs both powder and pills to manage the condition.  She is experiencing difficulty maintaining her iron levels and has had low HCT, which is affecting her ability to donate plasma. Despite taking over-the-counter iron pills (65 mg, two in the morning and two at night) and SSS tonic, her iron levels remain low. Her hematocrit levels fluctuate, recently dropping to 35-37, whereas they were previously at 40-41 after consuming iron-rich foods. She is not currently taking vitamin C with her iron supplements.          05/03/2023    9:04 AM 03/18/2022   11:11 AM 12/16/2019    2:25 PM  Depression screen PHQ 2/9  Decreased Interest 1 0 0  Down, Depressed, Hopeless 0 1 1  PHQ - 2 Score 1 1 1   Altered sleeping 1 0 1  Tired, decreased energy 2 2 3   Change in appetite 0 0 2  Feeling bad or  failure about yourself  1 0 1  Trouble concentrating 0 0 0  Moving slowly or fidgety/restless 0 0 0  Suicidal thoughts 0 0 0  PHQ-9 Score 5 3 8   Difficult doing work/chores Not difficult at all Not difficult at all Somewhat difficult       05/03/2023    9:04 AM 03/18/2022   11:12 AM 12/16/2019    2:25 PM 03/05/2019    9:26 AM  GAD 7 : Generalized Anxiety Score  Nervous, Anxious, on Edge 1 0 1 0  Control/stop worrying 1 0 3 0  Worry too much - different things 1 1 2  0  Trouble relaxing 1 1 2  0  Restless 0 0 1 0  Easily annoyed or irritable 2 1 2  0  Afraid - awful might happen 0 0 0 0  Total GAD 7 Score 6 3 11  0  Anxiety Difficulty Somewhat difficult Somewhat difficult Somewhat difficult Not difficult at all    Social History   Tobacco Use   Smoking status: Never   Smokeless tobacco: Never  Vaping Use   Vaping status: Never Used  Substance Use Topics   Alcohol use: Yes    Alcohol/week: 0.0 standard drinks of alcohol    Comment: rarely   Drug use: No    Review of Systems Per HPI unless specifically indicated above  Objective:    BP 130/80 (BP Location: Right Arm, Patient Position: Sitting, Cuff Size: Large)   Pulse 82   Resp 20   Ht 5\' 5"  (1.651 m)   Wt (!) 336 lb (152.4 kg)   SpO2 99%   BMI 55.91 kg/m   Wt Readings from Last 3 Encounters:  05/03/23 (!) 336 lb (152.4 kg)  02/08/23 (!) 327 lb 9.6 oz (148.6 kg)  03/18/22 (!) 324 lb (147 kg)    Physical Exam Vitals and nursing note reviewed.  Constitutional:      General: She is not in acute distress.    Appearance: Normal appearance. She is well-developed. She is not diaphoretic.     Comments: Well-appearing, comfortable, cooperative  HENT:     Head: Normocephalic and atraumatic.  Eyes:     General:        Right eye: No discharge.        Left eye: No discharge.     Conjunctiva/sclera: Conjunctivae normal.  Cardiovascular:     Rate and Rhythm: Normal rate.  Pulmonary:     Effort: Pulmonary effort is  normal.  Skin:    General: Skin is warm and dry.     Findings: Rash (intertrigo under bilateral breasts and abdomen skin folds. no open ulceration. Scattered excoriated spots on chest wall and neck from scratching.) present. No erythema.  Neurological:     Mental Status: She is alert and oriented to person, place, and time.  Psychiatric:        Mood and Affect: Mood normal.        Behavior: Behavior normal.        Thought Content: Thought content normal.     Comments: Well groomed, good eye contact, normal speech and thoughts     Results for orders placed or performed in visit on 02/08/23  POC Covid19/Flu A&B Antigen   Collection Time: 02/08/23 11:29 AM  Result Value Ref Range   Influenza A Antigen, POC Negative Negative   Influenza B Antigen, POC Negative Negative   Covid Antigen, POC Negative Negative      Assessment & Plan:   Problem List Items Addressed This Visit   None Visit Diagnoses       Candidal intertrigo    -  Primary   Relevant Medications   nystatin  (MYCOSTATIN /NYSTOP ) powder   fluconazole  (DIFLUCAN ) 150 MG tablet     Atopic dermatitis, unspecified type       Relevant Medications   triamcinolone  cream (KENALOG ) 0.5 %     Iron deficiency anemia, unspecified iron deficiency anemia type           Candidal Intertrigo suspected Recurrent intertrigo exacerbated by heat and moisture, responsive to antifungal treatment. - Prescribe nystatin  powder, 60 grams, for affected areas. - Prescribe fluconazole  2 pills weekly for 4 weeks.  Atopic Dermatitis vs Eczema Chronic eczema flare-up, responsive to triamcinolone . In past had larger jar quantity. She is out now. - Prescribe triamcinolone  0.5% cream in a jar, apply twice daily as needed for up to two weeks. - Daily moisturizing   Iron deficiency anemia Chronic iron deficiency anemia with suboptimal response to oral supplementation. Discussed vitamin C to enhance absorption. She has had low HCT at times when  attempting plasma donation had to be paused - Recommend vitamin C supplements with iron pills. - Consider hematology referral for iron infusion if oral supplementation ineffective.        No orders of the defined types were placed in this encounter.  Meds ordered this encounter  Medications   triamcinolone  cream (KENALOG ) 0.5 %    Sig: Apply 1 Application topically 2 (two) times daily. To affected areas, for up to 2 weeks.    Dispense:  454 g    Refill:  1   nystatin  (MYCOSTATIN /NYSTOP ) powder    Sig: Apply 1 Application topically 3 (three) times daily.    Dispense:  60 g    Refill:  3   fluconazole  (DIFLUCAN ) 150 MG tablet    Sig: Take 2 tablets (300 mg total) by mouth once a week. For 4 weeks for intertrigo    Dispense:  8 tablet    Refill:  0    Follow up plan: Return if symptoms worsen or fail to improve.  Domingo Friend, DO [facility] 05/03/2023, 9:23 AM

## 2023-05-03 NOTE — Addendum Note (Signed)
 Addended by: Raina Bunting on: 05/03/2023 01:08 PM   Modules accepted: Level of Service

## 2023-05-18 ENCOUNTER — Ambulatory Visit: Payer: Self-pay | Admitting: *Deleted

## 2023-05-18 ENCOUNTER — Ambulatory Visit: Payer: Self-pay | Admitting: Family Medicine

## 2023-05-18 ENCOUNTER — Encounter: Payer: Self-pay | Admitting: Family Medicine

## 2023-05-18 VITALS — BP 140/76 | HR 91 | Ht 65.0 in | Wt 335.0 lb

## 2023-05-18 DIAGNOSIS — H9202 Otalgia, left ear: Secondary | ICD-10-CM

## 2023-05-18 DIAGNOSIS — J0110 Acute frontal sinusitis, unspecified: Secondary | ICD-10-CM

## 2023-05-18 DIAGNOSIS — H1032 Unspecified acute conjunctivitis, left eye: Secondary | ICD-10-CM

## 2023-05-18 DIAGNOSIS — H6992 Unspecified Eustachian tube disorder, left ear: Secondary | ICD-10-CM

## 2023-05-18 MED ORDER — POLYMYXIN B-TRIMETHOPRIM 10000-0.1 UNIT/ML-% OP SOLN: 1.0000 [drp] | Freq: Four times a day (QID) | OPHTHALMIC | 0 refills | Status: DC

## 2023-05-18 MED ORDER — AMOXICILLIN-POT CLAVULANATE 875-125 MG PO TABS: 1.0000 | ORAL_TABLET | Freq: Two times a day (BID) | ORAL | 0 refills | Status: DC

## 2023-05-18 NOTE — Patient Instructions (Addendum)

## 2023-05-18 NOTE — Progress Notes (Signed)
 Subjective:    Patient ID: Shannon Small, female    DOB: [birth year], 63 y.o.   MRN: [id number]  Shannon Small is a 63 y.o. female presenting on 05/18/2023 for No chief complaint on file.  Patient presents for a same day appointment.  HPI  Discussed the use of AI scribe software for clinical note transcription with the patient, who gave verbal consent to proceed.  History of Present Illness   Shannon Small is a 63 year old female who presents with left eye pain and swelling.  She has experienced pain and swelling in her left eye for the past three days. The swelling is localized to the inner part of the eye and is painful to touch, especially when closing the eye. No drainage or crusting is present. She has not used any new products, makeups, or cleansers that could have triggered the swelling. The swelling appeared after she started rubbing her eye due to the pain.  In addition to the eye symptoms, she has a sore throat, ear pain, and a headache. The headache is described as throbbing, and she suspects it may be related to sinus issues. She also has symptoms associates with sinus pressure. There is no redness or signs of infection in the ear.  She has Flonase  but currently not using it. She is interested to restart          05/18/2023    9:58 AM 05/03/2023    9:04 AM 03/18/2022   11:11 AM  Depression screen PHQ 2/9  Decreased Interest 0 1 0  Down, Depressed, Hopeless 1 0 1  PHQ - 2 Score 1 1 1   Altered sleeping 1 1 0  Tired, decreased energy 1 2 2   Change in appetite 0 0 0  Feeling bad or failure about yourself  0 1 0  Trouble concentrating 0 0 0  Moving slowly or fidgety/restless 0 0 0  Suicidal thoughts 0 0 0  PHQ-9 Score 3 5 3   Difficult doing work/chores Not difficult at all Not difficult at all Not difficult at all       05/18/2023    9:59 AM 05/03/2023    9:04 AM 03/18/2022   11:12 AM 12/16/2019    2:25 PM  GAD 7 : Generalized Anxiety Score  Nervous, Anxious, on Edge 0 1  0 1  Control/stop worrying 1 1 0 3  Worry too much - different things 1 1 1 2   Trouble relaxing 1 1 1 2   Restless 0 0 0 1  Easily annoyed or irritable 1 2 1 2   Afraid - awful might happen 0 0 0 0  Total GAD 7 Score 4 6 3 11   Anxiety Difficulty Not difficult at all Somewhat difficult Somewhat difficult Somewhat difficult    Social History   Tobacco Use   Smoking status: Never   Smokeless tobacco: Never  Vaping Use   Vaping status: Never Used  Substance Use Topics   Alcohol use: Yes    Alcohol/week: 0.0 standard drinks of alcohol    Comment: rarely   Drug use: No    Review of Systems Per HPI unless specifically indicated above     Objective:     BP (!) 140/76 (BP Location: Right Wrist, Patient Position: Sitting, Cuff Size: Normal)   Pulse 91   Ht 5\' 5"  (1.651 m)   Wt (!) 335 lb (152 kg)   SpO2 100%   BMI 55.75 kg/m   Wt Readings from Last  3 Encounters:  05/18/23 (!) 335 lb (152 kg)  05/03/23 (!) 336 lb (152.4 kg)  02/08/23 (!) 327 lb 9.6 oz (148.6 kg)    Physical Exam Vitals and nursing note reviewed.  Constitutional:      General: She is not in acute distress.    Appearance: Normal appearance. She is well-developed. She is obese. She is not diaphoretic.     Comments: Well-appearing, comfortable, cooperative  HENT:     Head: Normocephalic and atraumatic.  Eyes:     General:        Right eye: No discharge.        Left eye: No discharge.     Extraocular Movements: Extraocular movements intact.     Conjunctiva/sclera: Conjunctivae normal.     Pupils: Pupils are equal, round, and reactive to light.     Comments: Left eyelid with inner aspect localized mild swelling, non indurated, no erythema. No drainage. No stye or hordeolum identified on exam. No drainage or discharge  Cardiovascular:     Rate and Rhythm: Normal rate.  Pulmonary:     Effort: Pulmonary effort is normal.  Skin:    General: Skin is warm and dry.     Findings: No erythema or rash.   Neurological:     Mental Status: She is alert and oriented to person, place, and time.  Psychiatric:        Mood and Affect: Mood normal.        Behavior: Behavior normal.        Thought Content: Thought content normal.     Comments: Well groomed, good eye contact, normal speech and thoughts         Assessment & Plan:   Problem List Items Addressed This Visit   None Visit Diagnoses       Acute conjunctivitis of left eye, unspecified acute conjunctivitis type    -  Primary   Relevant Medications   trimethoprim-polymyxin b (POLYTRIM) ophthalmic solution     Acute non-recurrent frontal sinusitis       Relevant Medications   amoxicillin -clavulanate (AUGMENTIN ) 875-125 MG tablet     Left ear pain         Dysfunction of left eustachian tube            Left eyelid swelling and pain Swelling and pain in the inner corner of left eye, seems to be area of tear ducts and convergence of upper and lower eyelids. likely due to allergic reaction or physical irritation.  - her eye is clear. No signs of bacterial infection or conjunctivitis. No pre-septal cellulitis  Prescribed antibiotic eye drops for prophylaxis - Advised cool compresses for swelling, warm compresses for pain.  - Consider return to [facility] if symptoms persist. - Provided work note for absence today  Sinusitis Eustachian Tube Dysfunction Clear fluid behind left eardrum likely due to sinus pressure, causing discomfort. - Recommended over-the-counter nasal spray (e.g., Fluticasone ). - Prescribed amoxicillin  if symptoms worsen, to start in 24-48 hours if needed.        No orders of the defined types were placed in this encounter.   Meds ordered this encounter  Medications   trimethoprim-polymyxin b (POLYTRIM) ophthalmic solution    Sig: Place 1 drop into the left eye 4 (four) times daily.    Dispense:  10 mL    Refill:  0   amoxicillin -clavulanate (AUGMENTIN ) 875-125 MG tablet    Sig: Take 1  tablet by mouth 2 (two) times daily.  Dispense:  20 tablet    Refill:  0    Follow up plan: Return if symptoms worsen or fail to improve.   Domingo Friend, DO [facility] 05/18/2023, 10:27 AM

## 2023-05-18 NOTE — Telephone Encounter (Signed)
 Copied from CRM [id number]. Topic: Clinical - Red Word Triage >> May 18, 2023  8:05 AM Rosamond Comes wrote: Red Word that prompted transfer to Nurse Triage: patient calling in left eye hurts, to close it and touch it. Reason for Disposition  MODERATE eye pain or discomfort (e.g., interferes with normal activities or awakens from sleep; more than mild)  Answer Assessment - Initial Assessment Questions 1. ONSET: "When did the pain start?" (e.g., minutes, hours, days)     I'm having pain in my left eye in the corner next to my nose.   It hurts to close it and touch it.   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   I need to have it checked. It started 3 days ago and getting worse.  2. TIMING: "Does the pain come and go, or has it been constant since it started?" (e.g., constant, intermittent, fleeting)     Constant 3. SEVERITY: "How bad is the pain?"   (Scale 1-10; mild, moderate or severe)   - MILD (1-3): doesn't interfere with normal activities    - MODERATE (4-7): interferes with normal activities or awakens from sleep    - SEVERE (8-10): excruciating pain and patient unable to do normal activities     Moderate pain 4. LOCATION: "Where does it hurt?"  (e.g., eyelid, eye, cheekbone)     Corner is swollen next to corner 5. CAUSE: "What do you think is causing the pain?"     I don't know 6. VISION: "Do you have blurred vision or changes in your vision?"      I can't hardly close it 7. EYE DISCHARGE: "Is there any discharge (pus) from the eye(s)?"  If Yes, ask: "What color is it?"      No 8. FEVER: "Do you have a fever?" If Yes, ask: "What is it, how was it measured, and when did it start?"      No 9. OTHER SYMPTOMS: "Do you have any other symptoms?" (e.g., headache, nasal discharge, facial rash)     No 10. PREGNANCY: "Is there any chance you are pregnant?" "When was your last menstrual period?"       N/A due to age  Protocols used: Eye Pain and Other Symptoms-A-AH  Chief Complaint: Left eye is  painful Symptoms: Hard to close eye and it's very painful to touch Frequency: For last 3 days and getting worse Pertinent Negatives: Patient denies drainage, redness Disposition: [] ED /[] Urgent Care (no appt availability in office) / [x] Appointment(In office/virtual)/ []  [place] [facility]/ [] Home Care/ [] Refused Recommended Disposition /[] [place] [facility]/ []  Follow-up with PCP Additional Notes: Appt made for this morning with Dr. Romeo Co

## 2023-06-16 ENCOUNTER — Ambulatory Visit: Payer: Self-pay

## 2023-06-16 NOTE — Telephone Encounter (Signed)
 He gave her nystatin , oral diflucan  and triamcinolone  cream. If no improvement in symptoms, recommend she make a follow up appt with Dr. Linnell Richardson.

## 2023-06-16 NOTE — Telephone Encounter (Signed)
 FYI Only or Action Required?: Action required by provider  Patient was last seen in primary care on 05/18/2023 by Raina Bunting, DO. Called Nurse Triage reporting Rash. Symptoms began several weeks ago. Rash, red skin under breasts, at bend of knees. Interventions attempted: Prescription medications: Nystatin . Symptoms are: gradually worsening. Pt. States "I think I need something for eczema. The Nystatin  helped at first, but not now."  Triage Disposition: Call PCP Within 24 Hours  Patient/caregiver understands and will follow disposition?: Yes            Copied from CRM [id number]. Topic: Clinical - Red Word Triage >> Jun 16, 2023  1:00 PM Oddis Bench wrote: Red Word that prompted transfer to Nurse Triage: Patient Is calling about yeast infection under her breast she states that Dr prescribed powder that has not worked it is getting worse, sore, itching, red inflamed Reason for Disposition  [1] Applying cream or ointment AND [2] causes severe itch, burning or pain  Answer Assessment - Initial Assessment Questions 1. APPEARANCE of RASH: "Describe the rash."      Red skin 2. LOCATION: "Where is the rash located?"      Under breast 3. NUMBER: "How many spots are there?"      Red skin 4. SIZE: "How big are the spots?" (Inches, centimeters or compare to size of a coin)      large 5. ONSET: "When did the rash start?"      For awhile 6. ITCHING: "Does the rash itch?" If Yes, ask: "How bad is the itch?"  (Scale 0-10; or none, mild, moderate, severe)     yes 7. PAIN: "Does the rash hurt?" If Yes, ask: "How bad is the pain?"  (Scale 0-10; or none, mild, moderate, severe)    - NONE (0): no pain    - MILD (1-3): doesn't interfere with normal activities     - MODERATE (4-7): interferes with normal activities or awakens from sleep     - SEVERE (8-10): excruciating pain, unable to do any normal activities     yes 8. OTHER SYMPTOMS: "Do you have any other symptoms?" (e.g., fever)      no 9. PREGNANCY: "Is there any chance you are pregnant?" "When was your last menstrual period?"     no  Protocols used: Rash or Redness - Localized-A-AH

## 2023-06-19 ENCOUNTER — Other Ambulatory Visit: Payer: Self-pay | Admitting: Family Medicine

## 2023-06-19 DIAGNOSIS — B372 Candidiasis of skin and nail: Secondary | ICD-10-CM

## 2023-06-19 MED ORDER — CLOTRIMAZOLE-BETAMETHASONE 1-0.05 % EX CREA: TOPICAL_CREAM | CUTANEOUS | 1 refills | Status: DC

## 2023-06-19 NOTE — Telephone Encounter (Signed)
 Please call her let her know new rx mixed cream with Betamethasone  + Clotrimazole  sent to pharmacy. It is both steroid + anti fungal in one. She should try this next twice a day for 1-2 weeks then may repeat course if needs again.  Domingo Friend, DO [facility] 06/19/2023, 12:38 PM

## 2023-06-19 NOTE — Telephone Encounter (Signed)
 Patient notified new rx sent in, gave instructions on use. Verbal understanding

## 2023-07-15 ENCOUNTER — Other Ambulatory Visit: Payer: Self-pay | Admitting: Family Medicine

## 2023-07-15 DIAGNOSIS — I10 Essential (primary) hypertension: Secondary | ICD-10-CM

## 2023-07-17 ENCOUNTER — Other Ambulatory Visit: Payer: Self-pay | Admitting: Family Medicine

## 2023-07-17 DIAGNOSIS — I10 Essential (primary) hypertension: Secondary | ICD-10-CM

## 2023-07-17 NOTE — Telephone Encounter (Unsigned)
 Copied from CRM [id number]. Topic: Clinical - Medication Refill >> Jul 17, 2023  9:16 AM Silvana PARAS wrote: Medication: lisinopril  (ZESTRIL ) 40 MG tablet, metoprolol  succinate (TOPROL -XL) 25 MG 24 hr tablet , and spironolactone  (ALDACTONE ) 25 MG tablet  Has the patient contacted their pharmacy? Yes (Agent: If no, request that the patient contact the pharmacy for the refill. If patient does not wish to contact the pharmacy document the reason why and proceed with request.) (Agent: If yes, when and what did the pharmacy advise?)  This is the patient's preferred pharmacy:  [facility] [address]), [place] - [address] [place]) [place] [address] Phone: [phone] Fax: [phone]  Is this the correct pharmacy for this prescription? Yes If no, delete pharmacy and type the correct one.   Has the prescription been filled recently? Yes  Is the patient out of the medication? No  Has the patient been seen for an appointment in the last year OR does the patient have an upcoming appointment? Yes  Can we respond through MyChart? Yes  Agent: Please be advised that Rx refills may take up to 3 business days. We ask that you follow-up with your pharmacy.

## 2023-07-18 ENCOUNTER — Other Ambulatory Visit: Payer: Self-pay | Admitting: Family Medicine

## 2023-07-18 DIAGNOSIS — I10 Essential (primary) hypertension: Secondary | ICD-10-CM

## 2023-07-18 MED ORDER — METOPROLOL SUCCINATE ER 25 MG PO TB24: 25.0000 mg | ORAL_TABLET | Freq: Every day | ORAL | 1 refills | Status: DC

## 2023-07-18 NOTE — Telephone Encounter (Signed)
 Requested medication (s) are due for refill today: na  Requested medication (s) are on the active medication list: yes  Last refill:  metoprolol , lisinopril - 01/17/23 #90 1 refills, aldactone - 03/03/23 #60 2 refills  Future visit scheduled: no   Notes to clinic:  patient requesting refills. Protocol failed last labs 02/07/22. Called patient to schedule appt for medication refills/ annual exam. Patient reports she can not schedule appt at this time due to family dynamics. Patient requesting refills for 6 months. Do you want to refill Rxs?     Requested Prescriptions  Pending Prescriptions Disp Refills   lisinopril  (ZESTRIL ) 40 MG tablet [Pharmacy Med Name: Lisinopril  40 MG Oral Tablet] 90 tablet 0    Sig: Take 1 tablet by mouth once daily     Cardiovascular:  ACE Inhibitors Failed - 07/18/2023  1:50 PM      Failed - Cr in normal range and within 180 days    Creat  Date Value Ref Range Status  03/05/2019 0.63 0.50 - 1.05 mg/dL Final    Comment:    For patients >59 years of age, the reference limit for Creatinine is approximately 13% higher for people identified as African-American. .    Creatinine, Ser  Date Value Ref Range Status  02/07/2022 0.79 0.44 - 1.00 mg/dL Final         Failed - K in normal range and within 180 days    Potassium  Date Value Ref Range Status  02/07/2022 3.7 3.5 - 5.1 mmol/L Final  07/30/2013 3.7 3.5 - 5.1 mmol/L Final         Failed - Last BP in normal range    BP Readings from Last 1 Encounters:  05/18/23 (!) 140/76         Failed - Valid encounter within last 6 months    Recent Outpatient Visits           2 months ago Acute conjunctivitis of left eye, unspecified acute conjunctivitis type   [place] [facility] Marsa PARAS, DO   2 months ago Candidal intertrigo   [place] [facility] [place], Marsa PARAS, [place]              Passed - Patient is not pregnant       spironolactone   (ALDACTONE ) 25 MG tablet [Pharmacy Med Name: Spironolactone  25 MG Oral Tablet] 90 tablet 0    Sig: Take 1 tablet by mouth once daily     Cardiovascular: Diuretics - Aldosterone Antagonist Failed - 07/18/2023  1:50 PM      Failed - Cr in normal range and within 180 days    Creat  Date Value Ref Range Status  03/05/2019 0.63 0.50 - 1.05 mg/dL Final    Comment:    For patients >32 years of age, the reference limit for Creatinine is approximately 13% higher for people identified as African-American. .    Creatinine, Ser  Date Value Ref Range Status  02/07/2022 0.79 0.44 - 1.00 mg/dL Final         Failed - K in normal range and within 180 days    Potassium  Date Value Ref Range Status  02/07/2022 3.7 3.5 - 5.1 mmol/L Final  07/30/2013 3.7 3.5 - 5.1 mmol/L Final         Failed - Na in normal range and within 180 days    Sodium  Date Value Ref Range Status  02/07/2022 141 135 - 145 mmol/L Final  02/11/2020 137 134 - 144 mmol/L Final  07/30/2013 140 136 - 145 mmol/L Final         Failed - eGFR is 30 or above and within 180 days    GFR, Est African American  Date Value Ref Range Status  03/05/2019 115 > OR = 60 mL/min/1.2m2 Final   GFR calc Af Amer  Date Value Ref Range Status  02/11/2020 103 >59 mL/min/1.73 Final    Comment:    **In accordance with recommendations from the NKF-ASN Task force,**   [facility] is in the process of updating its eGFR calculation to the   2021 CKD-EPI creatinine equation that estimates kidney function   without a race variable.    GFR, Est Non African American  Date Value Ref Range Status  03/05/2019 99 > OR = 60 mL/min/1.16m2 Final   GFR, Estimated  Date Value Ref Range Status  02/07/2022 >60 >60 mL/min Final    Comment:    (NOTE) Calculated using the CKD-EPI Creatinine Equation (2021)          Failed - Last BP in normal range    BP Readings from Last 1 Encounters:  05/18/23 (!) 140/76         Failed - Valid encounter within last 6  months    Recent Outpatient Visits           2 months ago Acute conjunctivitis of left eye, unspecified acute conjunctivitis type   [facility] [place] [place], Marsa PARAS, DO   2 months ago Candidal intertrigo   [place] [facility] [place], Marsa PARAS, DO               metoprolol  succinate (TOPROL -XL) 25 MG 24 hr tablet 90 tablet 1    Sig: Take 1 tablet (25 mg total) by mouth daily.     Cardiovascular:  Beta Blockers Failed - 07/18/2023  1:50 PM      Failed - Last BP in normal range    BP Readings from Last 1 Encounters:  05/18/23 (!) 140/76         Failed - Valid encounter within last 6 months    Recent Outpatient Visits           2 months ago Acute conjunctivitis of left eye, unspecified acute conjunctivitis type   [facility] Edman Marsa PARAS, DO   2 months ago Candidal intertrigo   [place] [facility] Edman Marsa PARAS, [place]              Passed - Last Heart Rate in normal range    Pulse Readings from Last 1 Encounters:  05/18/23 91

## 2023-07-18 NOTE — Telephone Encounter (Signed)
 Called patient to schedule appt for medication refills. Last OV 2 months ago for conjuctivitis. Last labs 02/07/22. Patient reports she does not have time to come in for another OV due to her mother is dying and her child is missing. No appt scheduled at this time.

## 2023-07-19 NOTE — Telephone Encounter (Signed)
 Duplicate, refilled on 07/18/23.  Requested Prescriptions  Pending Prescriptions Disp Refills   lisinopril  (ZESTRIL ) 40 MG tablet 90 tablet 1    Sig: Take 1 tablet (40 mg total) by mouth daily.     Cardiovascular:  ACE Inhibitors Failed - 07/19/2023  8:36 AM      Failed - Cr in normal range and within 180 days    Creat  Date Value Ref Range Status  03/05/2019 0.63 0.50 - 1.05 mg/dL Final    Comment:    For patients >62 years of age, the reference limit for Creatinine is approximately 13% higher for people identified as African-American. .    Creatinine, Ser  Date Value Ref Range Status  02/07/2022 0.79 0.44 - 1.00 mg/dL Final         Failed - K in normal range and within 180 days    Potassium  Date Value Ref Range Status  02/07/2022 3.7 3.5 - 5.1 mmol/L Final  07/30/2013 3.7 3.5 - 5.1 mmol/L Final         Failed - Last BP in normal range    BP Readings from Last 1 Encounters:  05/18/23 (!) 140/76         Failed - Valid encounter within last 6 months    Recent Outpatient Visits           2 months ago Acute conjunctivitis of left eye, unspecified acute conjunctivitis type   [facility] Ed[facility] Marsa PARAS, DO   2 months ago Candidal intertrigo   [place] [facility] [place], Marsa PARAS, [place] - Patient is not pregnant       metoprolol  succinate (TOPROL -XL) 25 MG 24 hr tablet 90 tablet 1    Sig: Take 1 tablet (25 mg total) by mouth daily.     Cardiovascular:  Beta Blockers Failed - 07/19/2023  8:36 AM      Failed - Last BP in normal range    BP Readings from Last 1 Encounters:  05/18/23 (!) 140/76         Failed - Valid encounter within last 6 months    Recent Outpatient Visits           2 months ago Acute conjunctivitis of left eye, unspecified acute conjunctivitis type    [facility] [place], Marsa PARAS, DO   2 months ago Candidal intertrigo   [facility] Edman Marsa PARAS, [place]              Passed - Last Heart Rate in normal range    Pulse Readings from Last 1 Encounters:  05/18/23 91          spironolactone  (ALDACTONE ) 25 MG tablet 60 tablet 2    Sig: Take 1 tablet (25 mg total) by mouth daily.     Cardiovascular: Diuretics - Aldosterone Antagonist Failed - 07/19/2023  8:36 AM      Failed - Cr in normal range and within 180 days    Creat  Date Value Ref Range Status  03/05/2019 0.63 0.50 - 1.05 mg/dL Final    Comment:    For patients >21 years of age, the reference limit for Creatinine is approximately 13% higher for people identified as African-American. .    Creatinine, Ser  Date Value Ref Range Status  02/07/2022 0.79 0.44 - 1.00 mg/dL Final  Failed - K in normal range and within 180 days    Potassium  Date Value Ref Range Status  02/07/2022 3.7 3.5 - 5.1 mmol/L Final  07/30/2013 3.7 3.5 - 5.1 mmol/L Final         Failed - Na in normal range and within 180 days    Sodium  Date Value Ref Range Status  02/07/2022 141 135 - 145 mmol/L Final  02/11/2020 137 134 - 144 mmol/L Final  07/30/2013 140 136 - 145 mmol/L Final         Failed - eGFR is 30 or above and within 180 days    GFR, Est African American  Date Value Ref Range Status  03/05/2019 115 > OR = 60 mL/min/1.69m2 Final   GFR calc Af Amer  Date Value Ref Range Status  02/11/2020 103 >59 mL/min/1.73 Final    Comment:    **In accordance with recommendations from the NKF-ASN Task force,**   [facility] is in the process of updating its eGFR calculation to the   2021 CKD-EPI creatinine equation that estimates kidney function   without a race variable.    GFR, Est Non African American  Date Value Ref Range Status  03/05/2019 99 > OR = 60 mL/min/1.75m2 Final   GFR, Estimated  Date Value Ref Range Status  02/07/2022 >60 >60 mL/min Final    Comment:    (NOTE) Calculated using the CKD-EPI Creatinine  Equation (2021)          Failed - Last BP in normal range    BP Readings from Last 1 Encounters:  05/18/23 (!) 140/76         Failed - Valid encounter within last 6 months    Recent Outpatient Visits           2 months ago Acute conjunctivitis of left eye, unspecified acute conjunctivitis type   [facility] Ed[facility] Marsa PARAS, DO   2 months ago Candidal intertrigo   [place] [facility] [place], Marsa PARAS, [place]

## 2023-07-20 NOTE — Telephone Encounter (Signed)
 reordered on 07/18/2023 by Larinda Alan HERO, CMA   Requested Prescriptions  Refused Prescriptions Disp Refills   metoprolol  succinate (TOPROL -XL) 25 MG 24 hr tablet [Pharmacy Med Name: Metoprolol  Succinate ER 25 MG Oral Tablet Extended Release 24 Hour] 90 tablet 0    Sig: Take 1 tablet by mouth once daily     There is no refill protocol information for this order

## 2023-08-10 ENCOUNTER — Other Ambulatory Visit: Payer: Self-pay | Admitting: Internal Medicine

## 2023-08-10 DIAGNOSIS — M62830 Muscle spasm of back: Secondary | ICD-10-CM

## 2023-08-10 DIAGNOSIS — S39012A Strain of muscle, fascia and tendon of lower back, initial encounter: Secondary | ICD-10-CM

## 2023-08-11 ENCOUNTER — Other Ambulatory Visit: Payer: Self-pay | Admitting: Family Medicine

## 2023-08-11 DIAGNOSIS — S39012A Strain of muscle, fascia and tendon of lower back, initial encounter: Secondary | ICD-10-CM

## 2023-08-11 DIAGNOSIS — M62830 Muscle spasm of back: Secondary | ICD-10-CM

## 2023-08-11 NOTE — Telephone Encounter (Signed)
 Requested Prescriptions  Refused Prescriptions Disp Refills   ibuprofen  (ADVIL ) 800 MG tablet 90 tablet 0    Sig: TAKE 1 TABLET BY MOUTH EVERY 8 HOURS AS NEEDED FOR MODERATE PAIN     Analgesics:  NSAIDS Failed - 08/11/2023  5:08 PM      Failed - Manual Review: Labs are only required if the patient has taken medication for more than 8 weeks.      Failed - Cr in normal range and within 360 days    Creat  Date Value Ref Range Status  03/05/2019 0.63 0.50 - 1.05 mg/dL Final    Comment:    For patients >20 years of age, the reference limit for Creatinine is approximately 13% higher for people identified as African-American. .    Creatinine, Ser  Date Value Ref Range Status  02/07/2022 0.79 0.44 - 1.00 mg/dL Final         Failed - HGB in normal range and within 360 days    Hemoglobin  Date Value Ref Range Status  02/07/2022 12.6 12.0 - 15.0 g/dL Final  97/98/7977 86.1 11.1 - 15.9 g/dL Final         Failed - PLT in normal range and within 360 days    Platelets  Date Value Ref Range Status  02/07/2022 246 150 - 400 K/uL Final  02/11/2020 228 150 - 450 x10E3/uL Final         Failed - HCT in normal range and within 360 days    HCT  Date Value Ref Range Status  02/07/2022 39.1 36.0 - 46.0 % Final   Hematocrit  Date Value Ref Range Status  02/11/2020 41.6 34.0 - 46.6 % Final         Failed - eGFR is 30 or above and within 360 days    GFR, Est African American  Date Value Ref Range Status  03/05/2019 115 > OR = 60 mL/min/1.33m2 Final   GFR calc Af Amer  Date Value Ref Range Status  02/11/2020 103 >59 mL/min/1.73 Final    Comment:    **In accordance with recommendations from the NKF-ASN Task force,**   [facility] is in the process of updating its eGFR calculation to the   2021 CKD-EPI creatinine equation that estimates kidney function   without a race variable.    GFR, Est Non African American  Date Value Ref Range Status  03/05/2019 99 > OR = 60 mL/min/1.91m2 Final    GFR, Estimated  Date Value Ref Range Status  02/07/2022 >60 >60 mL/min Final    Comment:    (NOTE) Calculated using the CKD-EPI Creatinine Equation (2021)          Failed - Valid encounter within last 12 months    Recent Outpatient Visits           2 months ago Acute conjunctivitis of left eye, unspecified acute conjunctivitis type   [place] [facility] Edman Marsa PARAS, DO   3 months ago Candidal intertrigo   [place] [facility] Edman Marsa PARAS, [place] - Patient is not pregnant

## 2023-08-11 NOTE — Telephone Encounter (Signed)
 Copied from CRM [id number]. Topic: Clinical - Medication Refill >> Aug 11, 2023  1:08 PM Tiffini S wrote: Medication: ibuprofen  (ADVIL ) 800 MG tablet  Has the patient contacted their pharmacy? Yes, was requested  by fax on 08/10/23 (Agent: If no, request that the patient contact the pharmacy for the refill. If patient does not wish to contact the pharmacy document the reason why and proceed with request.) (Agent: If yes, when and what did the pharmacy advise?)  This is the patient's preferred pharmacy:  [facility] [address] [place] [address] [place]) [place] [address] Phone: [phone] Fax: [phone]  Is this the correct pharmacy for this prescription? Yes If no, delete pharmacy and type the correct one.   Has the prescription been filled recently? Yes  Is the patient out of the medication? Yes, out of medication and need today  Has the patient been seen for an appointment in the last year OR does the patient have an upcoming appointment? Yes  Can we respond through MyChart? No, patient call back number is [phone]  Agent: Please be advised that Rx refills may take up to 3 business days. We ask that you follow-up with your pharmacy.

## 2023-08-11 NOTE — Telephone Encounter (Signed)
 Requested medication (s) are due for refill today: yes  Requested medication (s) are on the active medication list: yes  Last refill:  04/11/23  Future visit scheduled:   Notes to clinic:  Unable to refill per protocol due to failed labs, no updated results.      Requested Prescriptions  Pending Prescriptions Disp Refills   ibuprofen  (ADVIL ) 800 MG tablet [Pharmacy Med Name: Ibuprofen  800 MG Oral Tablet] 90 tablet 0    Sig: TAKE 1 TABLET BY MOUTH EVERY 8 HOURS AS NEEDED FOR MODERATE PAIN     Analgesics:  NSAIDS Failed - 08/11/2023 12:06 PM      Failed - Manual Review: Labs are only required if the patient has taken medication for more than 8 weeks.      Failed - Cr in normal range and within 360 days    Creat  Date Value Ref Range Status  03/05/2019 0.63 0.50 - 1.05 mg/dL Final    Comment:    For patients >61 years of age, the reference limit for Creatinine is approximately 13% higher for people identified as African-American. .    Creatinine, Ser  Date Value Ref Range Status  02/07/2022 0.79 0.44 - 1.00 mg/dL Final         Failed - HGB in normal range and within 360 days    Hemoglobin  Date Value Ref Range Status  02/07/2022 12.6 12.0 - 15.0 g/dL Final  97/98/7977 86.1 11.1 - 15.9 g/dL Final         Failed - PLT in normal range and within 360 days    Platelets  Date Value Ref Range Status  02/07/2022 246 150 - 400 K/uL Final  02/11/2020 228 150 - 450 x10E3/uL Final         Failed - HCT in normal range and within 360 days    HCT  Date Value Ref Range Status  02/07/2022 39.1 36.0 - 46.0 % Final   Hematocrit  Date Value Ref Range Status  02/11/2020 41.6 34.0 - 46.6 % Final         Failed - eGFR is 30 or above and within 360 days    GFR, Est African American  Date Value Ref Range Status  03/05/2019 115 > OR = 60 mL/min/1.28m2 Final   GFR calc Af Amer  Date Value Ref Range Status  02/11/2020 103 >59 mL/min/1.73 Final    Comment:    **In accordance with  recommendations from the NKF-ASN Task force,**   [facility] is in the process of updating its eGFR calculation to the   2021 CKD-EPI creatinine equation that estimates kidney function   without a race variable.    GFR, Est Non African American  Date Value Ref Range Status  03/05/2019 99 > OR = 60 mL/min/1.83m2 Final   GFR, Estimated  Date Value Ref Range Status  02/07/2022 >60 >60 mL/min Final    Comment:    (NOTE) Calculated using the CKD-EPI Creatinine Equation (2021)          Failed - Valid encounter within last 12 months    Recent Outpatient Visits           2 months ago Acute conjunctivitis of left eye, unspecified acute conjunctivitis type   [facility] PARAS, DO   3 months ago Candidal intertrigo   [place] [facility], Marsa PARAS, [place]  Passed - Patient is not pregnant

## 2023-09-06 ENCOUNTER — Ambulatory Visit: Payer: Self-pay

## 2023-09-06 NOTE — Telephone Encounter (Signed)
 FYI Only or Action Required?: FYI only for provider.  Patient was last seen in primary care on 05/18/2023 by Edman Marsa PARAS, DO.  Called Nurse Triage reporting Nasal Congestion.  Symptoms began several days ago.  Interventions attempted: OTC medications: Alka Seltzer cold/flu, Amoxicillin .  Symptoms are: stable.  Triage Disposition: See Physician Within 24 Hours  Patient/caregiver understands and will follow disposition?:      Copied from CRM [id number]. Topic: Clinical - Red Word Triage >> Sep 06, 2023  4:44 PM Nathanel BROCKS wrote: Red Word that prompted transfer to Nurse Triage:  Pt is having congestion, cough, mucus, fever, achey and feeling really bad. Has not taken a covid test  ----------------------------------------------------------------------- From previous Reason for Contact - Scheduling: Patient/patient representative is calling to schedule an appointment. Refer to attachments for appointment information. Reason for Disposition  Earache  Answer Assessment - Initial Assessment Questions Coughing up yellow phlegm. Had Amoxicillin  in the home, took some yesterday. Alka Seltzer cold/flu tablets.   1. LOCATION: Where does it hurt?      Whole head and face  2. ONSET: When did the sinus pain start?  (e.g., hours, days)      Monday afternoon  3. NASAL CONGESTION: Is the nose blocked? If Yes, ask: Can you open it or must you breathe through your mouth?     Has not been sick in almost a year  4. NASAL DISCHARGE: Do you have discharge from your nose? If so ask, What color?     No  5. FEVER: Do you have a fever? If Yes, ask: What is it, how was it measured, and when did it start?      100.8   6. OTHER SYMPTOMS: Do you have any other symptoms? (e.g., sore throat, cough, earache, difficulty breathing)     Sore throat, both ears aching, coughing, just over all feeling bad.  Protocols used: Sinus Pain or Congestion-A-AH

## 2023-09-07 ENCOUNTER — Ambulatory Visit (INDEPENDENT_AMBULATORY_CARE_PROVIDER_SITE_OTHER): Payer: Self-pay

## 2023-09-07 ENCOUNTER — Encounter: Payer: Self-pay | Admitting: Family Medicine

## 2023-09-07 VITALS — BP 132/74 | HR 83 | Temp 98.2°F

## 2023-09-07 DIAGNOSIS — L304 Erythema intertrigo: Secondary | ICD-10-CM | POA: Insufficient documentation

## 2023-09-07 DIAGNOSIS — J069 Acute upper respiratory infection, unspecified: Secondary | ICD-10-CM | POA: Insufficient documentation

## 2023-09-07 MED ORDER — MICONAZOLE NITRATE 2 % EX CREA: 1.0000 | TOPICAL_CREAM | Freq: Two times a day (BID) | CUTANEOUS | 1 refills | Status: DC

## 2023-09-07 MED ORDER — AMOXICILLIN-POT CLAVULANATE 875-125 MG PO TABS: 1.0000 | ORAL_TABLET | Freq: Two times a day (BID) | ORAL | 0 refills | Status: AC

## 2023-09-07 NOTE — Progress Notes (Signed)
 Acute Patient Visit  Physician: Neville Pauls A Valeda Corzine, MD  Patient: Shannon Small MRN: [id number] DOB: 1960-02-08 PCP: Edman Marsa PARAS, DO     Subjective:   Chief Complaint  Patient presents with   Acute Visit    Sore throat, head throbbing, chest congestion symptoms started Monday.    Rash    Rash under both breast and stomach     HPI: The patient is a 63 y.o. female who presents today for:   Discussed the use of AI scribe software for clinical note transcription with the patient, who gave verbal consent to proceed.  History of Present Illness   Shannon Small is a 63 year old female who presents with sinus congestion, sore throat, and ear pain.  Upper respiratory symptoms - Sinus congestion for three days - Sore throat for three days - Ear pain for three days - Burning and stinging sensation in the head - Cough began today, associated with back pain when coughing - Low-grade fever on the first day of illness - Nasal discharge is brown, especially in the morning - Sinus pressure localized under the eyes and forehead  Chronic sinus issues - History of sinus issues that worsen with weather changes  Medication use and response - Uses over-the-counter cold and flu medications, uncertain effectiveness - No known antibiotic allergies - Previously tolerated amoxicillin   Exposure history - No tobacco use - No known exposure to sick contacts except for husband, who had a cold on Sunday and recovered quickly          ROS:   As noted in the HPI    ASSESMENT/PLAN:  Encounter Diagnoses  Name Primary?   Intertrigo Yes   URTI (acute upper respiratory infection)     No orders of the defined types were placed in this encounter.   Assessment and Plan    Acute bacterial sinusitis with upper respiratory symptoms . - Subjective fever only. Likely bacterial sinusitis given purulent drainage and facial pain. Lungs are clear on examination. No  known antibiotic allergies. - Prescribe augmentin  - Advise use of saline rinses for nasal congestion. - Recommend ibuprofen  or Aleve  for inflammation and pain. - Instruct to monitor for worsening symptoms, such as increased shortness of breath or fatigue, and seek medical attention if symptoms worsen.  Intertrigo (fungal rash under breasts) Recurrent fungal rash under the breasts, exacerbated by moisture and sweating. Previous treatments included antifungal creams and powders with limited success. - Prescribe antifungal cream for intertrigo. - Advise use of powder to keep the area dry, especially at night.  Follow-Up Requires rest and should avoid work until symptoms improve. Needs a note for work absence until Tuesday. - Provide a work absence note until Tuesday. - Advise rest and adequate fluid intake. - Instruct to contact the clinic if there are any issues with medications or if symptoms worsen.            OBJECTIVE: Vitals:   09/07/23 0859  BP: 132/74  Pulse: 83  Temp: 98.2 F (36.8 C)  SpO2: 97%    There is no height or weight on file to calculate BMI.   Physical Exam Vitals reviewed.  Constitutional:      Appearance: Normal appearance. Well-developed with normal weight.  Cardiovascular:     Rate and Rhythm: Normal rate and regular rhythm. Normal heart sounds. Normal peripheral pulses Pulmonary:     Normal breath sounds with normal effort Skin:  General: Skin is warm and dry without noticeable rash. Neurological:     General: No focal deficit present.  Psychiatric:        Mood and Affect: Mood, behavior and cognition normal     HEENT:  + sinus tenderness with palpation frontal sinus, Normal TM, normal oropharynx  Allergies Patient is allergic to atorvastatin .  Past Medical History Patient  has a past medical history of Acid reflux (09/09/2014), Adjustment disorder with depressed mood (11/14/2006), Benign neoplasm of skin (11/14/2006), Cephalalgia  (02/10/2006), Chronic HFrEF (heart failure with reduced ejection fraction) (HCC), Chronic infection of sinus (09/09/2014), Extreme obesity (02/10/2006), Hypertension, LBP (low back pain) (01/15/2007), Mixed hyperlipidemia, NICM (nonischemic cardiomyopathy) (HCC), Nonobstructive CAD (coronary artery disease), and Sleep related leg cramps (09/09/2014).  Surgical History Patient  has a past surgical history that includes Abdominal hysterectomy; Rhinoplasty (1982); and RIGHT/LEFT HEART CATH AND CORONARY ANGIOGRAPHY (N/A, 02/17/2020).  Family History Pateint's family history includes Alcohol abuse in her brother; Breast cancer in her paternal aunt; Diabetes in her maternal grandmother and mother; Heart disease in her paternal grandmother; Kidney cancer in her father; Lung cancer in her paternal aunt; Stomach cancer in her father; Stroke in her maternal grandfather, maternal grandmother, and paternal grandfather.  Social History Patient  reports that she has never smoked. She has never used smokeless tobacco. She reports current alcohol use. She reports that she does not use drugs.    09/07/2023

## 2023-10-20 ENCOUNTER — Ambulatory Visit: Payer: Self-pay

## 2023-10-20 NOTE — Telephone Encounter (Signed)
 FYI Only or Action Required?: Action required by provider: request for appointment and clinical question for provider.  Patient was last seen in primary care on 09/07/2023 by Zafirov, Clarissa A, MD.  Called Nurse Triage reporting No chief complaint on file..  Symptoms began several days ago.  Interventions attempted: OTC medications: benadryl , anti itch lotion.  Symptoms are: gradually worsening.  Triage Disposition: See Physician Within 24 Hours  Patient/caregiver understands and will follow disposition?:   Copied from CRM [id number]. Topic: Clinical - Red Word Triage >> Oct 20, 2023  9:29 AM Tiffini S wrote: [facility] that prompted transfer to Nurse Triage: Patient called about a rash that started under her breast and have spread all over her body. Was prescribed a medication and will pick up today. Reason for Disposition  SEVERE itching (i.e., interferes with sleep, normal activities or school)  Answer Assessment - Initial Assessment Questions No available appts today pcp/clinic. Offered alternative provider, pt declined and reports have to be at work by [employer]; can't come in unless 8AM.  Patent reports already seen MD for rash, rash is now spreading.  Advised UC today and ED is symptoms worsen.  1. APPEARANCE of RASH: What does the rash look like? (e.g., blisters, dry flaky skin, red spots, redness, sores)     Red, flat, dry, itching sore; 2. SIZE: How big are the spots? (e.g., tip of pen, eraser, coin; inches, centimeters)     Some are big and little, like patches 3. LOCATION: Where is the rash located?     started under breast, chest, belly, both legs, back  4. COLOR: What color is the rash? (Note: It is difficult to assess rash color in people with darker-colored skin. When this situation occurs, simply ask the caller to describe what they see.)     red 5. ONSET: When did the rash begin?     Under breasts months ago, 2 weeks ago started spreading and getting  worse 6. FEVER: Do you have a fever? If Yes, ask: What is your temperature, how was it measured, and when did it start?     denies 7. ITCHING: Does the rash itch? If Yes, ask: How bad is the itch? (Scale 1-10; or mild, moderate, severe)     Moderate; burning 8. CAUSE: What do you think is causing the rash?     Not sure 9. MEDICINE FACTORS: Have you started any new medicines within the last 2 weeks? (e.g., antibiotics)      no 10. OTHER SYMPTOMS: Do you have any other symptoms? (e.g., dizziness, headache, sore throat, joint pain)       Denies diff breathing, dizziness, HA, sore throat, joint pain  Protocols used: Rash or Redness - [facility]

## 2023-10-20 NOTE — Telephone Encounter (Signed)
 Noted, can go to urgent care over the weekend if symptoms worsen

## 2023-11-05 ENCOUNTER — Other Ambulatory Visit: Payer: Self-pay | Admitting: Family Medicine

## 2023-11-05 DIAGNOSIS — I10 Essential (primary) hypertension: Secondary | ICD-10-CM

## 2023-11-06 ENCOUNTER — Other Ambulatory Visit: Payer: Self-pay | Admitting: Family Medicine

## 2023-11-06 DIAGNOSIS — I10 Essential (primary) hypertension: Secondary | ICD-10-CM

## 2023-11-06 NOTE — Telephone Encounter (Unsigned)
 Copied from CRM [id number]. Topic: Clinical - Medication Refill >> Nov 06, 2023 11:33 AM Kevelyn M wrote: Medication: spironolactone  (ALDACTONE ) 25 MG tablet, lisinopril  (ZESTRIL ) 40 MG tablet  Has the patient contacted their pharmacy? Yes (Agent: If no, request that the patient contact the pharmacy for the refill. If patient does not wish to contact the pharmacy document the reason why and proceed with request.) (Agent: If yes, when and what did the pharmacy advise?)  This is the patient's preferred pharmacy:  [facility] [address]), [place] - [address] [place]) [place] [address] Phone: [phone] Fax: [phone]  Is this the correct pharmacy for this prescription? Yes If no, delete pharmacy and type the correct one.   Has the prescription been filled recently? No  Is the patient out of the medication? Yes  Has the patient been seen for an appointment in the last year OR does the patient have an upcoming appointment? Yes  Can we respond through MyChart? Yes  Agent: Please be advised that Rx refills may take up to 3 business days. We ask that you follow-up with your pharmacy.

## 2023-11-07 NOTE — Telephone Encounter (Signed)
 Duplicate request 11/07/23.  Requested Prescriptions  Pending Prescriptions Disp Refills   lisinopril  (ZESTRIL ) 40 MG tablet 90 tablet 0    Sig: Take 1 tablet (40 mg total) by mouth daily.     Cardiovascular:  ACE Inhibitors Failed - 11/07/2023  4:19 PM      Failed - Cr in normal range and within 180 days    Creat  Date Value Ref Range Status  03/05/2019 0.63 0.50 - 1.05 mg/dL Final    Comment:    For patients >62 years of age, the reference limit for Creatinine is approximately 13% higher for people identified as African-American. .    Creatinine, Ser  Date Value Ref Range Status  02/07/2022 0.79 0.44 - 1.00 mg/dL Final         Failed - K in normal range and within 180 days    Potassium  Date Value Ref Range Status  02/07/2022 3.7 3.5 - 5.1 mmol/L Final  07/30/2013 3.7 3.5 - 5.1 mmol/L Final         Failed - Valid encounter within last 6 months    Recent Outpatient Visits           2 months ago Intertrigo   [place] [facility], [place] A, MD   5 months ago Acute conjunctivitis of left eye, unspecified acute conjunctivitis type   [facility] [place], Marsa PARAS, DO   6 months ago Candidal intertrigo    [facility] [place], Marsa PARAS, [place]              Passed - Patient is not pregnant      Passed - Last BP in normal range    BP Readings from Last 1 Encounters:  09/07/23 132/74          spironolactone  (ALDACTONE ) 25 MG tablet 90 tablet 0    Sig: Take 1 tablet (25 mg total) by mouth daily.     Cardiovascular: Diuretics - Aldosterone Antagonist Failed - 11/07/2023  4:19 PM      Failed - Cr in normal range and within 180 days    Creat  Date Value Ref Range Status  03/05/2019 0.63 0.50 - 1.05 mg/dL Final    Comment:    For patients >3 years of age, the reference limit for Creatinine is approximately 13% higher for people identified as African-American. .     Creatinine, Ser  Date Value Ref Range Status  02/07/2022 0.79 0.44 - 1.00 mg/dL Final         Failed - K in normal range and within 180 days    Potassium  Date Value Ref Range Status  02/07/2022 3.7 3.5 - 5.1 mmol/L Final  07/30/2013 3.7 3.5 - 5.1 mmol/L Final         Failed - Na in normal range and within 180 days    Sodium  Date Value Ref Range Status  02/07/2022 141 135 - 145 mmol/L Final  02/11/2020 137 134 - 144 mmol/L Final  07/30/2013 140 136 - 145 mmol/L Final         Failed - eGFR is 30 or above and within 180 days    GFR, Est African American  Date Value Ref Range Status  03/05/2019 115 > OR = 60 mL/min/1.39m2 Final   GFR calc Af Amer  Date Value Ref Range Status  02/11/2020 103 >59 mL/min/1.73 Final    Comment:    **In accordance with recommendations  from the NKF-ASN Task force,**   [facility] is in the process of updating its eGFR calculation to the   2021 CKD-EPI creatinine equation that estimates kidney function   without a race variable.    GFR, Est Non African American  Date Value Ref Range Status  03/05/2019 99 > OR = 60 mL/min/1.18m2 Final   GFR, Estimated  Date Value Ref Range Status  02/07/2022 >60 >60 mL/min Final    Comment:    (NOTE) Calculated using the CKD-EPI Creatinine Equation (2021)          Failed - Valid encounter within last 6 months    Recent Outpatient Visits           2 months ago Intertrigo   [place] [facility] [place], [place] A, MD   5 months ago Acute conjunctivitis of left eye, unspecified acute conjunctivitis type   [place] [facility] [place] Marsa PARAS, DO   6 months ago Candidal intertrigo   [place] [facility] [place] Marsa PARAS, DO              Passed - Last BP in normal range    BP Readings from Last 1 Encounters:  09/07/23 132/74

## 2023-11-07 NOTE — Telephone Encounter (Signed)
 Requested medications are due for refill today.  yes  Requested medications are on the active medications list.  yes  Last refill. Both refilled 07/18/2023 #90 0 rf  Future visit scheduled.   no  Notes to clinic.  Labs are expired.    Requested Prescriptions  Pending Prescriptions Disp Refills   spironolactone  (ALDACTONE ) 25 MG tablet [Pharmacy Med Name: Spironolactone  25 MG Oral Tablet] 90 tablet 0    Sig: Take 1 tablet by mouth once daily     Cardiovascular: Diuretics - Aldosterone Antagonist Failed - 11/07/2023 12:18 PM      Failed - Cr in normal range and within 180 days    Creat  Date Value Ref Range Status  03/05/2019 0.63 0.50 - 1.05 mg/dL Final    Comment:    For patients >37 years of age, the reference limit for Creatinine is approximately 13% higher for people identified as African-American. .    Creatinine, Ser  Date Value Ref Range Status  02/07/2022 0.79 0.44 - 1.00 mg/dL Final         Failed - K in normal range and within 180 days    Potassium  Date Value Ref Range Status  02/07/2022 3.7 3.5 - 5.1 mmol/L Final  07/30/2013 3.7 3.5 - 5.1 mmol/L Final         Failed - Na in normal range and within 180 days    Sodium  Date Value Ref Range Status  02/07/2022 141 135 - 145 mmol/L Final  02/11/2020 137 134 - 144 mmol/L Final  07/30/2013 140 136 - 145 mmol/L Final         Failed - eGFR is 30 or above and within 180 days    GFR, Est African American  Date Value Ref Range Status  03/05/2019 115 > OR = 60 mL/min/1.37m2 Final   GFR calc Af Amer  Date Value Ref Range Status  02/11/2020 103 >59 mL/min/1.73 Final    Comment:    **In accordance with recommendations from the NKF-ASN Task force,**   [facility] is in the process of updating its eGFR calculation to the   2021 CKD-EPI creatinine equation that estimates kidney function   without a race variable.    GFR, Est Non African American  Date Value Ref Range Status  03/05/2019 99 > OR = 60 mL/min/1.29m2  Final   GFR, Estimated  Date Value Ref Range Status  02/07/2022 >60 >60 mL/min Final    Comment:    (NOTE) Calculated using the CKD-EPI Creatinine Equation (2021)          Failed - Valid encounter within last 6 months    Recent Outpatient Visits           2 months ago Intertrigo   [place] [facility], [place] A, MD   5 months ago Acute conjunctivitis of left eye, unspecified acute conjunctivitis type   [facility] [place] Marsa PARAS, DO   6 months ago Candidal intertrigo   [place] [facility] [place] Marsa PARAS, DO              Passed - Last BP in normal range    BP Readings from Last 1 Encounters:  09/07/23 132/74          lisinopril  (ZESTRIL ) 40 MG tablet [Pharmacy Med Name: Lisinopril  40 MG Oral Tablet] 90 tablet 0    Sig: Take 1 tablet by mouth once daily     Cardiovascular:  ACE Inhibitors Failed - 11/07/2023 12:18 PM      Failed - Cr in normal range and within 180 days    Creat  Date Value Ref Range Status  03/05/2019 0.63 0.50 - 1.05 mg/dL Final    Comment:    For patients >24 years of age, the reference limit for Creatinine is approximately 13% higher for people identified as African-American. .    Creatinine, Ser  Date Value Ref Range Status  02/07/2022 0.79 0.44 - 1.00 mg/dL Final         Failed - K in normal range and within 180 days    Potassium  Date Value Ref Range Status  02/07/2022 3.7 3.5 - 5.1 mmol/L Final  07/30/2013 3.7 3.5 - 5.1 mmol/L Final         Failed - Valid encounter within last 6 months    Recent Outpatient Visits           2 months ago Intertrigo   [place] [facility] [place], [place] A, MD   5 months ago Acute conjunctivitis of left eye, unspecified acute conjunctivitis type   [place] [facility] [place] Marsa PARAS, DO   6 months ago Candidal intertrigo    [facility] [place], Marsa PARAS, [place] - Patient is not pregnant      Passed - Last BP in normal range    BP Readings from Last 1 Encounters:  09/07/23 132/74

## 2023-11-15 ENCOUNTER — Encounter: Payer: Self-pay | Admitting: Family Medicine

## 2023-11-15 DIAGNOSIS — Z0279 Encounter for issue of other medical certificate: Secondary | ICD-10-CM

## 2023-11-25 ENCOUNTER — Other Ambulatory Visit: Payer: Self-pay

## 2023-11-29 ENCOUNTER — Other Ambulatory Visit: Payer: Self-pay | Admitting: Family Medicine

## 2023-11-29 DIAGNOSIS — M62830 Muscle spasm of back: Secondary | ICD-10-CM

## 2023-11-29 DIAGNOSIS — S39012A Strain of muscle, fascia and tendon of lower back, initial encounter: Secondary | ICD-10-CM

## 2023-12-01 NOTE — Telephone Encounter (Signed)
 Requested medication (s) are due for refill today: na   Requested medication (s) are on the active medication list: yes  ibuprofen , no norflex   Last refill:  08/11/23 #90 0 refills  Future visit scheduled: no   Notes to clinic:  protocol failed last labs 02/07/22 no refills remain for ibuprofen . Norflex  not delegated per protocol, discontinued 01/31/23. Do you want to refill ibuprofen ?     Requested Prescriptions  Pending Prescriptions Disp Refills   ibuprofen  (ADVIL ) 800 MG tablet [Pharmacy Med Name: Ibuprofen  800 MG Oral Tablet] 90 tablet 0    Sig: TAKE 1 TABLET BY MOUTH EVERY 8 HOURS AS NEEDED FOR MODERATE PAIN     Analgesics:  NSAIDS Failed - 12/01/2023  4:13 PM      Failed - Manual Review: Labs are only required if the patient has taken medication for more than 8 weeks.      Failed - Cr in normal range and within 360 days    Creat  Date Value Ref Range Status  03/05/2019 0.63 0.50 - 1.05 mg/dL Final    Comment:    For patients >50 years of age, the reference limit for Creatinine is approximately 13% higher for people identified as African-American. .    Creatinine, Ser  Date Value Ref Range Status  02/07/2022 0.79 0.44 - 1.00 mg/dL Final         Failed - HGB in normal range and within 360 days    Hemoglobin  Date Value Ref Range Status  02/07/2022 12.6 12.0 - 15.0 g/dL Final  97/98/7977 86.1 11.1 - 15.9 g/dL Final         Failed - PLT in normal range and within 360 days    Platelets  Date Value Ref Range Status  02/07/2022 246 150 - 400 K/uL Final  02/11/2020 228 150 - 450 x10E3/uL Final         Failed - HCT in normal range and within 360 days    HCT  Date Value Ref Range Status  02/07/2022 39.1 36.0 - 46.0 % Final   Hematocrit  Date Value Ref Range Status  02/11/2020 41.6 34.0 - 46.6 % Final         Failed - eGFR is 30 or above and within 360 days    GFR, Est African American  Date Value Ref Range Status  03/05/2019 115 > OR = 60 mL/min/1.30m2 Final    GFR calc Af Amer  Date Value Ref Range Status  02/11/2020 103 >59 mL/min/1.73 Final    Comment:    **In accordance with recommendations from the NKF-ASN Task force,**   [facility] is in the process of updating its eGFR calculation to the   2021 CKD-EPI creatinine equation that estimates kidney function   without a race variable.    GFR, Est Non African American  Date Value Ref Range Status  03/05/2019 99 > OR = 60 mL/min/1.30m2 Final   GFR, Estimated  Date Value Ref Range Status  02/07/2022 >60 >60 mL/min Final    Comment:    (NOTE) Calculated using the CKD-EPI Creatinine Equation (2021)          Failed - Valid encounter within last 12 months    Recent Outpatient Visits           2 months ago Intertrigo   [place] [facility], [place] A, MD   6 months ago Acute conjunctivitis of left eye, unspecified acute conjunctivitis type   [place] [facility] [place] Marsa PARAS, DO   7 months ago Candidal intertrigo   [place] [facility] [place], Marsa PARAS, [place]              Ejddzi - Patient is not pregnant       orphenadrine  (NORFLEX ) 100 MG tablet [Pharmacy Med Name: Orphenadrine  Citrate ER 100 MG Oral Tablet Extended Release 12 Hour] 60 tablet 0    Sig: TAKE 1 TABLET BY MOUTH TWICE DAILY AS NEEDED FOR MUSCLE SPASM     Not Delegated - Analgesics:  Muscle Relaxants Failed - 12/01/2023  4:13 PM      Failed - This refill cannot be delegated      Failed - Valid encounter within last 6 months    Recent Outpatient Visits           2 months ago Intertrigo   [place] [facility], [place] A, MD   6 months ago Acute conjunctivitis of left eye, unspecified acute conjunctivitis type   [facility] [place] Marsa PARAS, DO   7 months ago Candidal intertrigo   [place] [facility] [address] [facility] [place], Marsa PARAS, [place]

## 2023-12-06 ENCOUNTER — Other Ambulatory Visit: Payer: Self-pay | Admitting: Family Medicine

## 2023-12-06 DIAGNOSIS — S39012A Strain of muscle, fascia and tendon of lower back, initial encounter: Secondary | ICD-10-CM

## 2023-12-06 DIAGNOSIS — M62830 Muscle spasm of back: Secondary | ICD-10-CM

## 2023-12-06 NOTE — Telephone Encounter (Signed)
 Patient requesting refill of medication not on profile. orphenadrine  (NORFLEX ) 100 MG tablet also.

## 2023-12-06 NOTE — Telephone Encounter (Unsigned)
 Copied from CRM #[id number]. Topic: Clinical - Medication Refill >> Dec 06, 2023 10:52 AM Amy B wrote: Medication:  ibuprofen  (ADVIL ) 800 MG tablet  orphenadrine  (NORFLEX ) 100 MG tablet    Has the patient contacted their pharmacy? Yes (Agent: If no, request that the patient contact the pharmacy for the refill. If patient does not wish to contact the pharmacy document the reason why and proceed with request.) (Agent: If yes, when and what did the pharmacy advise?)  This is the patient's preferred pharmacy:  [facility] [address] ([address]), [place] - [address] [place]) [place] [address] Phone: [phone] Fax: [phone]  Is this the correct pharmacy for this prescription? Yes If no, delete pharmacy and type the correct one.   Has the prescription been filled recently? Yes  Is the patient out of the medication? Yes  Has the patient been seen for an appointment in the last year OR does the patient have an upcoming appointment? Yes  Can we respond through MyChart? No  Agent: Please be advised that Rx refills may take up to 3 business days. We ask that you follow-up with your pharmacy.

## 2023-12-11 ENCOUNTER — Telehealth: Payer: Self-pay

## 2023-12-11 ENCOUNTER — Other Ambulatory Visit: Payer: Self-pay | Admitting: Family Medicine

## 2023-12-11 ENCOUNTER — Other Ambulatory Visit: Payer: Self-pay

## 2023-12-11 DIAGNOSIS — S39012A Strain of muscle, fascia and tendon of lower back, initial encounter: Secondary | ICD-10-CM

## 2023-12-11 DIAGNOSIS — M62830 Muscle spasm of back: Secondary | ICD-10-CM

## 2023-12-11 DIAGNOSIS — L209 Atopic dermatitis, unspecified: Secondary | ICD-10-CM

## 2023-12-11 MED ORDER — MICONAZOLE NITRATE 2 % EX CREA: 1.0000 | TOPICAL_CREAM | Freq: Two times a day (BID) | CUTANEOUS | 1 refills | Status: AC

## 2023-12-11 MED ORDER — TRIAMCINOLONE ACETONIDE 0.5 % EX CREA: 1.0000 | TOPICAL_CREAM | Freq: Two times a day (BID) | CUTANEOUS | 1 refills | Status: DC

## 2023-12-11 MED ORDER — IBUPROFEN 800 MG PO TABS: ORAL_TABLET | ORAL | 2 refills | Status: DC

## 2023-12-11 MED ORDER — ORPHENADRINE CITRATE ER 100 MG PO TB12: 100.0000 mg | ORAL_TABLET | Freq: Two times a day (BID) | ORAL | 2 refills | Status: DC

## 2023-12-11 NOTE — Telephone Encounter (Signed)
 Spoke to patient, notified prescriptions sent to pharmacy. She also wanted her creams refilled. They were also sent in.

## 2023-12-11 NOTE — Telephone Encounter (Signed)
 Copied from CRM #[id number]. Topic: Clinical - Prescription Issue >> Dec 11, 2023 10:30 AM Tobias CROME wrote: Reason for CRM: Patient inquiring why her prescription for ibuprofen  and orphenadrine  (NORFLEX ) 100 MG tablet has not been refilled. Patient states she always gets this refilled and would like clarification. Patient has gone two weeks without it.   Requesting callback: [phone]

## 2023-12-11 NOTE — Telephone Encounter (Signed)
 Please let her know that the most recent request was submitted on 11/26, we were closed for Thanksgiving starting on 11/27.  The refills were not processed in time. That is the best I can tell.  I have signed the orders now. Thank you  Marsa Officer, DO [facility] 12/11/2023, 12:09 PM

## 2023-12-11 NOTE — Addendum Note (Signed)
 Addended by: EDMAN MARSA PARAS on: 12/11/2023 12:09 PM   Modules accepted: Orders

## 2023-12-13 ENCOUNTER — Telehealth: Payer: Self-pay

## 2023-12-13 NOTE — Telephone Encounter (Signed)
 Copied from CRM #[id number]. Topic: General - Other >> Dec 13, 2023 11:56 AM Montie POUR wrote: Reason for CRM:  Carlon needs a letter or form to renew her handicap sticker for her car. Please call Luciana at [phone] to discuss when she can get it. Her sticker has expired. Thanks

## 2023-12-13 NOTE — Telephone Encounter (Signed)
 Yes okay to renew Handicap Placard.  Sure. If you can place a form in my inbox with her name on it that would be good. I will complete it when I return on Monday and she can pick up anytime starting Tuesday or later next week.  Marsa Officer, DO [facility] 12/13/2023, 3:15 PM

## 2023-12-18 NOTE — Telephone Encounter (Signed)
 Completed. To be mailed  Marsa Officer, DO [facility] 12/18/2023, 11:50 AM

## 2023-12-21 ENCOUNTER — Telehealth: Payer: Self-pay

## 2023-12-21 DIAGNOSIS — L309 Dermatitis, unspecified: Secondary | ICD-10-CM

## 2023-12-21 MED ORDER — TRIAMCINOLONE ACETONIDE 0.1 % EX CREA: 1.0000 | TOPICAL_CREAM | Freq: Two times a day (BID) | CUTANEOUS | 3 refills | Status: DC

## 2023-12-21 NOTE — Addendum Note (Signed)
 Addended by: EDMAN MARSA PARAS on: 12/21/2023 11:43 AM   Modules accepted: Orders

## 2023-12-21 NOTE — Telephone Encounter (Signed)
 Copied from CRM #[id number]. Topic: Clinical - Prescription Issue >> Dec 21, 2023 11:15 AM Tinnie BROCKS wrote: Reason for CRM: Jasmine with [facility] calling to let us  know that the triamcinolone  cream does not come in a .5 % unless it is sent in as ointment. Options for the cream strength are .1 and .25 [facility] [address]), [place] - [address]) [place] [address] Phone: [phone] Fax: [phone]

## 2024-01-14 ENCOUNTER — Other Ambulatory Visit: Payer: Self-pay | Admitting: Family Medicine

## 2024-01-14 DIAGNOSIS — I10 Essential (primary) hypertension: Secondary | ICD-10-CM

## 2024-01-16 NOTE — Telephone Encounter (Signed)
 Rx 11/07/23 #90- too soon Requested Prescriptions  Pending Prescriptions Disp Refills   lisinopril  (ZESTRIL ) 40 MG tablet [Pharmacy Med Name: Lisinopril  40 MG Oral Tablet] 90 tablet 0    Sig: Take 1 tablet by mouth once daily     Cardiovascular:  ACE Inhibitors Failed - 01/16/2024 11:34 AM      Failed - Cr in normal range and within 180 days    Creat  Date Value Ref Range Status  03/05/2019 0.63 0.50 - 1.05 mg/dL Final    Comment:    For patients >20 years of age, the reference limit for Creatinine is approximately 13% higher for people identified as African-American. .    Creatinine, Ser  Date Value Ref Range Status  02/07/2022 0.79 0.44 - 1.00 mg/dL Final         Failed - K in normal range and within 180 days    Potassium  Date Value Ref Range Status  02/07/2022 3.7 3.5 - 5.1 mmol/L Final  07/30/2013 3.7 3.5 - 5.1 mmol/L Final         Failed - Valid encounter within last 6 months    Recent Outpatient Visits           4 months ago Intertrigo   [place] [facility], [place] A, MD   8 months ago Acute conjunctivitis of left eye, unspecified acute conjunctivitis type   [place] [facility] [place], Marsa PARAS, DO   8 months ago Candidal intertrigo   [place] [facility] [place] Marsa PARAS, [place]              Passed - Patient is not pregnant      Passed - Last BP in normal range    BP Readings from Last 1 Encounters:  09/07/23 132/74          spironolactone  (ALDACTONE ) 25 MG tablet [Pharmacy Med Name: Spironolactone  25 MG Oral Tablet] 90 tablet 0    Sig: Take 1 tablet by mouth once daily     Cardiovascular: Diuretics - Aldosterone Antagonist Failed - 01/16/2024 11:34 AM      Failed - Cr in normal range and within 180 days    Creat  Date Value Ref Range Status  03/05/2019 0.63 0.50 - 1.05 mg/dL Final    Comment:    For patients >66 years of age, the reference limit for Creatinine is  approximately 13% higher for people identified as African-American. .    Creatinine, Ser  Date Value Ref Range Status  02/07/2022 0.79 0.44 - 1.00 mg/dL Final         Failed - K in normal range and within 180 days    Potassium  Date Value Ref Range Status  02/07/2022 3.7 3.5 - 5.1 mmol/L Final  07/30/2013 3.7 3.5 - 5.1 mmol/L Final         Failed - Na in normal range and within 180 days    Sodium  Date Value Ref Range Status  02/07/2022 141 135 - 145 mmol/L Final  02/11/2020 137 134 - 144 mmol/L Final  07/30/2013 140 136 - 145 mmol/L Final         Failed - eGFR is 30 or above and within 180 days    GFR, Est African American  Date Value Ref Range Status  03/05/2019 115 > OR = 60 mL/min/1.61m2 Final   GFR calc Af Amer  Date Value Ref Range Status  02/11/2020 103 >59 mL/min/1.73 Final  Comment:    **In accordance with recommendations from the NKF-ASN Task force,**   [facility] is in the process of updating its eGFR calculation to the   2021 CKD-EPI creatinine equation that estimates kidney function   without a race variable.    GFR, Est Non African American  Date Value Ref Range Status  03/05/2019 99 > OR = 60 mL/min/1.33m2 Final   GFR, Estimated  Date Value Ref Range Status  02/07/2022 >60 >60 mL/min Final    Comment:    (NOTE) Calculated using the CKD-EPI Creatinine Equation (2021)          Failed - Valid encounter within last 6 months    Recent Outpatient Visits           4 months ago Intertrigo   [place] [facility] [place], [place] A, MD   8 months ago Acute conjunctivitis of left eye, unspecified acute conjunctivitis type   [place] [facility] [place] Marsa PARAS, DO   8 months ago Candidal intertrigo   [place] [facility] [place] Marsa PARAS, DO              Passed - Last BP in normal range    BP Readings from Last 1 Encounters:  09/07/23 132/74

## 2024-01-30 ENCOUNTER — Other Ambulatory Visit: Payer: Self-pay | Admitting: Family Medicine

## 2024-01-30 ENCOUNTER — Telehealth: Payer: Self-pay | Admitting: Family Medicine

## 2024-01-30 DIAGNOSIS — I10 Essential (primary) hypertension: Secondary | ICD-10-CM

## 2024-01-30 NOTE — Telephone Encounter (Signed)
 Spoke with patient, notified her we can send in a refill until her appointment. Appointment has been made.

## 2024-01-30 NOTE — Telephone Encounter (Signed)
 The BP medications were denied since she has not had blood drawn in 2 years and is overdue for preventative routine visit either a BP follow-up or a physical. I can send one last 90 day rx only for now until she schedules apt  Shannon Officer, DO [facility] 01/30/2024, 9:35 AM

## 2024-01-30 NOTE — Telephone Encounter (Signed)
 Pt  called requesting we  follow up on refill that was sent over

## 2024-02-14 ENCOUNTER — Ambulatory Visit: Payer: Self-pay | Admitting: Family Medicine

## 2024-02-14 ENCOUNTER — Encounter: Payer: Self-pay | Admitting: Family Medicine

## 2024-02-14 VITALS — BP 138/84 | HR 87 | Ht 65.0 in | Wt 322.5 lb

## 2024-02-14 DIAGNOSIS — E782 Mixed hyperlipidemia: Secondary | ICD-10-CM

## 2024-02-14 DIAGNOSIS — B372 Candidiasis of skin and nail: Secondary | ICD-10-CM

## 2024-02-14 DIAGNOSIS — L309 Dermatitis, unspecified: Secondary | ICD-10-CM

## 2024-02-14 DIAGNOSIS — S39012A Strain of muscle, fascia and tendon of lower back, initial encounter: Secondary | ICD-10-CM

## 2024-02-14 DIAGNOSIS — I10 Essential (primary) hypertension: Secondary | ICD-10-CM

## 2024-02-14 DIAGNOSIS — M62830 Muscle spasm of back: Secondary | ICD-10-CM

## 2024-02-14 DIAGNOSIS — J0110 Acute frontal sinusitis, unspecified: Secondary | ICD-10-CM

## 2024-02-14 DIAGNOSIS — L299 Pruritus, unspecified: Secondary | ICD-10-CM

## 2024-02-14 DIAGNOSIS — R7309 Other abnormal glucose: Secondary | ICD-10-CM

## 2024-02-14 LAB — COMPREHENSIVE METABOLIC PANEL WITH GFR
AG Ratio: 1.4 (calc) (ref 1.0–2.5)
ALT: 10 U/L (ref 6–29)
AST: 14 U/L (ref 10–35)
Albumin: 4.1 g/dL (ref 3.6–5.1)
Alkaline phosphatase (APISO): 89 U/L (ref 37–153)
BUN/Creatinine Ratio: 28 (calc) — ABNORMAL HIGH (ref 6–22)
BUN: 26 mg/dL — ABNORMAL HIGH (ref 7–25)
CO2: 26 mmol/L (ref 20–32)
Calcium: 9.4 mg/dL (ref 8.6–10.4)
Chloride: 105 mmol/L (ref 98–110)
Creat: 0.94 mg/dL (ref 0.50–1.05)
Globulin: 2.9 g/dL (ref 1.9–3.7)
Glucose, Bld: 92 mg/dL (ref 65–99)
Potassium: 4.5 mmol/L (ref 3.5–5.3)
Sodium: 139 mmol/L (ref 135–146)
Total Bilirubin: 0.6 mg/dL (ref 0.2–1.2)
Total Protein: 7 g/dL (ref 6.1–8.1)
eGFR: 68 mL/min/{1.73_m2}

## 2024-02-14 LAB — LIPID PANEL
Cholesterol: 181 mg/dL
HDL: 73 mg/dL
LDL Cholesterol (Calc): 90 mg/dL
Non-HDL Cholesterol (Calc): 108 mg/dL
Total CHOL/HDL Ratio: 2.5 (calc)
Triglycerides: 86 mg/dL

## 2024-02-14 LAB — HEMOGLOBIN A1C
Hgb A1c MFr Bld: 5.4 %
Mean Plasma Glucose: 108 mg/dL
eAG (mmol/L): 6 mmol/L

## 2024-02-14 MED ORDER — FLUCONAZOLE 150 MG PO TABS: 300.0000 mg | ORAL_TABLET | ORAL | 1 refills | Status: AC

## 2024-02-14 MED ORDER — IBUPROFEN 800 MG PO TABS: ORAL_TABLET | ORAL | 3 refills | Status: AC

## 2024-02-14 MED ORDER — SPIRONOLACTONE 25 MG PO TABS: 25.0000 mg | ORAL_TABLET | Freq: Every day | ORAL | 3 refills | Status: AC

## 2024-02-14 MED ORDER — TRIAMCINOLONE ACETONIDE 0.5 % EX CREA: 1.0000 | TOPICAL_CREAM | Freq: Two times a day (BID) | CUTANEOUS | 1 refills | Status: AC

## 2024-02-14 MED ORDER — METOPROLOL SUCCINATE ER 25 MG PO TB24: 25.0000 mg | ORAL_TABLET | Freq: Every day | ORAL | 3 refills | Status: AC

## 2024-02-14 MED ORDER — LISINOPRIL 40 MG PO TABS: 40.0000 mg | ORAL_TABLET | Freq: Every day | ORAL | 3 refills | Status: AC

## 2024-02-14 MED ORDER — ORPHENADRINE CITRATE ER 100 MG PO TB12: 100.0000 mg | ORAL_TABLET | Freq: Two times a day (BID) | ORAL | 3 refills | Status: AC

## 2024-02-14 MED ORDER — AMOXICILLIN-POT CLAVULANATE 875-125 MG PO TABS: 1.0000 | ORAL_TABLET | Freq: Two times a day (BID) | ORAL | 1 refills | Status: AC

## 2024-02-14 NOTE — Patient Instructions (Addendum)
 Thank you for coming to the office today.  For sinus Start augmentin   Refilled all medications today  For yeast infection Take Diflucan   The rash looks most consistent with eczema, this can flare up and get worse due to a variety of factors (excessive dry skin from bathing/showering, soaps, cold weather / indoor heaters, outdoor exposures).  Use the topical steroid creams twice a day for up to 1 week, maximum duration of use per one flare is 10 to 14 days, then STOP using it and allow skin to recover. Caution with over-use may cause lightening of the skin.   Hydrocortisone  on face only and the Triamcinolone  / Kenalog  on body only.  Tips to reduce Eczema Flares: For baths/showers, limit bathing to every other day if you can (max 1 x daily)  Use a gentle, unscented soap and lukewarm water (hot water is most irritating to skin) Never scrub skin with too much pressure, this causes more irritation. Pat skin dry, then leave it slightly damp. DO NOT scrub it dry. Apply steroid cream to skin and rub in all the way, wait 15 min, then apply a daily moisturizer (Vaseline, Eucerin, Aveeno). Continue daily moisturizer every day of the year (even after flare is resolved) - If you have eczema on hands or dry hands, recommend wearing any type of gloves overnight (cloth, fabric, or even nitrile/latex) to improve effect of topical moisturizer  If develops redness, honey colored crust oozing, drainage of pus, bleeding, or redness / swelling, pain, please return for re-evaluation, may have become infected after scratching.  ----------   You have been referred for a Coronary Calcium  Score Cardiac CT Scan. This is a screening test for patients aged 31-50+ with cardiovascular risk factors or who are healthy but would be interested in Cardiovascular Screening for heart disease. Even if there is a family history of heart disease, this imaging can be useful. Typically it can be done every 5+ years or at a  different timeline we agree on  The scan will look at the chest and mainly focus on the heart and identify early signs of calcium  build up or blockages within the heart arteries. It is not 100% accurate for identifying blockages or heart disease, but it is useful to help us  predict who may have some early changes or be at risk in the future for a heart attack or cardiovascular problem.  The results are reviewed by a Cardiologist and they will document the results. It should become available on MyChart. Typically the results are divided into percentiles based on other patients of the same demographic and age. So it will compare your risk to others similar to you. If you have a higher score >99 or higher percentile >75%tile, it is recommended to consider Statin cholesterol therapy and or referral to Cardiologist. I will try to help explain your results and if we have questions we can contact the Cardiologist.  You will be contacted for scheduling. Usually it is done at any imaging facility through [facility], [facility] or [facility].  The cost is $99 flat fee total and it does not go through insurance, so no authorization is required.   Please schedule a Follow-up Appointment to: Return in about 6 months (around 08/13/2024).  If you have any other questions or concerns, please feel free to call the office or send a message through MyChart. You may also schedule an earlier appointment if necessary.  Additionally, you may be receiving a survey about your  experience at our office within a few days to 1 week by e-mail or mail. We value your feedback.  Marsa Officer, DO [facility], [place]

## 2024-02-14 NOTE — Progress Notes (Signed)
 "  Subjective:    Patient ID: Shannon Small, female    DOB: 1960/03/27, 64 y.o.   MRN: [id number]  Shannon Small is a 64 y.o. female presenting on 02/14/2024 for Medical Management of Chronic Issues   HPI  Discussed the use of AI scribe software for clinical note transcription with the patient, who gave verbal consent to proceed.  History of Present Illness   Shannon Small is a 64 year old female with hypertension who presents for follow-up on blood pressure and skin issues.  Intertrigo and cutaneous fungal infection - Ongoing intertrigo under both breasts causing significant pruritus and discomfort - No relief with triamcinolone  cream - Requests medication to address presumed yeast infection  Eczema Xerosis and pruritus - Pruritus and xerosis on back - Symptoms attributed to cold weather and dry air - Uses antihistamines for pruritus - Has used topical Triamcinolone  0.1% askign about dose adjust  Nasal congestion and epistaxis - Nasal congestion and epistaxis with scab formation when blowing nose - Symptoms began approximately two weeks ago - No regular use of nasal moisturizers  Hypertension and edema - Treated with metoprolol  and spironolactone  - No significant peripheral edema, though some fluctuation day to day - Elevates legs when possible  Chronic Back Pain / Muscle Spasm Needs refill on muscle relaxant therapy.  Moribd Obesity BMI >53 Osteoarthritis and weight management - Knee pain described as 'bone to bone' - Attempting weight loss in preparation for knee surgery - Frequent urination - No current insurance coverage, limiting access to routine blood work  Recent bereavement - Recent loss of mother who had dementia     Hyperlipidemia Due labs, ordered      02/14/2024    8:07 AM 05/18/2023    9:58 AM 05/03/2023    9:04 AM  Depression screen PHQ 2/9  Decreased Interest 0 0 1  Down, Depressed, Hopeless 1 1 0  PHQ - 2 Score 1 1 1   Altered sleeping 0 1 1   Tired, decreased energy 1 1 2   Change in appetite 0 0 0  Feeling bad or failure about yourself  1 0 1  Trouble concentrating 0 0 0  Moving slowly or fidgety/restless 0 0 0  Suicidal thoughts 0 0 0  PHQ-9 Score 3 3  5    Difficult doing work/chores Somewhat difficult Not difficult at all Not difficult at all     Data saved with a previous flowsheet row definition       02/14/2024    8:07 AM 05/18/2023    9:59 AM 05/03/2023    9:04 AM 03/18/2022   11:12 AM  GAD 7 : Generalized Anxiety Score  Nervous, Anxious, on Edge 0 0  1  0   Control/stop worrying 1 1  1   0   Worry too much - different things 2 1  1  1    Trouble relaxing 1 1  1  1    Restless 0 0  0  0   Easily annoyed or irritable 1 1  2  1    Afraid - awful might happen 0 0  0  0   Total GAD 7 Score 5 4 6 3   Anxiety Difficulty Not difficult at all Not difficult at all Somewhat difficult Somewhat difficult     Data saved with a previous flowsheet row definition   Current Medications[1]   Social History[2]  Review of Systems Per HPI unless specifically indicated above     Objective:  BP 138/84 (BP Location: Left Wrist, Patient Position: Sitting, Cuff Size: Normal)   Pulse 87   Ht 5' 5 (1.651 m)   Wt (!) 322 lb 8 oz (146.3 kg)   SpO2 96%   BMI 53.67 kg/m   Wt Readings from Last 3 Encounters:  02/14/24 (!) 322 lb 8 oz (146.3 kg)  05/18/23 (!) 335 lb (152 kg)  05/03/23 (!) 336 lb (152.4 kg)    Physical Exam Vitals and nursing note reviewed.  Constitutional:      General: She is not in acute distress.    Appearance: She is well-developed. She is obese. She is not diaphoretic.     Comments: Well-appearing, comfortable, cooperative  HENT:     Head: Normocephalic and atraumatic.  Eyes:     General:        Right eye: No discharge.        Left eye: No discharge.     Conjunctiva/sclera: Conjunctivae normal.  Neck:     Thyroid: No thyromegaly.  Cardiovascular:     Rate and Rhythm: Normal rate and regular rhythm.      Heart sounds: Normal heart sounds. No murmur heard. Pulmonary:     Effort: Pulmonary effort is normal. No respiratory distress.     Breath sounds: Normal breath sounds. No wheezing or rales.  Musculoskeletal:        General: Normal range of motion.     Cervical back: Normal range of motion and neck supple.     Right lower leg: Edema (trace to +1) present.     Left lower leg: Edema present.  Lymphadenopathy:     Cervical: No cervical adenopathy.  Skin:    General: Skin is warm and dry.     Findings: Rash (eczema dry patches both upper extremities, candidal rash on chest wall, no sensitive exam performed, patient remained covered) present. No erythema.  Neurological:     Mental Status: She is alert and oriented to person, place, and time.  Psychiatric:        Behavior: Behavior normal.     Comments: Well groomed, good eye contact, normal speech and thoughts     Results for orders placed or performed in visit on 02/08/23  POC Covid19/Flu A&B Antigen   Collection Time: 02/08/23 11:29 AM  Result Value Ref Range   Influenza A Antigen, POC Negative Negative   Influenza B Antigen, POC Negative Negative   Covid Antigen, POC Negative Negative      Assessment & Plan:   Problem List Items Addressed This Visit     Morbid obesity with BMI of 50.0-59.9, adult (HCC)   Muscle spasm of back   Relevant Medications   ibuprofen  (ADVIL ) 800 MG tablet   orphenadrine  (NORFLEX ) 100 MG tablet   Other Visit Diagnoses       Essential hypertension    -  Primary   Relevant Medications   spironolactone  (ALDACTONE ) 25 MG tablet   metoprolol  succinate (TOPROL -XL) 25 MG 24 hr tablet   lisinopril  (ZESTRIL ) 40 MG tablet   Other Relevant Orders   Comprehensive metabolic panel with GFR   CT CARDIAC SCORING (SELF PAY ONLY)     Candidal intertrigo       Relevant Medications   fluconazole  (DIFLUCAN ) 150 MG tablet     Itching         Acute non-recurrent frontal sinusitis       Relevant Medications    fluconazole  (DIFLUCAN ) 150 MG tablet   amoxicillin -clavulanate (AUGMENTIN ) 875-125 MG  tablet     Abnormal glucose       Relevant Orders   Hemoglobin A1c     Mixed hyperlipidemia       Relevant Medications   spironolactone  (ALDACTONE ) 25 MG tablet   metoprolol  succinate (TOPROL -XL) 25 MG 24 hr tablet   lisinopril  (ZESTRIL ) 40 MG tablet   Other Relevant Orders   Lipid panel   CT CARDIAC SCORING (SELF PAY ONLY)     Eczema, unspecified type       Relevant Medications   triamcinolone  cream (KENALOG ) 0.5 %     Strain of lumbar region, initial encounter       Relevant Medications   ibuprofen  (ADVIL ) 800 MG tablet   orphenadrine  (NORFLEX ) 100 MG tablet        Candidal intertrigo Persistent yeast infection under both breasts with significant itching and discomfort. Current treatment with triamcinolone  is ineffective. - Prescribed fluconazole  for yeast infection. - Prescribed stronger triamcinolone  cream (0.5%) in a jar.  Eczema with pruritus Severe itching and dry skin, particularly on the back, exacerbated by cold weather and dry air. Current treatment with triamcinolone  is ineffective. - Prescribed stronger triamcinolone  cream (0.5%) in a jar. - Daily moisturizer  Acute frontal sinusitis Sinus infection for two weeks with nasal congestion, epistaxis, and scabbing. Examination reveals scabbing and fluid behind the eardrum, likely related to sinusitis. - Prescribed amoxicillin  for sinus infection. - Recommended nasal saline moisturizer.  Essential hypertension Blood pressure well-controlled at 138/84 mmHg. No significant fluid retention noted, though mild swelling is present. - Continue current antihypertensive medications: metoprolol , spironolactone , lisinopril . - Provided 90-day refills for antihypertensive medications.  Morbid Obesity BMI >53 Chronic problem with obesity Discussed lifestyle modification diet management Consider medication options for weight loss  Mixed  hyperlipidemia Routine blood work is overdue. Previous cholesterol levels were normal. - Ordered blood work to check cholesterol levels.  Muscle spasm of back Muscle spasms in the back causing discomfort. - Prescribed ibuprofen  for muscle spasms. - Prescribed muscle relaxer.  Abnormal glucose Routine blood work is overdue. Previous blood sugar levels were normal. - Ordered blood work to check blood sugar levels.  General Health Maintenance Discussion about a heart scan to assess cardiovascular health. She is concerned about cost but informed about the affordability of the CT scan. - Ordered CT scan of the chest and arteries of the heart. - Scheduled CT scan for early morning to accommodate work schedule.         Orders Placed This Encounter  Procedures   CT CARDIAC SCORING (SELF PAY ONLY)    Standing Status:   Future    Expiration Date:   02/13/2025    Scheduling Instructions:     Needs earliest apt in AM for scheduling    Preferred imaging location?:   [facility]   Hemoglobin A1c   Comprehensive metabolic panel with GFR    Has the patient fasted?:   Yes   Lipid panel    Has the patient fasted?:   Yes    Meds ordered this encounter  Medications   fluconazole  (DIFLUCAN ) 150 MG tablet    Sig: Take 2 tablets (300 mg total) by mouth once a week. For 4 weeks for intertrigo    Dispense:  8 tablet    Refill:  1   spironolactone  (ALDACTONE ) 25 MG tablet    Sig: Take 1 tablet (25 mg total) by mouth daily.    Dispense:  90 tablet    Refill:  3   metoprolol   succinate (TOPROL -XL) 25 MG 24 hr tablet    Sig: Take 1 tablet (25 mg total) by mouth daily.    Dispense:  90 tablet    Refill:  3   lisinopril  (ZESTRIL ) 40 MG tablet    Sig: Take 1 tablet (40 mg total) by mouth daily.    Dispense:  90 tablet    Refill:  3   amoxicillin -clavulanate (AUGMENTIN ) 875-125 MG tablet    Sig: Take 1 tablet by mouth 2 (two) times daily. For 10 days    Dispense:  20 tablet    Refill:  1     Added refill for future recurrence only if needed   triamcinolone  cream (KENALOG ) 0.5 %    Sig: Apply 1 Application topically 2 (two) times daily. To affected areas, for up to 2 weeks as needed for dermatitis    Dispense:  454 g    Refill:  1   ibuprofen  (ADVIL ) 800 MG tablet    Sig: TAKE 1 TABLET BY MOUTH EVERY 8 HOURS AS NEEDED FOR MODERATE PAIN    Dispense:  90 tablet    Refill:  3   orphenadrine  (NORFLEX ) 100 MG tablet    Sig: Take 1 tablet (100 mg total) by mouth 2 (two) times daily.    Dispense:  180 tablet    Refill:  3    Follow up plan: Return in about 6 months (around 08/13/2024).   Marsa Officer, DO [facility] 02/14/2024, 8:09 AM     [1]  Current Outpatient Medications:    acetaminophen  (TYLENOL ) 500 MG tablet, Take 1,000 mg by mouth every 6 (six) hours as needed for moderate pain or headache., Disp: , Rfl:    amoxicillin -clavulanate (AUGMENTIN ) 875-125 MG tablet, Take 1 tablet by mouth 2 (two) times daily. For 10 days, Disp: 20 tablet, Rfl: 1   miconazole  (MICOTIN) 2 % cream, Apply 1 Application topically 2 (two) times daily., Disp: 198 g, Rfl: 1   triamcinolone  cream (KENALOG ) 0.5 %, Apply 1 Application topically 2 (two) times daily. To affected areas, for up to 2 weeks as needed for dermatitis, Disp: 454 g, Rfl: 1   fluconazole  (DIFLUCAN ) 150 MG tablet, Take 2 tablets (300 mg total) by mouth once a week. For 4 weeks for intertrigo, Disp: 8 tablet, Rfl: 1   ibuprofen  (ADVIL ) 800 MG tablet, TAKE 1 TABLET BY MOUTH EVERY 8 HOURS AS NEEDED FOR MODERATE PAIN, Disp: 90 tablet, Rfl: 3   lisinopril  (ZESTRIL ) 40 MG tablet, Take 1 tablet (40 mg total) by mouth daily., Disp: 90 tablet, Rfl: 3   metoprolol  succinate (TOPROL -XL) 25 MG 24 hr tablet, Take 1 tablet (25 mg total) by mouth daily., Disp: 90 tablet, Rfl: 3   orphenadrine  (NORFLEX ) 100 MG tablet, Take 1 tablet (100 mg total) by mouth 2 (two) times daily., Disp: 180  tablet, Rfl: 3   spironolactone  (ALDACTONE ) 25 MG tablet, Take 1 tablet (25 mg total) by mouth daily., Disp: 90 tablet, Rfl: 3  Current Facility-Administered Medications:    sodium chloride  flush (NS) 0.9 % injection 3 mL, 3 mL, Intravenous, Q12H, Agbor-Etang, [place], MD [2]  Social History Tobacco Use   Smoking status: Never   Smokeless tobacco: Never  Vaping Use   Vaping status: Never Used  Substance Use Topics   Alcohol use: Yes    Alcohol/week: 0.0 standard drinks of alcohol    Comment: rarely   Drug use: No   "

## 2024-02-27 ENCOUNTER — Other Ambulatory Visit: Payer: Self-pay
